# Patient Record
Sex: Female | Born: 1935
Health system: Southern US, Community
[De-identification: ages and names within clinical notes are randomized; demographics above are authoritative.]

## PROBLEM LIST (undated history)

## (undated) DIAGNOSIS — M545 Low back pain, unspecified: Secondary | ICD-10-CM

## (undated) DIAGNOSIS — R42 Dizziness and giddiness: Secondary | ICD-10-CM

## (undated) DIAGNOSIS — M949 Disorder of cartilage, unspecified: Secondary | ICD-10-CM

## (undated) DIAGNOSIS — K589 Irritable bowel syndrome without diarrhea: Secondary | ICD-10-CM

## (undated) DIAGNOSIS — E079 Disorder of thyroid, unspecified: Secondary | ICD-10-CM

## (undated) DIAGNOSIS — F02818 Dementia in other diseases classified elsewhere, unspecified severity, with other behavioral disturbance: Secondary | ICD-10-CM

## (undated) DIAGNOSIS — I10 Essential (primary) hypertension: Secondary | ICD-10-CM

## (undated) DIAGNOSIS — G309 Alzheimer's disease, unspecified: Secondary | ICD-10-CM

## (undated) DIAGNOSIS — M899 Disorder of bone, unspecified: Secondary | ICD-10-CM

## (undated) DIAGNOSIS — J209 Acute bronchitis, unspecified: Secondary | ICD-10-CM

## (undated) DIAGNOSIS — Z87440 Personal history of urinary (tract) infections: Secondary | ICD-10-CM

## (undated) DIAGNOSIS — E441 Mild protein-calorie malnutrition: Secondary | ICD-10-CM

## (undated) DIAGNOSIS — E785 Hyperlipidemia, unspecified: Secondary | ICD-10-CM

## (undated) DIAGNOSIS — F0280 Dementia in other diseases classified elsewhere without behavioral disturbance: Secondary | ICD-10-CM

## (undated) DIAGNOSIS — F411 Generalized anxiety disorder: Secondary | ICD-10-CM

## (undated) DIAGNOSIS — F429 Obsessive-compulsive disorder, unspecified: Secondary | ICD-10-CM

## (undated) DIAGNOSIS — F0281 Dementia in other diseases classified elsewhere with behavioral disturbance: Secondary | ICD-10-CM

## (undated) DIAGNOSIS — E550 Rickets, active: Secondary | ICD-10-CM

## (undated) DIAGNOSIS — F0390 Unspecified dementia without behavioral disturbance: Secondary | ICD-10-CM

## (undated) HISTORY — DX: Acute bronchitis, unspecified: J20.9

## (undated) HISTORY — DX: Dementia in other diseases classified elsewhere, unspecified severity, without behavioral disturbance, psychotic disturbance, mood disturbance, and anxiety: F02.80

## (undated) HISTORY — DX: Hyperlipidemia, unspecified: E78.5

## (undated) HISTORY — DX: Alzheimer's disease, unspecified: G30.9

## (undated) HISTORY — PX: PARTIAL HYSTERECTOMY: SHX80

## (undated) HISTORY — PX: REPLACEMENT TOTAL KNEE: SUR1224

## (undated) HISTORY — PX: THYROID SURGERY: SHX805

## (undated) HISTORY — DX: Mild protein-calorie malnutrition: E44.1

## (undated) HISTORY — DX: Generalized anxiety disorder: F41.1

## (undated) HISTORY — DX: Dizziness and giddiness: R42

## (undated) HISTORY — DX: Low back pain, unspecified: M54.50

## (undated) HISTORY — DX: Irritable bowel syndrome, unspecified: K58.9

## (undated) HISTORY — DX: Unspecified dementia, unspecified severity, without behavioral disturbance, psychotic disturbance, mood disturbance, and anxiety: F03.90

## (undated) HISTORY — DX: Disorder of bone, unspecified: M89.9

## (undated) HISTORY — DX: Low back pain: M54.5

## (undated) HISTORY — DX: Dementia in other diseases classified elsewhere, unspecified severity, with other behavioral disturbance: F02.818

## (undated) HISTORY — DX: Dementia in other diseases classified elsewhere with behavioral disturbance: F02.81

## (undated) HISTORY — DX: Personal history of urinary (tract) infections: Z87.440

## (undated) HISTORY — DX: Rickets, active: E55.0

## (undated) HISTORY — DX: Disorder of cartilage, unspecified: M94.9

## (undated) HISTORY — DX: Obsessive-compulsive disorder, unspecified: F42.9

---

## 1997-08-27 ENCOUNTER — Other Ambulatory Visit: Admission: RE | Admit: 1997-08-27 | Discharge: 1997-08-27 | Payer: Self-pay | Admitting: Gynecology

## 1997-10-23 ENCOUNTER — Ambulatory Visit (HOSPITAL_COMMUNITY): Admission: RE | Admit: 1997-10-23 | Discharge: 1997-10-23 | Payer: Self-pay | Admitting: Internal Medicine

## 1997-12-17 ENCOUNTER — Other Ambulatory Visit: Admission: RE | Admit: 1997-12-17 | Discharge: 1997-12-17 | Payer: Self-pay | Admitting: Gynecology

## 1998-04-23 ENCOUNTER — Ambulatory Visit (HOSPITAL_COMMUNITY): Admission: RE | Admit: 1998-04-23 | Discharge: 1998-04-23 | Payer: Self-pay | Admitting: *Deleted

## 1998-04-29 ENCOUNTER — Ambulatory Visit (HOSPITAL_COMMUNITY): Admission: RE | Admit: 1998-04-29 | Discharge: 1998-04-29 | Payer: Self-pay | Admitting: *Deleted

## 1998-04-29 ENCOUNTER — Encounter: Payer: Self-pay | Admitting: *Deleted

## 1998-09-14 ENCOUNTER — Encounter: Payer: Self-pay | Admitting: Internal Medicine

## 1998-09-14 ENCOUNTER — Ambulatory Visit (HOSPITAL_COMMUNITY): Admission: RE | Admit: 1998-09-14 | Discharge: 1998-09-14 | Payer: Self-pay | Admitting: Internal Medicine

## 1998-10-06 ENCOUNTER — Other Ambulatory Visit: Admission: RE | Admit: 1998-10-06 | Discharge: 1998-10-06 | Payer: Self-pay | Admitting: Gynecology

## 1999-01-30 ENCOUNTER — Emergency Department (HOSPITAL_COMMUNITY): Admission: EM | Admit: 1999-01-30 | Discharge: 1999-01-30 | Payer: Self-pay | Admitting: Emergency Medicine

## 1999-07-27 ENCOUNTER — Encounter: Admission: RE | Admit: 1999-07-27 | Discharge: 1999-10-25 | Payer: Self-pay | Admitting: Internal Medicine

## 1999-09-15 ENCOUNTER — Encounter: Payer: Self-pay | Admitting: Internal Medicine

## 1999-09-15 ENCOUNTER — Ambulatory Visit (HOSPITAL_COMMUNITY): Admission: RE | Admit: 1999-09-15 | Discharge: 1999-09-15 | Payer: Self-pay | Admitting: *Deleted

## 1999-11-08 ENCOUNTER — Other Ambulatory Visit: Admission: RE | Admit: 1999-11-08 | Discharge: 1999-11-08 | Payer: Self-pay | Admitting: Gynecology

## 1999-11-10 ENCOUNTER — Encounter: Admission: RE | Admit: 1999-11-10 | Discharge: 1999-11-10 | Payer: Self-pay | Admitting: Gynecology

## 1999-11-10 ENCOUNTER — Encounter: Payer: Self-pay | Admitting: Gynecology

## 2000-02-01 ENCOUNTER — Encounter: Payer: Self-pay | Admitting: Surgery

## 2000-02-01 ENCOUNTER — Encounter: Admission: RE | Admit: 2000-02-01 | Discharge: 2000-02-01 | Payer: Self-pay | Admitting: Surgery

## 2000-03-13 ENCOUNTER — Ambulatory Visit (HOSPITAL_COMMUNITY): Admission: RE | Admit: 2000-03-13 | Discharge: 2000-03-13 | Payer: Self-pay | Admitting: Surgery

## 2001-01-14 ENCOUNTER — Ambulatory Visit (HOSPITAL_COMMUNITY): Admission: RE | Admit: 2001-01-14 | Discharge: 2001-01-14 | Payer: Self-pay | Admitting: Gastroenterology

## 2001-01-22 ENCOUNTER — Other Ambulatory Visit: Admission: RE | Admit: 2001-01-22 | Discharge: 2001-01-22 | Payer: Self-pay | Admitting: Gynecology

## 2001-05-08 ENCOUNTER — Encounter: Payer: Self-pay | Admitting: Orthopedic Surgery

## 2001-05-13 ENCOUNTER — Inpatient Hospital Stay (HOSPITAL_COMMUNITY): Admission: RE | Admit: 2001-05-13 | Discharge: 2001-05-20 | Payer: Self-pay | Admitting: Orthopedic Surgery

## 2001-05-13 ENCOUNTER — Encounter: Payer: Self-pay | Admitting: Orthopedic Surgery

## 2001-05-16 ENCOUNTER — Encounter: Payer: Self-pay | Admitting: Orthopedic Surgery

## 2001-07-02 ENCOUNTER — Other Ambulatory Visit: Admission: RE | Admit: 2001-07-02 | Discharge: 2001-07-02 | Payer: Self-pay | Admitting: Gynecology

## 2002-04-15 ENCOUNTER — Encounter: Admission: RE | Admit: 2002-04-15 | Discharge: 2002-04-15 | Payer: Self-pay | Admitting: Gynecology

## 2002-04-15 ENCOUNTER — Encounter: Payer: Self-pay | Admitting: Gynecology

## 2002-11-02 ENCOUNTER — Emergency Department (HOSPITAL_COMMUNITY): Admission: EM | Admit: 2002-11-02 | Discharge: 2002-11-02 | Payer: Self-pay | Admitting: Emergency Medicine

## 2003-02-10 ENCOUNTER — Other Ambulatory Visit: Admission: RE | Admit: 2003-02-10 | Discharge: 2003-02-10 | Payer: Self-pay | Admitting: Gynecology

## 2003-06-17 ENCOUNTER — Encounter: Admission: RE | Admit: 2003-06-17 | Discharge: 2003-06-17 | Payer: Self-pay | Admitting: Gynecology

## 2004-01-04 ENCOUNTER — Encounter: Admission: RE | Admit: 2004-01-04 | Discharge: 2004-01-04 | Payer: Self-pay | Admitting: Internal Medicine

## 2004-03-07 ENCOUNTER — Other Ambulatory Visit: Admission: RE | Admit: 2004-03-07 | Discharge: 2004-03-07 | Payer: Self-pay | Admitting: Gynecology

## 2004-06-22 ENCOUNTER — Ambulatory Visit (HOSPITAL_COMMUNITY): Admission: RE | Admit: 2004-06-22 | Discharge: 2004-06-22 | Payer: Self-pay | Admitting: Gastroenterology

## 2004-07-15 ENCOUNTER — Encounter: Admission: RE | Admit: 2004-07-15 | Discharge: 2004-07-15 | Payer: Self-pay | Admitting: Internal Medicine

## 2004-10-08 ENCOUNTER — Emergency Department (HOSPITAL_COMMUNITY): Admission: EM | Admit: 2004-10-08 | Discharge: 2004-10-08 | Payer: Self-pay | Admitting: Emergency Medicine

## 2005-02-10 ENCOUNTER — Emergency Department (HOSPITAL_COMMUNITY): Admission: EM | Admit: 2005-02-10 | Discharge: 2005-02-11 | Payer: Self-pay | Admitting: Emergency Medicine

## 2006-08-04 ENCOUNTER — Emergency Department (HOSPITAL_COMMUNITY): Admission: EM | Admit: 2006-08-04 | Discharge: 2006-08-04 | Payer: Self-pay | Admitting: *Deleted

## 2009-03-03 LAB — HM COLONOSCOPY

## 2010-04-24 ENCOUNTER — Encounter: Payer: Self-pay | Admitting: Internal Medicine

## 2010-11-03 ENCOUNTER — Inpatient Hospital Stay (INDEPENDENT_AMBULATORY_CARE_PROVIDER_SITE_OTHER)
Admission: RE | Admit: 2010-11-03 | Discharge: 2010-11-03 | Disposition: A | Discharge status: Home / Self Care | Payer: Medicare Other | Source: Ambulatory Visit | Attending: Emergency Medicine | Admitting: Emergency Medicine

## 2010-11-03 DIAGNOSIS — R1013 Epigastric pain: Secondary | ICD-10-CM

## 2011-03-13 ENCOUNTER — Emergency Department (HOSPITAL_COMMUNITY)
Admission: EM | Admit: 2011-03-13 | Discharge: 2011-03-13 | Disposition: A | Discharge status: Home / Self Care | Payer: Medicare Other | Source: Home / Self Care

## 2011-06-27 ENCOUNTER — Emergency Department (INDEPENDENT_AMBULATORY_CARE_PROVIDER_SITE_OTHER)
Admission: EM | Admit: 2011-06-27 | Discharge: 2011-06-27 | Disposition: A | Discharge status: Home / Self Care | Payer: Medicare Other | Source: Home / Self Care

## 2011-06-27 ENCOUNTER — Encounter (HOSPITAL_COMMUNITY): Payer: Self-pay | Admitting: Emergency Medicine

## 2011-06-27 ENCOUNTER — Emergency Department (INDEPENDENT_AMBULATORY_CARE_PROVIDER_SITE_OTHER): Payer: Medicare Other

## 2011-06-27 DIAGNOSIS — J209 Acute bronchitis, unspecified: Secondary | ICD-10-CM

## 2011-06-27 HISTORY — DX: Dementia in other diseases classified elsewhere, unspecified severity, without behavioral disturbance, psychotic disturbance, mood disturbance, and anxiety: F02.80

## 2011-06-27 HISTORY — DX: Alzheimer's disease, unspecified: G30.9

## 2011-06-27 HISTORY — DX: Essential (primary) hypertension: I10

## 2011-06-27 HISTORY — DX: Irritable bowel syndrome, unspecified: K58.9

## 2011-06-27 HISTORY — DX: Disorder of thyroid, unspecified: E07.9

## 2011-06-27 MED ORDER — DOXYCYCLINE HYCLATE 100 MG PO CAPS: 100.0000 mg | ORAL_CAPSULE | Freq: Two times a day (BID) | ORAL | Status: AC

## 2011-06-27 MED ORDER — BENZONATATE 100 MG PO CAPS: ORAL_CAPSULE | ORAL | Status: AC

## 2011-06-27 NOTE — ED Notes (Signed)
Cough for 3 weeks.  Patient has been seen 3/15 and treated with z-pac and inhaler.  Her pcp cannot see until April.  Daughter reports patient is not eating as usual.  White phlegm sometimes, but production and the sound of congestion are not equal: congestion sounds worse than actual production of phlegm.

## 2011-06-27 NOTE — ED Notes (Signed)
Delay secondary to department acuity 

## 2011-06-27 NOTE — ED Provider Notes (Signed)
History     CSN: [id number]  Arrival date & time 06/27/11  1619   None     Chief Complaint  Patient presents with  . Cough    (Consider location/radiation/quality/duration/timing/severity/associated sxs/prior treatment) HPI Comments: This very pleasant Alzheimer's dementia patient presents today with her daughter. Daughter reports that she has had a cough for the last 3 weeks. She was seen on March 15 by her primary care physician and treated with a Z-Pak. Daughter states that despite treatment her cough is progressively worsening, deeper, and is nonproductive with a thick milky phlegm. No fever. Her appetite is decreased. She has chronic diarrhea do to IBS, this is unchanged. No vomiting.   Past Medical History  Diagnosis Date  . Thyroid disease   . Hypertension   . IBS (irritable bowel syndrome)   . Alzheimer disease     Past Surgical History  Procedure Date  . Thyroid surgery   . Partial hysterectomy   . Replacement total knee     bilateral    History reviewed. No pertinent family history.  History  Substance Use Topics  . Smoking status: Never Smoker   . Smokeless tobacco: Not on file  . Alcohol Use: No    OB History    Grav Para Term Preterm Abortions TAB SAB Ect Mult Living                  Review of Systems  Constitutional: Positive for appetite change. Negative for fever, chills and fatigue.  HENT: Negative for ear pain, congestion, sore throat and rhinorrhea.   Respiratory: Positive for cough. Negative for shortness of breath and wheezing.   Cardiovascular: Negative for chest pain.    Allergies  Review of patient's allergies indicates no known allergies.  Home Medications   Current Outpatient Rx  Name Route Sig Dispense Refill  . LEVOTHYROXINE SODIUM PO Oral Take by mouth.    Marland Kitchen LISINOPRIL-HYDROCHLOROTHIAZIDE 20-25 MG PO TABS Oral Take 1 tablet by mouth daily.    Marland Kitchen ZOLOFT PO Oral Take by mouth.    . BENZONATATE 100 MG PO CAPS  1-2 caps every 8  hrs prn cough 30 capsule 0  . DOXYCYCLINE HYCLATE 100 MG PO CAPS Oral Take 1 capsule (100 mg total) by mouth 2 (two) times daily. 20 capsule 0    BP 132/67  Pulse 70  Temp(Src) 98.9 F (37.2 C) (Oral)  Resp 18  SpO2 100%  Physical Exam  Nursing note and vitals reviewed. Constitutional: She appears well-developed and well-nourished. No distress.  HENT:  Head: Normocephalic and atraumatic.  Right Ear: Tympanic membrane, external ear and ear canal normal.  Left Ear: Tympanic membrane, external ear and ear canal normal.  Nose: Nose normal.  Mouth/Throat: Uvula is midline, oropharynx is clear and moist and mucous membranes are normal. No oropharyngeal exudate, posterior oropharyngeal edema or posterior oropharyngeal erythema.  Neck: Neck supple.  Cardiovascular: Normal rate, regular rhythm and normal heart sounds.   Pulmonary/Chest: Effort normal and breath sounds normal. No respiratory distress.  Lymphadenopathy:    She has no cervical adenopathy.  Neurological: She is alert.  Skin: Skin is warm and dry.  Psychiatric: She has a normal mood and affect.    ED Course  Procedures (including critical care time)  Labs Reviewed - No data to display Dg Chest 2 View  06/27/2011  *RADIOLOGY REPORT*  Clinical Data: Cough and congestion.  CHEST - 2 VIEW  Comparison: 08/04/2006 chest radiograph  Findings: Upper limits normal heart  size noted. This is a mildly low volume film with bibasilar atelectasis. There is no evidence of focal airspace disease, pulmonary edema, suspicious pulmonary nodule/mass, pleural effusion, or pneumothorax. No acute bony abnormalities are identified. Unchanged superior mediastinal prominence probably represents a goiter.  IMPRESSION: Mild bibasilar atelectasis.  Upper limits normal heart size.  Original Report Authenticated By: Rosendo Gros, M.D.     1. Acute bronchitis       MDM  CXR neg for infiltrates. Cough x 3 wks worsening. Exam neg.         Melody Comas, [place] 06/27/11 2016

## 2011-06-27 NOTE — ED Notes (Signed)
Patient denies pain and is resting comfortably.  

## 2011-06-27 NOTE — Discharge Instructions (Signed)
Chest xray neg for pneumonia. Complete antibiotic prescription. Take cough medication as needed. Increase fluids. Follow up with Dr Renato Gails next week.   Bronchitis Bronchitis is a problem of the air tubes leading to your lungs. This problem makes it hard for air to get in and out of the lungs. You may cough a lot because your air tubes are narrow. Going without care can cause lasting (chronic) bronchitis. HOME CARE   Drink enough fluids to keep your pee (urine) clear or pale yellow.   Use a cool mist humidifier.   Quit smoking if you smoke. If you keep smoking, the bronchitis might not get better.   Only take medicine as told by your doctor.  GET HELP RIGHT AWAY IF:   Coughing keeps you awake.   You start to wheeze.   You become more sick or weak.   You have a hard time breathing or get short of breath.   You cough up blood.   Coughing lasts more than 2 weeks.   You have a fever.   Your baby is older than 3 months with a rectal temperature of 102 F (38.9 C) or higher.   Your baby is 41 months old or younger with a rectal temperature of 100.4 F (38 C) or higher.  MAKE SURE YOU:  Understand these instructions.   Will watch your condition.   Will get help right away if you are not doing well or get worse.  Document Released: 09/06/2007 Document Revised: 03/09/2011 Document Reviewed: 02/19/2009 [facility] Patient Information 2012 [place], [place].

## 2011-06-27 NOTE — ED Provider Notes (Signed)
Medical screening examination/treatment/procedure(s) were performed by non-physician practitioner and as supervising physician I was immediately available for consultation/collaboration.  Alen Bleacher, MD 06/27/11 2238

## 2012-02-12 ENCOUNTER — Encounter (HOSPITAL_COMMUNITY): Payer: Self-pay | Admitting: *Deleted

## 2012-02-12 ENCOUNTER — Emergency Department (HOSPITAL_COMMUNITY)
Admission: EM | Admit: 2012-02-12 | Discharge: 2012-02-12 | Disposition: A | Discharge status: Home / Self Care | Payer: Medicare Other | Attending: Emergency Medicine | Admitting: Emergency Medicine

## 2012-02-12 ENCOUNTER — Emergency Department (HOSPITAL_COMMUNITY): Payer: Medicare Other

## 2012-02-12 DIAGNOSIS — S0101XA Laceration without foreign body of scalp, initial encounter: Secondary | ICD-10-CM

## 2012-02-12 DIAGNOSIS — Z79899 Other long term (current) drug therapy: Secondary | ICD-10-CM | POA: Insufficient documentation

## 2012-02-12 DIAGNOSIS — Y929 Unspecified place or not applicable: Secondary | ICD-10-CM | POA: Insufficient documentation

## 2012-02-12 DIAGNOSIS — I10 Essential (primary) hypertension: Secondary | ICD-10-CM | POA: Insufficient documentation

## 2012-02-12 DIAGNOSIS — Y9389 Activity, other specified: Secondary | ICD-10-CM | POA: Insufficient documentation

## 2012-02-12 DIAGNOSIS — F0280 Dementia in other diseases classified elsewhere without behavioral disturbance: Secondary | ICD-10-CM | POA: Insufficient documentation

## 2012-02-12 DIAGNOSIS — G309 Alzheimer's disease, unspecified: Secondary | ICD-10-CM | POA: Insufficient documentation

## 2012-02-12 DIAGNOSIS — W1809XA Striking against other object with subsequent fall, initial encounter: Secondary | ICD-10-CM | POA: Insufficient documentation

## 2012-02-12 DIAGNOSIS — Z8719 Personal history of other diseases of the digestive system: Secondary | ICD-10-CM | POA: Insufficient documentation

## 2012-02-12 DIAGNOSIS — Z23 Encounter for immunization: Secondary | ICD-10-CM | POA: Insufficient documentation

## 2012-02-12 DIAGNOSIS — S0100XA Unspecified open wound of scalp, initial encounter: Secondary | ICD-10-CM | POA: Insufficient documentation

## 2012-02-12 DIAGNOSIS — W19XXXA Unspecified fall, initial encounter: Secondary | ICD-10-CM

## 2012-02-12 DIAGNOSIS — E079 Disorder of thyroid, unspecified: Secondary | ICD-10-CM | POA: Insufficient documentation

## 2012-02-12 MED ORDER — TETANUS-DIPHTH-ACELL PERTUSSIS 5-2.5-18.5 LF-MCG/0.5 IM SUSP
0.5000 mL | Freq: Once | INTRAMUSCULAR | Status: AC
  Administered 2012-02-12: 0.5 mL via INTRAMUSCULAR
  Filled 2012-02-12: qty 0.5

## 2012-02-12 NOTE — ED Provider Notes (Signed)
History     CSN: [id number]  Arrival date & time 02/12/12  1905   First MD Initiated Contact with Patient 02/12/12 2148      Chief Complaint  Patient presents with  . Fall  . Head Laceration    (Consider location/radiation/quality/duration/timing/severity/associated sxs/prior treatment) Patient is a 77 y.o. female presenting with fall and scalp laceration. The history is provided by a relative. The history is limited by the condition of the patient (dementia).  Fall  Head Laceration  She went to sit down on the sofa and missed the sofa and fell striking her head on a coffee table. She several laceration. It is not known when her last tetanus immunization was. There was no loss of consciousness and her mental status is at her baseline according to her daughter. She has not had any nausea or vomiting.  Past Medical History  Diagnosis Date  . Thyroid disease   . Hypertension   . IBS (irritable bowel syndrome)   . Alzheimer disease     Past Surgical History  Procedure Date  . Thyroid surgery   . Partial hysterectomy   . Replacement total knee     bilateral    History reviewed. No pertinent family history.  History  Substance Use Topics  . Smoking status: Never Smoker   . Smokeless tobacco: Not on file  . Alcohol Use: No    OB History    Grav Para Term Preterm Abortions TAB SAB Ect Mult Living                  Review of Systems  Unable to perform ROS: Dementia    Allergies  Review of patient's allergies indicates no known allergies.  Home Medications   Current Outpatient Rx  Name  Route  Sig  Dispense  Refill  . LEVOTHYROXINE SODIUM 100 MCG PO TABS   Oral   Take 100 mcg by mouth daily.         Marland Kitchen LISINOPRIL-HYDROCHLOROTHIAZIDE 20-25 MG PO TABS   Oral   Take 1 tablet by mouth daily.         . SERTRALINE HCL 100 MG PO TABS   Oral   Take 100 mg by mouth daily.           BP 128/90  Pulse 79  Temp 98.1 F (36.7 C) (Oral)  Resp 16  SpO2  97%  Physical Exam  Nursing note and vitals reviewed. 77 year old female, resting comfortably and in no acute distress. Vital signs are normal. Oxygen saturation is 97%, which is normal. Head is normocephalic. There is a 1 cm laceration on the occiput with no other evidence of trauma present. PERRLA, EOMI. Oropharynx is clear. Neck is nontender and supple without adenopathy or JVD. Back is nontender and there is no CVA tenderness. Lungs are clear without rales, wheezes, or rhonchi. Chest is nontender. Heart has regular rate and rhythm without murmur. Abdomen is soft, flat, nontender without masses or hepatosplenomegaly and peristalsis is normoactive. Extremities have no cyanosis or edema, full range of motion is present. Skin is warm and dry without rash. Neurologic:  she is awake and alert, oriented to person but not place or time, cranial nerves are intact, there are no motor or sensory deficits.   ED Course  Procedures (including critical care time)  Ct Head Wo Contrast  02/12/2012  *RADIOLOGY REPORT*  Clinical Data: Fall, head laceration.  CT HEAD WITHOUT CONTRAST  Technique:  Contiguous axial images were obtained  from the base of the skull through the vertex without contrast.  Comparison: None.  Findings: There is atrophy and chronic small vessel disease changes.  Associated ventriculomegaly. No acute intracranial abnormality.  Specifically, no hemorrhage, hydrocephalus, mass lesion, acute infarction, or significant intracranial injury.  No acute calvarial abnormality. Visualized paranasal sinuses and mastoids clear.  Orbital soft tissues unremarkable.  IMPRESSION: No acute intracranial abnormality.  Atrophy, chronic microvascular disease.   Original Report Authenticated By: Charlett Nose, M.D.    LACERATION REPAIR Performed by: [name] Authorized by: [name] Consent: Verbal consent obtained. Risks and benefits: risks, benefits and alternatives were discussed Consent given by:  patient Patient identity confirmed: provided demographic data Prepped and Draped in normal sterile fashion Wound explored  Laceration Location: scalp  Laceration Length: 1.0 cm  No Foreign Bodies seen or palpated  Anesthesia: none  Amount of cleaning: standard  Skin closure: close  Number of staples: 2  Technique: stapling  Patient tolerance: Patient tolerated the procedure well with no immediate complications.   1. Fall   2. Laceration of scalp       MDM  Fall with scalp laceration.TdAP booster is given. CT scan will be obtained and a laceration repaired time after scan has been completed.        [name], MD 02/13/12 [phone]

## 2012-02-12 NOTE — ED Notes (Addendum)
Pt has alzhiemers. Pt daughter was cooking and pt fell from a sitting position and hit head on end of coffee table. Pt has small laceration to back of head, bleeding controlled. Pt per normal per family, no LOC.

## 2012-02-25 ENCOUNTER — Emergency Department (HOSPITAL_COMMUNITY)
Admission: EM | Admit: 2012-02-25 | Discharge: 2012-02-25 | Disposition: A | Discharge status: Home / Self Care | Payer: Medicare Other | Attending: Emergency Medicine | Admitting: Emergency Medicine

## 2012-02-25 ENCOUNTER — Encounter (HOSPITAL_COMMUNITY): Payer: Self-pay | Admitting: *Deleted

## 2012-02-25 DIAGNOSIS — I10 Essential (primary) hypertension: Secondary | ICD-10-CM | POA: Insufficient documentation

## 2012-02-25 DIAGNOSIS — E079 Disorder of thyroid, unspecified: Secondary | ICD-10-CM | POA: Insufficient documentation

## 2012-02-25 DIAGNOSIS — G309 Alzheimer's disease, unspecified: Secondary | ICD-10-CM | POA: Insufficient documentation

## 2012-02-25 DIAGNOSIS — F0280 Dementia in other diseases classified elsewhere without behavioral disturbance: Secondary | ICD-10-CM | POA: Insufficient documentation

## 2012-02-25 DIAGNOSIS — Z8719 Personal history of other diseases of the digestive system: Secondary | ICD-10-CM | POA: Insufficient documentation

## 2012-02-25 DIAGNOSIS — Z79899 Other long term (current) drug therapy: Secondary | ICD-10-CM | POA: Insufficient documentation

## 2012-02-25 DIAGNOSIS — Z4802 Encounter for removal of sutures: Secondary | ICD-10-CM | POA: Insufficient documentation

## 2012-02-25 NOTE — ED Notes (Signed)
2 staples intact in back of scalp, clean and dry- no drainage noted.

## 2012-02-25 NOTE — ED Provider Notes (Signed)
History  This chart was scribed for Candice Lyons, MD by Shari Heritage, ED Scribe. The patient was seen in room TR04C/TR04C. Patient's care was started at 1643.  CSN: [id number]  Arrival date & time 02/25/12  1613   First MD Initiated Contact with Patient 02/25/12 1643      Chief Complaint  Patient presents with  . Suture / Staple Removal    The history is provided by the patient. No language interpreter was used.    HPI Comments: Candice Woodward is a 77 y.o. female who presents to the Emergency Department requesting staple removalfrom a laceration site on the back of her head. Patient fell and sustained a laceration 2 weeks ago and staples were placed by Dr. Preston Fleeting on 02/12/12. [name] states that she has cleaned the area with peroxide and that there has been a small amount of non-purulent, bloody discharge from the area. Patient has a history of thyroid disease, HTN, IBS and Alzheimer's. Patient does not smoke.   Past Medical History  Diagnosis Date  . Thyroid disease   . Hypertension   . IBS (irritable bowel syndrome)   . Alzheimer disease     Past Surgical History  Procedure Date  . Thyroid surgery   . Partial hysterectomy   . Replacement total knee     bilateral    No family history on file.  History  Substance Use Topics  . Smoking status: Never Smoker   . Smokeless tobacco: Not on file  . Alcohol Use: No    OB History    Grav Para Term Preterm Abortions TAB SAB Ect Mult Living                  Review of Systems  Skin: Positive for wound.  All other systems reviewed and are negative.    Allergies  Review of patient's allergies indicates no known allergies.  Home Medications   Current Outpatient Rx  Name  Route  Sig  Dispense  Refill  . DIVALPROEX SODIUM 125 MG PO CPSP   Oral   Take 125 mg by mouth daily.         Marland Kitchen LEVOTHYROXINE SODIUM 100 MCG PO TABS   Oral   Take 100 mcg by mouth daily.         Marland Kitchen LISINOPRIL-HYDROCHLOROTHIAZIDE  20-25 MG PO TABS   Oral   Take 1 tablet by mouth daily.         . SERTRALINE HCL 100 MG PO TABS   Oral   Take 100 mg by mouth daily.           Triage Vitals: BP 139/81  Pulse 80  Temp 97.8 F (36.6 C) (Oral)  Resp 16  SpO2 99%  Physical Exam  Constitutional: She is oriented to person, place, and time. She appears well-developed and well-nourished. No distress.  HENT:  Head: Normocephalic and atraumatic.       2 staples on the posterior scalp. No redness. No purulent discharge.  Eyes: EOM are normal.  Cardiovascular: Normal rate.   Pulmonary/Chest: Effort normal.  Musculoskeletal: Normal range of motion.  Neurological: She is alert and oriented to person, place, and time.  Skin: Skin is warm and dry. No rash noted.  Psychiatric: She has a normal mood and affect. Her behavior is normal.    ED Course  Procedures (including critical care time) DIAGNOSTIC STUDIES: Oxygen Saturation is 99% on room air, normal by my interpretation.    COORDINATION OF CARE:  4:48 PM- Patient informed of current plan for treatment and evaluation and agrees with plan at this time. Removed two staples from the back of the head.  STAPLE REMOVAL Performed by: Candice Lyons, MD AT 4:48 pm Consent: Verbal consent obtained. Consent given by: patient Required items: required blood products, implants, devices, and special equipment available  Time out: Immediately prior to procedure a "time out" was called to verify the correct patient, procedure, equipment, support staff and site/side marked as required. Location: posterior scalp Wound Appearance: clean, no erythema  Staples Removed: 2 Patient tolerance: Patient tolerated the procedure well with no immediate complications.    No diagnosis found.    MDM  I personally performed the services described in this documentation, which was scribed in my presence. The recorded information has been reviewed and is  accurate.            Candice Lyons, MD 02/25/12 [phone]

## 2012-02-25 NOTE — ED Notes (Signed)
Patient is here for staple removal from her head.  Placed 2 weeks ago.  No changes in neuro status

## 2012-06-24 ENCOUNTER — Other Ambulatory Visit: Payer: Self-pay | Admitting: *Deleted

## 2012-06-24 MED ORDER — VITAMIN D (ERGOCALCIFEROL) 1.25 MG (50000 UNIT) PO CAPS: ORAL_CAPSULE | ORAL | Status: DC

## 2012-12-12 ENCOUNTER — Encounter: Payer: Self-pay | Admitting: *Deleted

## 2012-12-16 ENCOUNTER — Ambulatory Visit (INDEPENDENT_AMBULATORY_CARE_PROVIDER_SITE_OTHER): Payer: Medicare Other | Admitting: Internal Medicine

## 2012-12-16 VITALS — BP 150/65 | HR 112 | Temp 97.4°F | Resp 18 | Wt 159.2 lb

## 2012-12-16 DIAGNOSIS — I10 Essential (primary) hypertension: Secondary | ICD-10-CM

## 2012-12-16 DIAGNOSIS — E079 Disorder of thyroid, unspecified: Secondary | ICD-10-CM | POA: Insufficient documentation

## 2012-12-16 DIAGNOSIS — K589 Irritable bowel syndrome without diarrhea: Secondary | ICD-10-CM

## 2012-12-16 DIAGNOSIS — Z23 Encounter for immunization: Secondary | ICD-10-CM

## 2012-12-16 DIAGNOSIS — E441 Mild protein-calorie malnutrition: Secondary | ICD-10-CM | POA: Insufficient documentation

## 2012-12-16 DIAGNOSIS — F0280 Dementia in other diseases classified elsewhere without behavioral disturbance: Secondary | ICD-10-CM | POA: Insufficient documentation

## 2012-12-16 NOTE — Patient Instructions (Signed)
Referred to someone who can help you navigate the system of finding a facility.

## 2012-12-16 NOTE — Progress Notes (Signed)
Patient ID: Candice Woodward, female   DOB: [birth year], 78 y.o.   MRN: [id number] Location:  [facility] [place] [facility]  Code Status: DNR   No Known Allergies  Chief Complaint  Patient presents with  . Follow-up    HPI: Patient is a 78 y.o. black female seen in the office today for management chronic illnesses. Pt. No longer taking vitamin D or multi-vitamin d/t the size of the pill. No changes in mood from last visit, still taking zoloft.  BP has remained stable with lisinopril/hctz.  Daughter reports poor appetite, currently does not have dentures d/t patient feeding them to the dog. Still not sleeping well, will wander around the house during the night and occasionally will pull down her pants and use the bathroom beside the bed. Still hallucinating, mostly related to things she sees on TV or stating that someone said something and they didn't. Still becomes very combative when mentioning bathing. Walking is not stable but no recent falls. Daughter is considering placement, she is the sole caregiver and states her stress level is "out the roof." Feels that she can no longer give her the care she needs. She is quite fearful of staff at a facility not providing good enough care or getting into a conflict with her mom who can be combative at times, tends to be incontinent in innapropriate places and even put stool on the walls at times.     Review of Systems:  Review of Systems  Constitutional: Negative for weight loss.       Since 2011 pt. Has lost 20lbs  Respiratory: Negative for shortness of breath.   Cardiovascular: Negative for chest pain.  Gastrointestinal: Negative for diarrhea and constipation.  Psychiatric/Behavioral: Positive for hallucinations and memory loss.       Combative and verbally abusive at times.     Past Medical History  Diagnosis Date  . Thyroid disease   . Hypertension   . IBS (irritable bowel syndrome)   . Alzheimer disease   . Lumbago    . Acute bronchitis   . Rickets, active   . Disorder of bone and cartilage, unspecified   . Malnutrition of mild degree   . Other and unspecified hyperlipidemia   . Senile dementia, uncomplicated   . Dementia in conditions classified elsewhere with behavioral disturbance(294.11)   . Dementia in conditions classified elsewhere with behavioral disturbance(294.11)   . Anxiety state, unspecified   . Obsessive-compulsive disorders   . Alzheimer's disease   . Irritable bowel syndrome   . Dizziness and giddiness   . Personal history of urinary (tract) infection     Past Surgical History  Procedure Laterality Date  . Thyroid surgery    . Partial hysterectomy    . Replacement total knee      bilateral    Social History:   reports that she has never smoked. She does not have any smokeless tobacco history on file. She reports that she does not drink alcohol or use illicit drugs.  Family History  Problem Relation Age of Onset  . Aneurysm Mother     abdominal aortic  . Cancer Father     bone  . Cancer Sister     colon  . Diabetes Sister     Medications: Patient's Medications  New Prescriptions   No medications on file  Previous Medications   DIVALPROEX (DEPAKOTE SPRINKLE) 125 MG CAPSULE    Take 125 mg by mouth daily. Take twice  daily  for behavior.   LEVOTHYROXINE (SYNTHROID, LEVOTHROID) 100 MCG TABLET    Take 100 mcg by mouth daily. Take 1 tablet daily for thyroid supplement.   LISINOPRIL-HYDROCHLOROTHIAZIDE (PRINZIDE,ZESTORETIC) 20-25 MG PER TABLET    Take 1 tablet by mouth daily. Take 1 tablet by mouth daily for blood pressure.   MULTIPLE VITAMINS-MINERALS (MULTIVITAMIN WITH MINERALS) TABLET    Take 1 tablet by mouth daily.   SERTRALINE (ZOLOFT) 100 MG TABLET    Take 200 mg by mouth daily.    VITAMIN D, ERGOCALCIFEROL, (DRISDOL) 50000 UNITS CAPS    Take one tablet once a week for 12 weeks, then stop and start Vitamin d 2000 units once a day.  Modified Medications   No  medications on file  Discontinued Medications   No medications on file     Physical Exam: Filed Vitals:   12/16/12 1549  BP: 150/65  Pulse: 112  Temp: 97.4 F (36.3 C)  TempSrc: Oral  Resp: 18  Weight: 159 lb 3.2 oz (72.213 kg)  SpO2: 95%   Physical Exam  Constitutional: She appears well-developed and well-nourished.  Cardiovascular: Normal rate, regular rhythm, normal heart sounds and intact distal pulses.   Pulmonary/Chest: Effort normal and breath sounds normal.  Abdominal: Soft. Bowel sounds are normal.  Neurological: She is alert.  Skin: Skin is warm and dry.    Assessment/Plan 1. Alzheimer disease -severe, but not end stage--remains ambulatory, but is losing weight and having more failure to thrive features/frailty -information provided for assistant to help her daughter search for an appropriate memory care facility if she decides on this -continue adult enrichment center visits to help with caregiver stress, as well  2. Irritable bowel syndrome -especially with lactose containing products limiting her options for supplementing her diet--cannot take milkshakes or ice cream  3. Malnutrition of mild degree -due to failure to thrive from dementia -recommended increasing frequency of snacks to help maintain weight b/c she does not eat large meals--try increasing am oatmeal to three packets and add peanut butter to it  4. Thyroid disease -f/u labs next visit  5. Hypertension -at goal, no changes  6. Need for immunization against influenza - Flu Vaccine QUAD 36+ mos IM  Labs/tests ordered:  Will f/u depakote level and tsh next visit Next appt:  6 mos unless needed sooner

## 2012-12-17 ENCOUNTER — Encounter: Payer: Self-pay | Admitting: Internal Medicine

## 2012-12-17 DIAGNOSIS — Z23 Encounter for immunization: Secondary | ICD-10-CM

## 2013-03-20 ENCOUNTER — Other Ambulatory Visit: Payer: Self-pay | Admitting: Internal Medicine

## 2013-06-15 ENCOUNTER — Other Ambulatory Visit: Payer: Self-pay | Admitting: Internal Medicine

## 2013-06-22 ENCOUNTER — Other Ambulatory Visit: Payer: Self-pay | Admitting: Internal Medicine

## 2013-06-23 ENCOUNTER — Other Ambulatory Visit: Payer: Self-pay | Admitting: *Deleted

## 2013-08-08 ENCOUNTER — Encounter: Payer: Self-pay | Admitting: Internal Medicine

## 2013-08-08 ENCOUNTER — Ambulatory Visit (INDEPENDENT_AMBULATORY_CARE_PROVIDER_SITE_OTHER): Payer: Medicare Other | Admitting: Internal Medicine

## 2013-08-08 VITALS — BP 136/70 | HR 81 | Temp 97.8°F | Ht 66.5 in | Wt 166.0 lb

## 2013-08-08 DIAGNOSIS — I10 Essential (primary) hypertension: Secondary | ICD-10-CM

## 2013-08-08 DIAGNOSIS — K589 Irritable bowel syndrome without diarrhea: Secondary | ICD-10-CM

## 2013-08-08 DIAGNOSIS — E039 Hypothyroidism, unspecified: Secondary | ICD-10-CM | POA: Insufficient documentation

## 2013-08-08 DIAGNOSIS — F0280 Dementia in other diseases classified elsewhere without behavioral disturbance: Secondary | ICD-10-CM

## 2013-08-08 DIAGNOSIS — G309 Alzheimer's disease, unspecified: Principal | ICD-10-CM

## 2013-08-08 MED ORDER — LISINOPRIL-HYDROCHLOROTHIAZIDE 20-25 MG PO TABS: ORAL_TABLET | ORAL | Status: DC

## 2013-08-08 MED ORDER — DIVALPROEX SODIUM 125 MG PO CPSP: ORAL_CAPSULE | ORAL | Status: DC

## 2013-08-08 MED ORDER — LEVOTHYROXINE SODIUM 100 MCG PO TABS: ORAL_TABLET | ORAL | Status: DC

## 2013-08-08 MED ORDER — SERTRALINE HCL 100 MG PO TABS: ORAL_TABLET | ORAL | Status: DC

## 2013-08-08 NOTE — Progress Notes (Signed)
Patient ID: Candice Woodward, female   DOB: 02/23/1935, 79 y.o.   MRN: [id number]   Location:  [facility]  Code Status: DNR, her daughter is her HCPOA  No Known Allergies  Chief Complaint  Patient presents with  . Medical Management of Chronic Issues    6 month f/u with no recent labs  . other    still unstable in the mornings due to stiffiness; ? change in Levothyrine due to hair loss  . Immunizations    will check records for Pneumo    HPI: Patient is a 79 y.o. black female seen in the office today for medical mgt of chronic diseases especially her Alzheimer's disease--she does not accept that she is the patient so her daughter and I have to talk about her as someone else (usually her grandson).  She continues to be stiff in the mornings.  She has been having trouble with her hair falling out.  Sometimes doesn't swallow all of her pills and then they don't get into her system.    Gained a little weight.  Likes to snack.    Stiff in the mornings after sleeping curled up.     Review of Systems:  Review of Systems  Constitutional: Negative for fever, chills and weight loss.  HENT: Negative for congestion.   Eyes: Negative for blurred vision.  Respiratory: Positive for cough. Negative for shortness of breath.   Cardiovascular: Negative for chest pain and leg swelling.  Gastrointestinal: Negative for diarrhea and constipation.  Genitourinary: Negative for dysuria.  Musculoskeletal: Negative for falls.  Skin: Negative for rash.  Neurological: Negative for dizziness and loss of consciousness.  Psychiatric/Behavioral: Positive for depression and memory loss.       Mood stable lately     Past Medical History  Diagnosis Date  . Thyroid disease   . Hypertension   . IBS (irritable bowel syndrome)   . Alzheimer disease   . Lumbago   . Acute bronchitis   . Rickets, active   . Disorder of bone and cartilage, unspecified   . Malnutrition of  mild degree   . Other and unspecified hyperlipidemia   . Senile dementia, uncomplicated   . Dementia in conditions classified elsewhere with behavioral disturbance   . Dementia in conditions classified elsewhere with behavioral disturbance   . Anxiety state, unspecified   . Obsessive-compulsive disorders   . Alzheimer's disease   . Irritable bowel syndrome   . Dizziness and giddiness   . Personal history of urinary (tract) infection     Past Surgical History  Procedure Laterality Date  . Thyroid surgery    . Partial hysterectomy    . Replacement total knee      bilateral    Social History:   reports that she has never smoked. She does not have any smokeless tobacco history on file. She reports that she does not drink alcohol or use illicit drugs.  Family History  Problem Relation Age of Onset  . Aneurysm Mother     abdominal aortic  . Cancer Father     bone  . Cancer Sister     colon  . Diabetes Sister     Medications: Patient's Medications  New Prescriptions   No medications on file  Previous Medications   DIVALPROEX (DEPAKOTE SPRINKLE) 125 MG CAPSULE    TAKE ONE CAPSULE BY MOUTH TWICE A DAY FOR BEHAVIOR   LEVOTHYROXINE (SYNTHROID, LEVOTHROID) 100 MCG TABLET  TAKE 1 TABLET BY MOUTH FOR THYROID SUPPLEMENT   LISINOPRIL-HYDROCHLOROTHIAZIDE (PRINZIDE,ZESTORETIC) 20-25 MG PER TABLET    1 by mouth daily   MULTIPLE VITAMINS-MINERALS (MULTIVITAMIN WITH MINERALS) TABLET    Take 1 tablet by mouth daily.   SERTRALINE (ZOLOFT) 100 MG TABLET    TAKE 2 TABLETS BY MOUTH EVERY DAY   VITAMIN D, ERGOCALCIFEROL, (DRISDOL) 50000 UNITS CAPS    Take one tablet once a week for 12 weeks, then stop and start Vitamin d 2000 units once a day.  Modified Medications   No medications on file  Discontinued Medications   No medications on file     Physical Exam: Filed Vitals:   08/08/13 0817  BP: 136/70  Pulse: 81  Temp: 97.8 F (36.6 C)  TempSrc: Oral  Height: 5' 6.5" (1.689 m)    Weight: 166 lb (75.297 kg)  SpO2: 95%  Physical Exam  Constitutional: No distress.  Cardiovascular: Normal rate, regular rhythm, normal heart sounds and intact distal pulses.   Pulmonary/Chest: Effort normal and breath sounds normal.  Few scattered rhonchi  Abdominal: Soft. Bowel sounds are normal. She exhibits no distension and no mass. There is no tenderness.  Neurological: She is alert.  Oriented to person only  Skin: Skin is warm and dry.  Psychiatric:  Mood fluctuates quickly--at times, she is very agitated, combative, other times very sweet and calm    Labs reviewed: No recent labs  Assessment/Plan 1. Alzheimer disease with behaviors and depression -has been stable, [place] know [place] is in [place], still knows her daughter and grandson -her daughter has significant caregiver stress with anxiety and depression, but does not feel comfortable placing her mom in a facility  2. Irritable bowel syndrome -has been better lately with diarrhea  3. Hypothyroidism -will f/u TSH due to hair loss and question of whether medication is really getting into her system daily due to her dementia (does not always swallow pills)  4. Hypertension -at goal with current therapy  Labs/tests ordered: Orders Placed This Encounter  Procedures  . TSH  . CBC With differential/Platelet  . Comprehensive metabolic panel    Next appt:  6 mos for med mgt chr diseases

## 2013-08-09 LAB — CBC WITH DIFFERENTIAL
Basophils Absolute: 0 10*3/uL (ref 0.0–0.2)
Basos: 1 %
Eos: 4 %
Eosinophils Absolute: 0.2 10*3/uL (ref 0.0–0.4)
HCT: 39.9 % (ref 34.0–46.6)
Hemoglobin: 13.3 g/dL (ref 11.1–15.9)
Immature Grans (Abs): 0 10*3/uL (ref 0.0–0.1)
Immature Granulocytes: 0 %
Lymphocytes Absolute: 1.2 10*3/uL (ref 0.7–3.1)
Lymphs: 23 %
MCH: 29.4 pg (ref 26.6–33.0)
MCHC: 33.3 g/dL (ref 31.5–35.7)
MCV: 88 fL (ref 79–97)
Monocytes Absolute: 0.3 10*3/uL (ref 0.1–0.9)
Monocytes: 6 %
Neutrophils Absolute: 3.5 10*3/uL (ref 1.4–7.0)
Neutrophils Relative %: 66 %
Platelets: 246 10*3/uL (ref 150–379)
RBC: 4.52 x10E6/uL (ref 3.77–5.28)
RDW: 14.7 % (ref 12.3–15.4)
WBC: 5.3 10*3/uL (ref 3.4–10.8)

## 2013-08-09 LAB — COMPREHENSIVE METABOLIC PANEL
ALT: 12 IU/L (ref 0–32)
AST: 21 IU/L (ref 0–40)
Albumin/Globulin Ratio: 1.4 (ref 1.1–2.5)
Albumin: 3.9 g/dL (ref 3.5–4.8)
Alkaline Phosphatase: 84 IU/L (ref 39–117)
BUN/Creatinine Ratio: 13 (ref 11–26)
BUN: 14 mg/dL (ref 8–27)
CO2: 24 mmol/L (ref 18–29)
Calcium: 9.6 mg/dL (ref 8.7–10.3)
Chloride: 102 mmol/L (ref 97–108)
Creatinine, Ser: 1.09 mg/dL — ABNORMAL HIGH (ref 0.57–1.00)
GFR calc Af Amer: 57 mL/min/{1.73_m2} — ABNORMAL LOW (ref >59)
GFR calc non Af Amer: 49 mL/min/{1.73_m2} — ABNORMAL LOW (ref >59)
Globulin, Total: 2.8 g/dL (ref 1.5–4.5)
Glucose: 98 mg/dL (ref 65–99)
Potassium: 3.9 mmol/L (ref 3.5–5.2)
Sodium: 144 mmol/L (ref 134–144)
Total Bilirubin: 0.3 mg/dL (ref 0.0–1.2)
Total Protein: 6.7 g/dL (ref 6.0–8.5)

## 2013-08-09 LAB — TSH: TSH: 9.03 u[IU]/mL — ABNORMAL HIGH (ref 0.450–4.500)

## 2013-08-12 ENCOUNTER — Encounter: Payer: Self-pay | Admitting: *Deleted

## 2013-12-31 ENCOUNTER — Telehealth: Payer: Self-pay | Admitting: Internal Medicine

## 2013-12-31 NOTE — Telephone Encounter (Signed)
Bland SpanLois Woodward daughter dropped off a day care form to be filled out by Dr. Renato Gailseed. The form was put in the rx box on 12/31/2013.

## 2013-12-31 NOTE — Telephone Encounter (Signed)
Filled out portion that I could fill out and left for Dr. Renato Gailseed to review and sign.

## 2014-01-08 NOTE — Telephone Encounter (Signed)
Daughter stopped by the off and left Dr. Renato Gailseed a message stating "Help Please, Mom attends adult day care and if her physical and paperwork are not returned by 01/24/2214, they will suspend her care. I am not able to pay a private sitter because non cannot be left alone all day. She requires constant observation"  Given note to Dr. Renato Gailseed to address.

## 2014-01-08 NOTE — Telephone Encounter (Signed)
Candice Woodward Spoke with patient due to Dr. Renato Gailseed informed her to have daughter drop off paperwork and she will fill it out for patient but they are to keep the appointment in November.

## 2014-01-09 NOTE — Telephone Encounter (Signed)
Paperwork fill out completely and left for Dr. Renato Gailseed to sign.

## 2014-01-15 ENCOUNTER — Other Ambulatory Visit: Payer: Self-pay | Admitting: Internal Medicine

## 2014-01-29 ENCOUNTER — Other Ambulatory Visit: Payer: Self-pay | Admitting: Internal Medicine

## 2014-02-12 ENCOUNTER — Ambulatory Visit (INDEPENDENT_AMBULATORY_CARE_PROVIDER_SITE_OTHER): Payer: Medicare Other | Admitting: Internal Medicine

## 2014-02-12 ENCOUNTER — Encounter: Payer: Self-pay | Admitting: Internal Medicine

## 2014-02-12 ENCOUNTER — Ambulatory Visit (INDEPENDENT_AMBULATORY_CARE_PROVIDER_SITE_OTHER): Payer: Medicare Other | Admitting: *Deleted

## 2014-02-12 VITALS — BP 120/60 | HR 101 | Temp 96.5°F | Ht 66.5 in | Wt 166.0 lb

## 2014-02-12 DIAGNOSIS — R159 Full incontinence of feces: Secondary | ICD-10-CM

## 2014-02-12 DIAGNOSIS — R32 Unspecified urinary incontinence: Secondary | ICD-10-CM

## 2014-02-12 DIAGNOSIS — F0280 Dementia in other diseases classified elsewhere without behavioral disturbance: Secondary | ICD-10-CM

## 2014-02-12 DIAGNOSIS — G309 Alzheimer's disease, unspecified: Secondary | ICD-10-CM

## 2014-02-12 DIAGNOSIS — Z23 Encounter for immunization: Secondary | ICD-10-CM

## 2014-02-12 DIAGNOSIS — K589 Irritable bowel syndrome without diarrhea: Secondary | ICD-10-CM

## 2014-02-12 DIAGNOSIS — E441 Mild protein-calorie malnutrition: Secondary | ICD-10-CM

## 2014-02-12 DIAGNOSIS — E038 Other specified hypothyroidism: Secondary | ICD-10-CM

## 2014-02-12 DIAGNOSIS — I10 Essential (primary) hypertension: Secondary | ICD-10-CM

## 2014-02-12 MED ORDER — ADULT MULTIVITAMIN LIQUID CH: 5.0000 mL | Freq: Every day | ORAL | Status: DC

## 2014-02-12 MED ORDER — VITAMIN D 50 MCG (2000 UT) PO TABS: 2000.0000 [IU] | ORAL_TABLET | Freq: Every day | ORAL | Status: DC

## 2014-02-12 NOTE — Progress Notes (Signed)
Patient ID: Candice Woodward, female   DOB: 07/03/1934, 79 y.o.   MRN: [id number]   Location:  [facility]  Code Status: DNR  No Known Allergies  Chief Complaint  Patient presents with  . Follow-up    6 month Follow Up    HPI: Patient is a 79 y.o.  seen in the office today for medical mgt chronic diseases.    Sleeping a lot.   Has a cough.  Snores at night.  Has come congestion in the morning.   Deteriorating.  Now having fecal incontinence.  Using depends since the beginning of the year.   Vitamin D too large with weekly pills.  Discussed that D3 is daily pill.  Also discussed mvi liquids.    Review of Systems:  Review of Systems  Constitutional: Positive for weight loss and malaise/fatigue. Negative for fever and chills.  HENT: Positive for congestion.        Cough with postnasal drip in am  Respiratory: Negative for cough and shortness of breath.   Cardiovascular: Negative for chest pain, palpitations and leg swelling.  Gastrointestinal: Negative for abdominal pain, blood in stool and melena.       Fecal incontinence new  Genitourinary: Positive for urgency. Negative for dysuria and frequency.       Urinary incontinence  Musculoskeletal: Positive for falls.  Skin: Negative for rash.  Neurological: Positive for weakness. Negative for dizziness.  Psychiatric/Behavioral: Positive for depression and memory loss.       Delusional and psychotic, very confused today, required assistance to hold her still during blood draw     Past Medical History  Diagnosis Date  . Thyroid disease   . Hypertension   . IBS (irritable bowel syndrome)   . Alzheimer disease   . Lumbago   . Acute bronchitis   . Rickets, active   . Disorder of bone and cartilage, unspecified   . Malnutrition of mild degree   . Other and unspecified hyperlipidemia   . Senile dementia, uncomplicated   . Dementia in conditions classified elsewhere with behavioral  disturbance   . Dementia in conditions classified elsewhere with behavioral disturbance   . Anxiety state, unspecified   . Obsessive-compulsive disorders   . Alzheimer's disease   . Irritable bowel syndrome   . Dizziness and giddiness   . Personal history of urinary (tract) infection     Past Surgical History  Procedure Laterality Date  . Thyroid surgery    . Partial hysterectomy    . Replacement total knee      bilateral    Social History:   reports that Candice Woodward has never smoked. Candice Woodward does not have any smokeless tobacco history on file. Candice Woodward reports that Candice Woodward does not drink alcohol or use illicit drugs.  Family History  Problem Relation Age of Onset  . Aneurysm Mother     abdominal aortic  . Cancer Father     bone  . Cancer Sister     colon  . Diabetes Sister     Medications: Patient's Medications  New Prescriptions   No medications on file  Previous Medications   DIVALPROEX (DEPAKOTE SPRINKLE) 125 MG CAPSULE    TAKE ONE CAPSULE BY MOUTH TWICE A DAY FOR BEHAVIOR   LEVOTHYROXINE (SYNTHROID, LEVOTHROID) 100 MCG TABLET    TAKE 1 TABLET BY MOUTH FOR THYROID SUPPLEMENT   LISINOPRIL-HYDROCHLOROTHIAZIDE (PRINZIDE,ZESTORETIC) 20-25 MG PER TABLET    TAKE 1 TABLET BY MOUTH EVERY DAY  MULTIPLE VITAMINS-MINERALS (MULTIVITAMIN WITH MINERALS) TABLET    Take 1 tablet by mouth daily.   SERTRALINE (ZOLOFT) 100 MG TABLET    TAKE 2 TABLETS BY MOUTH EVERY DAY   VITAMIN D, ERGOCALCIFEROL, (DRISDOL) 50000 UNITS CAPS    Take one tablet once a week for 12 weeks, then stop and start Vitamin d 2000 units once a day.  Modified Medications   No medications on file  Discontinued Medications   No medications on file     Physical Exam: Filed Vitals:   02/12/14 0920  BP: 120/60  Pulse: 101  Temp: 96.5 F (35.8 C)  TempSrc: Oral  Height: 5' 6.5" (1.689 m)  Weight: 166 lb (75.297 kg)  SpO2: 95%  Physical Exam  Constitutional: Candice Woodward appears well-developed and well-nourished. No distress.    Cardiovascular: Normal rate, regular rhythm, normal heart sounds and intact distal pulses.   Pulmonary/Chest: Effort normal and breath sounds normal.  Abdominal: Soft. Bowel sounds are normal. Candice Woodward exhibits no distension and no mass. There is no tenderness.  Neurological: Candice Woodward is alert.  More confused than previously, difficult to examine her left hand--has arthritis  Skin: Skin is warm and dry.  Psychiatric:  Is happy though;  Always wants to be in charge and her daughter has to pretend Candice Woodward is the one with the appt for pt to come     Labs reviewed: Basic Metabolic Panel:  Recent Labs  09/81/1903/11/16 1135  NA 144  K 3.9  CL 102  CO2 24  GLUCOSE 98  BUN 14  CREATININE 1.09*  CALCIUM 9.6  TSH 9.030*   Liver Function Tests:  Recent Labs  08/08/13 1135  AST 21  ALT 12  ALKPHOS 84  BILITOT 0.3  PROT 6.7   No results for input(s): LIPASE, AMYLASE in the last 8760 hours. No results for input(s): AMMONIA in the last 8760 hours. CBC:  Recent Labs  08/08/13 1135  WBC 5.3  NEUTROABS 3.5  HGB 13.3  HCT 39.9  MCV 88  PLT 246   Assessment/Plan 1. Alzheimer disease -cont depakote mood stabilizer low dose--capsule opened on food -is progressing -her daughter remains resistant to putting her in a snf  2. Irritable bowel syndrome -diarrhea predominant  3. Urinary and fecal incontinence -fecal incontinence has now become a consistent problem--depends script provided--need to determine where Candice Woodward can get these at a discounted rate  4. Other specified hypothyroidism -cont synthroid 15525mcg--takes this small pill w/o a problem - TSH  5. Essential hypertension -at goal with lisinopril hctz - CBC With differential/Platelet - Basic metabolic panel  6. Malnutrition of mild degree - intake fair, not taking pill mvi so change to liquid and also change vit d to smaller daily capsule from weekly supplement - Multiple Vitamin (MULTIVITAMIN) LIQD; Take 5 mLs by mouth daily.   Dispense: 870 mL; Refill: 0 - CBC With differential/Platelet - Basic metabolic panel  7. Need for immunization against influenza -flu shot given  Labs/tests ordered: Orders Placed This Encounter  Procedures  . CBC With differential/Platelet  . Basic metabolic panel  . TSH   Discussed daughter's caregivers stress.  Candice Woodward will bring copy of hcpoa also  Next appt:  6 mos  Orland Visconti L. Luma Clopper, D.O. Geriatrics [facility] [address] [place] [place], [place] [address] Cell Phone (Mon-Fri 8am-5pm):  [phone] On Call:  [phone] & follow prompts after 5pm & weekends Office Phone:  [phone] Office Fax:  [phone]

## 2014-02-12 NOTE — Patient Instructions (Signed)
Please send a copy of your advance directive.

## 2014-02-13 ENCOUNTER — Ambulatory Visit: Payer: Medicare Other | Admitting: Internal Medicine

## 2014-02-13 LAB — BASIC METABOLIC PANEL
BUN/Creatinine Ratio: 22 (ref 11–26)
BUN: 25 mg/dL (ref 8–27)
CO2: 24 mmol/L (ref 18–29)
Calcium: 9.3 mg/dL (ref 8.7–10.3)
Chloride: 102 mmol/L (ref 97–108)
Creatinine, Ser: 1.12 mg/dL — ABNORMAL HIGH (ref 0.57–1.00)
GFR calc Af Amer: 55 mL/min/{1.73_m2} — ABNORMAL LOW (ref >59)
GFR calc non Af Amer: 48 mL/min/{1.73_m2} — ABNORMAL LOW (ref >59)
Glucose: 68 mg/dL (ref 65–99)
Potassium: 3.5 mmol/L (ref 3.5–5.2)
Sodium: 144 mmol/L (ref 134–144)

## 2014-02-13 LAB — CBC WITH DIFFERENTIAL
Basophils Absolute: 0 10*3/uL (ref 0.0–0.2)
Basos: 0 %
Eos: 3 %
Eosinophils Absolute: 0.2 10*3/uL (ref 0.0–0.4)
HCT: 38.1 % (ref 34.0–46.6)
Hemoglobin: 13.2 g/dL (ref 11.1–15.9)
Immature Grans (Abs): 0 10*3/uL (ref 0.0–0.1)
Immature Granulocytes: 0 %
Lymphocytes Absolute: 1.6 10*3/uL (ref 0.7–3.1)
Lymphs: 21 %
MCH: 30.5 pg (ref 26.6–33.0)
MCHC: 34.6 g/dL (ref 31.5–35.7)
MCV: 88 fL (ref 79–97)
Monocytes Absolute: 0.5 10*3/uL (ref 0.1–0.9)
Monocytes: 7 %
Neutrophils Absolute: 5.1 10*3/uL (ref 1.4–7.0)
Neutrophils Relative %: 69 %
Platelets: 264 10*3/uL (ref 150–379)
RBC: 4.33 x10E6/uL (ref 3.77–5.28)
RDW: 14.9 % (ref 12.3–15.4)
WBC: 7.4 10*3/uL (ref 3.4–10.8)

## 2014-02-13 LAB — TSH: TSH: 1.08 u[IU]/mL (ref 0.450–4.500)

## 2014-02-16 ENCOUNTER — Ambulatory Visit: Payer: Medicare Other | Admitting: Internal Medicine

## 2014-02-17 ENCOUNTER — Encounter: Payer: Self-pay | Admitting: *Deleted

## 2014-03-09 ENCOUNTER — Ambulatory Visit: Payer: Medicare Other | Admitting: Internal Medicine

## 2014-04-03 ENCOUNTER — Other Ambulatory Visit: Payer: Self-pay | Admitting: Internal Medicine

## 2014-04-27 ENCOUNTER — Other Ambulatory Visit: Payer: Self-pay | Admitting: Internal Medicine

## 2014-06-11 ENCOUNTER — Emergency Department (HOSPITAL_COMMUNITY): Payer: Medicare Other

## 2014-06-11 ENCOUNTER — Encounter (HOSPITAL_COMMUNITY): Payer: Self-pay

## 2014-06-11 ENCOUNTER — Emergency Department (HOSPITAL_COMMUNITY)
Admission: EM | Admit: 2014-06-11 | Discharge: 2014-06-11 | Disposition: A | Discharge status: Home / Self Care | Payer: Medicare Other | Attending: Emergency Medicine | Admitting: Emergency Medicine

## 2014-06-11 DIAGNOSIS — Z8709 Personal history of other diseases of the respiratory system: Secondary | ICD-10-CM | POA: Insufficient documentation

## 2014-06-11 DIAGNOSIS — F42 Obsessive-compulsive disorder: Secondary | ICD-10-CM | POA: Insufficient documentation

## 2014-06-11 DIAGNOSIS — I10 Essential (primary) hypertension: Secondary | ICD-10-CM | POA: Insufficient documentation

## 2014-06-11 DIAGNOSIS — F411 Generalized anxiety disorder: Secondary | ICD-10-CM | POA: Diagnosis not present

## 2014-06-11 DIAGNOSIS — Z8739 Personal history of other diseases of the musculoskeletal system and connective tissue: Secondary | ICD-10-CM | POA: Diagnosis not present

## 2014-06-11 DIAGNOSIS — Y9289 Other specified places as the place of occurrence of the external cause: Secondary | ICD-10-CM | POA: Insufficient documentation

## 2014-06-11 DIAGNOSIS — F0281 Dementia in other diseases classified elsewhere with behavioral disturbance: Secondary | ICD-10-CM | POA: Diagnosis not present

## 2014-06-11 DIAGNOSIS — W19XXXA Unspecified fall, initial encounter: Secondary | ICD-10-CM

## 2014-06-11 DIAGNOSIS — Z87440 Personal history of urinary (tract) infections: Secondary | ICD-10-CM | POA: Diagnosis not present

## 2014-06-11 DIAGNOSIS — G309 Alzheimer's disease, unspecified: Secondary | ICD-10-CM | POA: Diagnosis not present

## 2014-06-11 DIAGNOSIS — W01198A Fall on same level from slipping, tripping and stumbling with subsequent striking against other object, initial encounter: Secondary | ICD-10-CM | POA: Insufficient documentation

## 2014-06-11 DIAGNOSIS — Y998 Other external cause status: Secondary | ICD-10-CM | POA: Diagnosis not present

## 2014-06-11 DIAGNOSIS — S0990XA Unspecified injury of head, initial encounter: Secondary | ICD-10-CM | POA: Diagnosis present

## 2014-06-11 DIAGNOSIS — S0083XA Contusion of other part of head, initial encounter: Secondary | ICD-10-CM | POA: Insufficient documentation

## 2014-06-11 DIAGNOSIS — E079 Disorder of thyroid, unspecified: Secondary | ICD-10-CM | POA: Diagnosis not present

## 2014-06-11 DIAGNOSIS — S0093XA Contusion of unspecified part of head, initial encounter: Secondary | ICD-10-CM

## 2014-06-11 DIAGNOSIS — Z8719 Personal history of other diseases of the digestive system: Secondary | ICD-10-CM | POA: Diagnosis not present

## 2014-06-11 DIAGNOSIS — Y9389 Activity, other specified: Secondary | ICD-10-CM | POA: Diagnosis not present

## 2014-06-11 NOTE — Discharge Instructions (Signed)
Fall Prevention and Home Safety °Falls cause injuries and can affect all age groups. It is possible to use preventive measures to significantly decrease the likelihood of falls. There are many simple measures which can make your home safer and prevent falls. °OUTDOORS °· Repair cracks and edges of walkways and driveways. °· Remove high doorway thresholds. °· Trim shrubbery on the main path into your home. °· Have good outside lighting. °· Clear walkways of tools, rocks, debris, and clutter. °· Check that handrails are not broken and are securely fastened. Both sides of steps should have handrails. °· Have leaves, snow, and ice cleared regularly. °· Use sand or salt on walkways during winter months. °· In the garage, clean up grease or oil spills. °BATHROOM °· Install night lights. °· Install grab bars by the toilet and in the tub and shower. °· Use non-skid mats or decals in the tub or shower. °· Place a plastic non-slip stool in the shower to sit on, if needed. °· Keep floors dry and clean up all water on the floor immediately. °· Remove soap buildup in the tub or shower on a regular basis. °· Secure bath mats with non-slip, double-sided rug tape. °· Remove throw rugs and tripping hazards from the floors. °BEDROOMS °· Install night lights. °· Make sure a bedside light is easy to reach. °· Do not use oversized bedding. °· Keep a telephone by your bedside. °· Have a firm chair with side arms to use for getting dressed. °· Remove throw rugs and tripping hazards from the floor. °KITCHEN °· Keep handles on pots and pans turned toward the center of the stove. Use back burners when possible. °· Clean up spills quickly and allow time for drying. °· Avoid walking on wet floors. °· Avoid hot utensils and knives. °· Position shelves so they are not too high or low. °· Place commonly used objects within easy reach. °· If necessary, use a sturdy step stool with a grab bar when reaching. °· Keep electrical cables out of the  way. °· Do not use floor polish or wax that makes floors slippery. If you must use wax, use non-skid floor wax. °· Remove throw rugs and tripping hazards from the floor. °STAIRWAYS °· Never leave objects on stairs. °· Place handrails on both sides of stairways and use them. Fix any loose handrails. Make sure handrails on both sides of the stairways are as long as the stairs. °· Check carpeting to make sure it is firmly attached along stairs. Make repairs to worn or loose carpet promptly. °· Avoid placing throw rugs at the top or bottom of stairways, or properly secure the rug with carpet tape to prevent slippage. Get rid of throw rugs, if possible. °· Have an electrician put in a light switch at the top and bottom of the stairs. °OTHER FALL PREVENTION TIPS °· Wear low-heel or rubber-soled shoes that are supportive and fit well. Wear closed toe shoes. °· When using a stepladder, make sure it is fully opened and both spreaders are firmly locked. Do not climb a closed stepladder. °· Add color or contrast paint or tape to grab bars and handrails in your home. Place contrasting color strips on first and last steps. °· Learn and use mobility aids as needed. Install an electrical emergency response system. °· Turn on lights to avoid dark areas. Replace light bulbs that burn out immediately. Get light switches that glow. °· Arrange furniture to create clear pathways. Keep furniture in the same place. °·   Firmly attach carpet with non-skid or double-sided tape. °· Eliminate uneven floor surfaces. °· Select a carpet pattern that does not visually hide the edge of steps. °· Be aware of all pets. °OTHER HOME SAFETY TIPS °· Set the water temperature for 120° F (48.8° C). °· Keep emergency numbers on or near the telephone. °· Keep smoke detectors on every level of the home and near sleeping areas. °Document Released: 03/10/2002 Document Revised: 09/19/2011 Document Reviewed: 06/09/2011 °Exit[employer]® Patient Information ©2015  [employer]. This information is not intended to replace advice given to you by your health care provider. Make sure you discuss any questions you have with your health care provider. ° °Facial or Scalp Contusion °A facial or scalp contusion is a deep bruise on the face or head. Injuries to the face and head generally cause a lot of swelling, especially around the eyes. Contusions are the result of an injury that caused bleeding under the skin. The contusion may turn blue, purple, or yellow. Minor injuries will give you a painless contusion, but more severe contusions may stay painful and swollen for a few weeks.  °CAUSES  °A facial or scalp contusion is caused by a blunt injury or trauma to the face or head area.  °SIGNS AND SYMPTOMS  °· Swelling of the injured area.   °· Discoloration of the injured area.   °· Tenderness, soreness, or pain in the injured area.   °DIAGNOSIS  °The diagnosis can be made by taking a medical history and doing a physical exam. An X-ray exam, CT scan, or MRI may be needed to determine if there are any associated injuries, such as broken bones (fractures). °TREATMENT  °Often, the best treatment for a facial or scalp contusion is applying cold compresses to the injured area. Over-the-counter medicines may also be recommended for pain control.  °HOME CARE INSTRUCTIONS  °· Only take over-the-counter or prescription medicines as directed by your health care provider.   °· Apply ice to the injured area.   °¨ Put ice in a plastic bag.   °¨ Place a towel between your skin and the bag.   °¨ Leave the ice on for 20 minutes, 2-3 times a day.   °SEEK MEDICAL CARE IF: °· You have bite problems.   °· You have pain with chewing.   °· You are concerned about facial defects. °SEEK IMMEDIATE MEDICAL CARE IF: °· You have severe pain or a headache that is not relieved by medicine.   °· You have unusual sleepiness, confusion, or personality changes.   °· You throw up (vomit).   °· You have a persistent  nosebleed.   °· You have double vision or blurred vision.   °· You have fluid drainage from your nose or ear.   °· You have difficulty walking or using your arms or legs.   °MAKE SURE YOU:  °· Understand these instructions. °· Will watch your condition. °· Will get help right away if you are not doing well or get worse. °Document Released: 04/27/2004 Document Revised: 01/08/2013 Document Reviewed: 10/31/2012 °ExitCare® Patient Information ©2015 [employer]. This information is not intended to replace advice given to you by your health care provider. Make sure you discuss any questions you have with your health care provider. ° °

## 2014-06-11 NOTE — ED Provider Notes (Signed)
CSN: [id number]     Arrival date & time 06/11/14  1357 History   First MD Initiated Contact with Patient 06/11/14 1455     Chief Complaint  Patient presents with  . Fall     Patient is a 80 y.o. female presenting with fall. The history is provided by a relative. No language interpreter was used.  Fall   Candice Woodward presents for evaluation of injuries following a fall.  [facility]  She went to sit down thinking she was sitting on a chair and sat on a table instead.  This caused her to fall, striking her head.  She has no current complaints and had no LOC.  She does not take any blood thinners.  Level V caveat due to dementia.    Past Medical History  Diagnosis Date  . Thyroid disease   . Hypertension   . IBS (irritable bowel syndrome)   . Alzheimer disease   . Lumbago   . Acute bronchitis   . Rickets, active   . Disorder of bone and cartilage, unspecified   . Malnutrition of mild degree   . Other and unspecified hyperlipidemia   . Senile dementia, uncomplicated   . Dementia in conditions classified elsewhere with behavioral disturbance   . Dementia in conditions classified elsewhere with behavioral disturbance   . Anxiety state, unspecified   . Obsessive-compulsive disorders   . Alzheimer's disease   . Irritable bowel syndrome   . Dizziness and giddiness   . Personal history of urinary (tract) infection    Past Surgical History  Procedure Laterality Date  . Thyroid surgery    . Partial hysterectomy    . Replacement total knee      bilateral   Family History  Problem Relation Age of Onset  . Aneurysm Mother     abdominal aortic  . Cancer Father     bone  . Cancer Sister     colon  . Diabetes Sister    History  Substance Use Topics  . Smoking status: Never Smoker   . Smokeless tobacco: Not on file  . Alcohol Use: No   OB History    No data available     Review of Systems  All other systems reviewed and are  negative.     Allergies  Review of patient's allergies indicates no known allergies.  Home Medications   Prior to Admission medications   Medication Sig Start Date End Date Taking? Authorizing Provider  Cholecalciferol (VITAMIN D) 2000 UNITS tablet Take 1 tablet (2,000 Units total) by mouth daily. 02/12/14  Yes Tiffany L Reed, DO  divalproex (DEPAKOTE SPRINKLE) 125 MG capsule TAKE ONE CAPSULE BY MOUTH TWICE A DAY FOR BEHAVIOR 04/27/14  Yes Tiffany L Reed, DO  levothyroxine (SYNTHROID, LEVOTHROID) 100 MCG tablet TAKE 1 TABLET BY MOUTH FOR THYROID SUPPLEMENT 04/06/14  Yes Tiffany L Reed, DO  lisinopril-hydrochlorothiazide (PRINZIDE,ZESTORETIC) 20-25 MG per tablet TAKE 1 TABLET BY MOUTH EVERY DAY 01/30/14  Yes Sharon Seller, NP  Multiple Vitamin (MULTIVITAMIN) LIQD Take 5 mLs by mouth daily. 02/12/14  Yes Tiffany L Reed, DO  sertraline (ZOLOFT) 100 MG tablet TAKE 2 TABLETS BY MOUTH EVERY DAY 08/08/13  Yes Tiffany L Reed, DO   There were no vitals taken for this visit. Physical Exam  Constitutional: She appears well-developed and well-nourished.  HENT:  Head: Normocephalic.  Small amount of swelling to left forehead.    Eyes: Pupils are equal,  round, and reactive to light.  Cardiovascular: Normal rate and regular rhythm.   No murmur heard. Pulmonary/Chest: Effort normal and breath sounds normal. No respiratory distress.  Abdominal: Soft. There is no tenderness. There is no rebound and no guarding.  Musculoskeletal: She exhibits no edema or tenderness.  No C/T/L spine tenderness.  MAE fully.  No hip tenderness  Neurological: She is alert.  Disoriented to place and time.    Skin: Skin is warm and dry.  Psychiatric: She has a normal mood and affect. Her behavior is normal.  Nursing note and vitals reviewed.   ED Course  Procedures (including critical care time) Labs Review Labs Reviewed - No data to display  Imaging Review Ct Head Wo Contrast  06/11/2014   CLINICAL DATA:  Fall  today.  Head trauma.  Initial encounter.  EXAM: CT HEAD WITHOUT CONTRAST  TECHNIQUE: Contiguous axial images were obtained from the base of the skull through the vertex without intravenous contrast.  COMPARISON:  None.  FINDINGS: Scout images appear within normal limits. Mild motion artifact is present over the skullbase. This mildly degrades the study. This also affects the inferior posterior fossa.  No mass lesion, mass effect, midline shift, hydrocephalus, hemorrhage. No acute territorial cortical ischemia/infarct. Atrophy and chronic ischemic white matter disease is present.  Calvarium intact. Aplastic frontal sinuses. Mild RIGHT maxillary mucosal thickening.  IMPRESSION: Atrophy and chronic ischemic white matter disease without an acute intracranial abnormality. Study mildly degraded by motion artifact.   Electronically Signed   By: Candice NewportGeoffrey  Lamke M.D.   On: 06/11/2014 16:24     EKG Interpretation None      MDM   Final diagnoses:  Fall, initial encounter  Head contusion, initial encounter    Patient here for evaluation of injuries following a mechanical fall. Patient with no complaints in the emergency department and she is at her neurologic baseline. CT scan without any evidence of acute intracranial abnormality. Patient is able to stand and ambulate at her baseline. Discussed with daughter and care for injuries following a mechanical fall as well as close return precautions.    Tilden FossaElizabeth Lagena Strand, MD 06/11/14 [id number]

## 2014-06-11 NOTE — ED Notes (Signed)
[facility] from [facility].  EMS reports pt going to sit down on what she thought was a chair but it was a table instead.  Pt fell and hit her head.  -LOC, -neck or back pain, - blood thinners.

## 2014-06-11 NOTE — ED Notes (Signed)
Pt ambulated in room without difficulty or c/o pain.

## 2014-06-19 ENCOUNTER — Other Ambulatory Visit: Payer: Self-pay | Admitting: Internal Medicine

## 2014-07-07 ENCOUNTER — Other Ambulatory Visit: Payer: Self-pay | Admitting: Nurse Practitioner

## 2014-08-13 ENCOUNTER — Ambulatory Visit (INDEPENDENT_AMBULATORY_CARE_PROVIDER_SITE_OTHER): Payer: Medicare Other | Admitting: Internal Medicine

## 2014-08-13 ENCOUNTER — Encounter: Payer: Self-pay | Admitting: Internal Medicine

## 2014-08-13 VITALS — BP 108/66 | HR 93 | Temp 96.5°F | Wt 165.0 lb

## 2014-08-13 DIAGNOSIS — Z23 Encounter for immunization: Secondary | ICD-10-CM | POA: Diagnosis not present

## 2014-08-13 DIAGNOSIS — Z86010 Personal history of colonic polyps: Secondary | ICD-10-CM

## 2014-08-13 DIAGNOSIS — K589 Irritable bowel syndrome without diarrhea: Secondary | ICD-10-CM | POA: Diagnosis not present

## 2014-08-13 DIAGNOSIS — G309 Alzheimer's disease, unspecified: Secondary | ICD-10-CM | POA: Diagnosis not present

## 2014-08-13 DIAGNOSIS — I10 Essential (primary) hypertension: Secondary | ICD-10-CM

## 2014-08-13 DIAGNOSIS — E038 Other specified hypothyroidism: Secondary | ICD-10-CM | POA: Diagnosis not present

## 2014-08-13 DIAGNOSIS — Z5181 Encounter for therapeutic drug level monitoring: Secondary | ICD-10-CM

## 2014-08-13 DIAGNOSIS — E441 Mild protein-calorie malnutrition: Secondary | ICD-10-CM | POA: Diagnosis not present

## 2014-08-13 DIAGNOSIS — F0280 Dementia in other diseases classified elsewhere without behavioral disturbance: Secondary | ICD-10-CM

## 2014-08-13 MED ORDER — ADULT MULTIVITAMIN LIQUID CH: 5.0000 mL | Freq: Every day | ORAL | Status: DC

## 2014-08-13 NOTE — Progress Notes (Signed)
Patient ID: Candice Woodward, female   DOB: 03/29/1935, 80 y.o.   MRN: [id number]   [facility]  Code Status: DNR Goals of Care: Advanced Directive information Does patient have an advance directive?: Yes, Type of Advance Directive: Healthcare Power of [place];Out of facility DNR (pink MOST or yellow form), Pre-existing out of facility DNR order (yellow form or pink MOST form): Yellow form placed in chart (order not valid for inpatient use), Does patient want to make changes to advanced directive?: No - Patient declined   No Known Allergies  Chief Complaint  Patient presents with  . Medical Management of Chronic Issues    6 month follow-up, not fasting   . Immunizations    Prevnar    HPI: Patient is a 80 y.o. black female seen in the office today for med mgt of chronic diseases.    Has lost 1 lb.  Eating pretty good.  More with fingers--not knowing how to use utensils.  Would never want feeding tube and she would not keep it in.  Loses glasses.    No weird stools lately--not loose.    Sleeps curled up, then c/o leg pain in morning.  Occasionally gets leg pain  BP is good.    Last cscope was 2010 with polyps.  Is due for repeat.  Discussed cologuard as alternate, but pt would not be able to do prep for cscope if it were positive, nor go through the procedure of cleansing, nor recover from the sedation.  Decision was made not to further assess this due to no plans to do surgery or cancer treatments if she did have an abnormality on the colonoscopy anyway.  Had a fall in feb or mar at the [facility] and CT was negative (tried to sit on table instead of chair and missed).  Tries to sit in wrong places.    Cannot do mmse or 6 CIT b/c she is so advanced with her dementia.  History must be obtained from her daughter.    Mood good lately.  Talks about being ready if [facility] her.  Not tearful, laughing and happy today.    She is  active wandering around her home.  Is too advanced with dementia to learn use of assistive device.  Her daughter supports her ambulating and helps guide her to where she needs to go.  They are trying to avoid SNF placement.  [facility] goes to [facility] in daytime.  Review of Systems:  Review of Systems  Constitutional: Negative for malaise/fatigue.  HENT: Negative for congestion.   Eyes:       Keeps losing glasses  Respiratory: Negative for shortness of breath.   Cardiovascular: Negative for chest pain and leg swelling.  Gastrointestinal: Negative for abdominal pain, diarrhea, constipation, blood in stool and melena.  Musculoskeletal: Positive for falls.       "leg pain" when awakens in the morning  Neurological: Negative for dizziness.  Psychiatric/Behavioral: Positive for memory loss. Negative for depression and suicidal ideas. The patient does not have insomnia.      Past Medical History  Diagnosis Date  . Thyroid disease   . Hypertension   . IBS (irritable bowel syndrome)   . Alzheimer disease   . Lumbago   . Acute bronchitis   . Rickets, active   . Disorder of bone and cartilage, unspecified   . Malnutrition of mild degree   . Other and unspecified hyperlipidemia   .  Senile dementia, uncomplicated   . Dementia in conditions classified elsewhere with behavioral disturbance   . Dementia in conditions classified elsewhere with behavioral disturbance   . Anxiety state, unspecified   . Obsessive-compulsive disorders   . Alzheimer's disease   . Irritable bowel syndrome   . Dizziness and giddiness   . Personal history of urinary (tract) infection     Past Surgical History  Procedure Laterality Date  . Thyroid surgery    . Partial hysterectomy    . Replacement total knee      bilateral    Social History:   reports that she has never smoked. Her smokeless tobacco use includes Snuff. She reports that she does not drink alcohol or use illicit drugs.  Family History  Problem  Relation Age of Onset  . Aneurysm Mother     abdominal aortic  . Cancer Father     bone  . Cancer Sister     colon  . Diabetes Sister     Medications: Patient's Medications  New Prescriptions   No medications on file  Previous Medications   DIVALPROEX (DEPAKOTE SPRINKLE) 125 MG CAPSULE    TAKE ONE CAPSULE BY MOUTH TWICE A DAY FOR BEHAVIOR   LEVOTHYROXINE (SYNTHROID, LEVOTHROID) 100 MCG TABLET    TAKE 1 TABLET BY MOUTH FOR THYROID SUPPLEMENT   LISINOPRIL-HYDROCHLOROTHIAZIDE (PRINZIDE,ZESTORETIC) 20-25 MG PER TABLET    TAKE 1 TABLET BY MOUTH EVERY DAY   MULTIPLE VITAMIN (MULTIVITAMIN) LIQD    Take 5 mLs by mouth daily.   SERTRALINE (ZOLOFT) 100 MG TABLET    TAKE 2 TABLETS BY MOUTH EVERY DAY  Modified Medications   No medications on file  Discontinued Medications   CHOLECALCIFEROL (VITAMIN D) 2000 UNITS TABLET    Take 1 tablet (2,000 Units total) by mouth daily.     Physical Exam: Filed Vitals:   08/13/14 0850  BP: 108/66  Pulse: 93  Temp: 96.5 F (35.8 C)  TempSrc: Oral  Weight: 165 lb (74.844 kg)  SpO2: 92%  Physical Exam  Constitutional: She appears well-developed and well-nourished. No distress.  Cardiovascular: Normal rate, regular rhythm, normal heart sounds and intact distal pulses.   Pulmonary/Chest: Effort normal and breath sounds normal.  Abdominal: Soft. Bowel sounds are normal.  Musculoskeletal: Normal range of motion.  Walks arm in arm with her daughter  Neurological: She is alert.  Still verbal, but unable to put together logical sentences now, is very pleasant and happy though  Skin: Skin is warm and dry.  Psychiatric:  Abnormal moods related to dementia     Labs reviewed: Basic Metabolic Panel:  Recent Labs  16/01/9610/12/15 1036  NA 144  K 3.5  CL 102  CO2 24  GLUCOSE 68  BUN 25  CREATININE 1.12*  CALCIUM 9.3  TSH 1.080   Liver Function Tests: No results for input(s): AST, ALT, ALKPHOS, BILITOT, PROT, ALBUMIN in the last 8760 hours. No  results for input(s): LIPASE, AMYLASE in the last 8760 hours. No results for input(s): AMMONIA in the last 8760 hours. CBC:  Recent Labs  02/12/14 1036  WBC 7.4  NEUTROABS 5.1  HGB 13.2  HCT 38.1  MCV 88  PLT 264   Lipid Panel: No results for input(s): CHOL, HDL, LDLCALC, TRIG, CHOLHDL, LDLDIRECT in the last 8760 hours. No results found for: HGBA1C   Assessment/Plan 1. Alzheimer disease -advanced, is still ambulatory, but falling more, less verbal now, continues on depakote mood stabilizer -her daughter continues to look after her  -  Valproic Acid Level  2. Malnutrition of mild degree - cont liquid vitamin, eating finger foods, has only lost 1 lbs since 6 mos ago - Multiple Vitamin (MULTIVITAMIN) LIQD; Take 5 mLs by mouth daily.  Dispense: 870 mL; Refill: 0  3. History of colonic polyps -discussed cologuard option and cscope, but we decided that she would not tolerate the cscope if cologuard was positive so we were better off w/o knowing that result at this point -bowels have actually stabilized lately--no loose ones recently  4. Irritable bowel syndrome -doing better now that eating finger foods as cannot remember how to use utensils  5. Essential hypertension -bp at goal, no changes  6. Other specified hypothyroidism -cont synthroid and f/u tsh - TSH  7. Medication monitoring encounter - will reassess depakote level - Valproic Acid Level  8. Need for vaccination with 13-polyvalent pneumococcal conjugate vaccine -prevnar was given--she was calm enough to get this today, still needs pneumovax in 11 mos (for medicare to cover)  Labs/tests ordered:   Orders Placed This Encounter  Procedures  . Valproic Acid Level  . TSH    Next appt:  6 mos  Jeneen Doutt L. Melquan Ernsberger, D.O. Geriatrics [facility] [address], [place] [address] Cell Phone (Mon-Fri 8am-5pm):  [phone] On Call:  [phone] & follow prompts after 5pm &  weekends Office Phone:  [phone] Office Fax:  [phone]

## 2014-08-13 NOTE — Addendum Note (Signed)
Addended by: Maurice SmallBEATTY, Bryana Froemming C on: 08/13/2014 09:51 AM   Modules accepted: Orders

## 2014-08-14 LAB — TSH: TSH: 4.47 u[IU]/mL (ref 0.450–4.500)

## 2014-08-14 LAB — VALPROIC ACID LEVEL: Valproic Acid Lvl: 10 ug/mL — ABNORMAL LOW (ref 50–100)

## 2014-09-03 ENCOUNTER — Encounter: Payer: Self-pay | Admitting: Internal Medicine

## 2014-09-12 ENCOUNTER — Other Ambulatory Visit: Payer: Self-pay | Admitting: Internal Medicine

## 2015-01-04 ENCOUNTER — Ambulatory Visit (INDEPENDENT_AMBULATORY_CARE_PROVIDER_SITE_OTHER): Payer: Medicare Other | Admitting: Internal Medicine

## 2015-01-04 ENCOUNTER — Encounter: Payer: Self-pay | Admitting: Internal Medicine

## 2015-01-04 VITALS — BP 122/64 | HR 72 | Wt 160.0 lb

## 2015-01-04 DIAGNOSIS — R32 Unspecified urinary incontinence: Secondary | ICD-10-CM | POA: Diagnosis not present

## 2015-01-04 DIAGNOSIS — R2681 Unsteadiness on feet: Secondary | ICD-10-CM | POA: Diagnosis not present

## 2015-01-04 DIAGNOSIS — E038 Other specified hypothyroidism: Secondary | ICD-10-CM | POA: Diagnosis not present

## 2015-01-04 DIAGNOSIS — Z23 Encounter for immunization: Secondary | ICD-10-CM

## 2015-01-04 DIAGNOSIS — R159 Full incontinence of feces: Secondary | ICD-10-CM | POA: Insufficient documentation

## 2015-01-04 DIAGNOSIS — F32A Depression, unspecified: Secondary | ICD-10-CM | POA: Insufficient documentation

## 2015-01-04 DIAGNOSIS — F329 Major depressive disorder, single episode, unspecified: Secondary | ICD-10-CM

## 2015-01-04 DIAGNOSIS — I10 Essential (primary) hypertension: Secondary | ICD-10-CM | POA: Diagnosis not present

## 2015-01-04 DIAGNOSIS — H547 Unspecified visual loss: Secondary | ICD-10-CM

## 2015-01-04 DIAGNOSIS — F429 Obsessive-compulsive disorder, unspecified: Secondary | ICD-10-CM | POA: Insufficient documentation

## 2015-01-04 DIAGNOSIS — K580 Irritable bowel syndrome with diarrhea: Secondary | ICD-10-CM

## 2015-01-04 DIAGNOSIS — G309 Alzheimer's disease, unspecified: Secondary | ICD-10-CM

## 2015-01-04 DIAGNOSIS — F0280 Dementia in other diseases classified elsewhere without behavioral disturbance: Secondary | ICD-10-CM

## 2015-01-04 NOTE — Progress Notes (Signed)
Patient ID: Candice Woodward, female   DOB: 12/24/1934, 80 y.o.   MRN: [id number]   Location:  [facility]  Code Status: DNR Goals of Care: Advanced Directive information Does patient have an advance directive?: No, Would patient like information on creating an advanced directive?: Yes - Educational materials given   Chief Complaint  Patient presents with  . form    [facility]. Here with daughter Candice Woodward    HPI: Patient is a 80 y.o. black female seen in the office today for completion of the [facility] form.  Her daughter, Candice Woodward, is with her.  She also got her flu shot.    Walking is not stable and has decreased.  Stays in bed on weekends if not asked to get up.  Then has to be fed.   Eyesight also deteriorating.  Keeps her eyes closed a lot.   Last night was talking all night when in the bed.  Stopped at about 3-4am.  No recent falls.    Gets more agitated.  Cries when mentions her mother.  Was singing coming in here.    Blood pressure is good.    Medication going down ok in something else.    Review of Systems:  Review of Systems  Constitutional: Negative for fever and chills.  HENT: Negative for congestion and hearing loss.   Eyes: Positive for blurred vision.       Vision continuing to decline--had difficulty focusing on my face for eye exam  Respiratory: Negative for cough and shortness of breath.   Cardiovascular: Negative for chest pain and leg swelling.  Gastrointestinal: Positive for abdominal pain and diarrhea. Negative for constipation, blood in stool and melena.       Fecal incontinence  Genitourinary: Positive for urgency. Negative for dysuria.       Urinary incontinence  Musculoskeletal: Negative for falls.       Gait is unsteady more now than in the past, must hold onto someone's arm   Skin: Negative for rash.  Neurological: Negative for dizziness and loss of consciousness.  Psychiatric/Behavioral: Positive for  depression and memory loss.    Past Medical History  Diagnosis Date  . Thyroid disease   . Hypertension   . IBS (irritable bowel syndrome)   . Alzheimer disease   . Lumbago   . Acute bronchitis   . Rickets, active   . Disorder of bone and cartilage, unspecified   . Malnutrition of mild degree (HCC)   . Other and unspecified hyperlipidemia   . Senile dementia, uncomplicated   . Dementia in conditions classified elsewhere with behavioral disturbance   . Anxiety state, unspecified   . Obsessive-compulsive disorders   . Alzheimer's disease   . Irritable bowel syndrome   . Dizziness and giddiness   . Personal history of urinary (tract) infection     Past Surgical History  Procedure Laterality Date  . Thyroid surgery    . Partial hysterectomy    . Replacement total knee      bilateral    No Known Allergies Medications: Patient's Medications  New Prescriptions   No medications on file  Previous Medications   DIVALPROEX (DEPAKOTE SPRINKLE) 125 MG CAPSULE    TAKE ONE CAPSULE BY MOUTH TWICE A DAY FOR BEHAVIOR   LEVOTHYROXINE (SYNTHROID, LEVOTHROID) 100 MCG TABLET    TAKE 1 TABLET BY MOUTH FOR THYROID SUPPLEMENT   LISINOPRIL-HYDROCHLOROTHIAZIDE (PRINZIDE,ZESTORETIC) 20-25 MG PER TABLET    TAKE 1  TABLET BY MOUTH EVERY DAY   SERTRALINE (ZOLOFT) 100 MG TABLET    TAKE 2 TABLETS BY MOUTH EVERY DAY  Modified Medications   No medications on file  Discontinued Medications   MULTIPLE VITAMIN (MULTIVITAMIN) LIQD    Take 5 mLs by mouth daily.    Physical Exam: Filed Vitals:   01/04/15 0929  BP: 122/64  Pulse: 72  Weight: 160 lb (72.576 kg)  SpO2: 97%   Physical Exam  Constitutional: She appears well-developed and well-nourished. No distress.  Eyes: Pupils are equal, round, and reactive to light.  Unable to follow commands for more complete eye exam--grabbing at me, the light, my sleeves, etc.  Eyes are glassy grayish in appearance; unable to see where her daughter is in the room   Cardiovascular: Normal rate, regular rhythm, normal heart sounds and intact distal pulses.   Pulmonary/Chest: Effort normal and breath sounds normal.  Unable to follow commands  Abdominal: Soft. Bowel sounds are normal.  Has been incontinent of stool in depend  Musculoskeletal: Normal range of motion.  Unsteady shuffling gait  Neurological: She is alert.  Skin: Skin is warm and dry.  Psychiatric: She has a normal mood and affect.   Labs reviewed: Basic Metabolic Panel:  Recent Labs  16/10/96 1036 08/13/14 0933  NA 144  --   K 3.5  --   CL 102  --   CO2 24  --   GLUCOSE 68  --   BUN 25  --   CREATININE 1.12*  --   CALCIUM 9.3  --   TSH 1.080 4.470   Liver Function Tests: No results for input(s): AST, ALT, ALKPHOS, BILITOT, PROT, ALBUMIN in the last 8760 hours. No results for input(s): LIPASE, AMYLASE in the last 8760 hours. No results for input(s): AMMONIA in the last 8760 hours. CBC:  Recent Labs  02/12/14 1036  WBC 7.4  NEUTROABS 5.1  HGB 13.2  HCT 38.1  MCV 88  PLT 264    Assessment/Plan 1. Alzheimer disease -is continuing to progress -[employer] form completed today -she is requiring more and more help with ADLs including needing to be fed now at times -has agitation and combative behavior at times helped to some degree with depakote sprinkles--higher doses have caused oversedation -levels were stable in May--pt does not allow blood draws easily so avoiding frequent testing -depression and OCD symptoms unchanged with zoloft  daily--still has periods of tearfulness related to her mother   2. Irritable bowel syndrome with diarrhea -has been chronic -is incontinent now as a results of her dementia  3. Essential hypertension -bp is well controlled with [facility] -last labs wnl and not a good candidate for regular labs -eating well with help  4. Other specified hypothyroidism -continue current synthroid -last tsh was normal in May -pt is getting meds  but not 30 mins before meals b/c needs to take with food to get it down (otherwise refuses)  5. Urinary and fecal incontinence -due to her progressing dementia -cont depends and regular toileting  6. Gait instability -balance is worsening -will refer for PT at [facility] to help with this as best we can--not sure how much she will participate  7. Poor vision -vision is declining--pt has had memory loss for too long to successfully participate in an eye exam -she likely has cataracts and may have glaucoma, but also not a good candidate for surgery or drops due to agitation/behaviors from dementia  8. Need for prophylactic vaccination and inoculation against influenza -  Flu Vaccine QUAD 36+ mos PF IM (Fluarix & Fluzone Quad PF) shot was given  Labs/tests ordered:  No new--will need depakote level, tsh, cmp next visit  Next appt:  6 mos for med mgt and labs  Arlissa Monteverde L. Elgie Maziarz, D.O. Geriatrics [facility] [address], [place] [address] Cell Phone (Mon-Fri 8am-5pm):  [phone] On Call:  [phone] & follow prompts after 5pm & weekends Office Phone:  [phone] Office Fax:  [phone]

## 2015-01-23 ENCOUNTER — Other Ambulatory Visit: Payer: Self-pay | Admitting: Internal Medicine

## 2015-02-19 ENCOUNTER — Ambulatory Visit: Payer: Medicare Other | Admitting: Internal Medicine

## 2015-03-22 ENCOUNTER — Other Ambulatory Visit: Payer: Self-pay | Admitting: Internal Medicine

## 2015-05-02 ENCOUNTER — Other Ambulatory Visit: Payer: Self-pay | Admitting: Internal Medicine

## 2015-06-01 ENCOUNTER — Other Ambulatory Visit: Payer: Self-pay | Admitting: Internal Medicine

## 2015-06-01 NOTE — Telephone Encounter (Signed)
Refill for levothyroxine sent to pharmacy.

## 2015-06-04 ENCOUNTER — Telehealth: Payer: Self-pay | Admitting: *Deleted

## 2015-06-04 ENCOUNTER — Telehealth: Payer: Self-pay

## 2015-06-04 ENCOUNTER — Other Ambulatory Visit: Payer: Self-pay | Admitting: Internal Medicine

## 2015-06-04 DIAGNOSIS — G301 Alzheimer's disease with late onset: Principal | ICD-10-CM

## 2015-06-04 DIAGNOSIS — F02818 Alzheimer's disease with late onset: Secondary | ICD-10-CM

## 2015-06-04 DIAGNOSIS — R4182 Altered mental status, unspecified: Secondary | ICD-10-CM

## 2015-06-04 DIAGNOSIS — F0281 Dementia in other diseases classified elsewhere with behavioral disturbance: Secondary | ICD-10-CM

## 2015-06-04 NOTE — Telephone Encounter (Signed)
Notified daughter. LMOM that home health referral placed.

## 2015-06-04 NOTE — Telephone Encounter (Signed)
Will place home care referral.  Then RN can obtain the urine sample via I/O cath (will be a real challenge) and send us the results.  These changes could be progression of her dementia, also.

## 2015-06-04 NOTE — Telephone Encounter (Signed)
Patient daughter, [name] called and stated that patient has not walked in 3 weeks. Stated that there is nothing wrong with her legs because she kicks when she is trying to change her cloths. Daughter wonders if she has a UTI and wants to know how she can check this. Daughter cannot get her to cooperate with her. Also would like a referral for in home care. Daughter having a hard time doing it all herself. Please Advise.

## 2015-06-04 NOTE — Telephone Encounter (Signed)
Daughter [name] called back had message from office. Synetta Failnita had called to let her know that home health referral had been done. They will call her to set apt to come to the house.

## 2015-06-10 DIAGNOSIS — G301 Alzheimer's disease with late onset: Secondary | ICD-10-CM | POA: Diagnosis not present

## 2015-06-10 DIAGNOSIS — F0281 Dementia in other diseases classified elsewhere with behavioral disturbance: Secondary | ICD-10-CM | POA: Diagnosis not present

## 2015-06-10 DIAGNOSIS — I10 Essential (primary) hypertension: Secondary | ICD-10-CM | POA: Diagnosis not present

## 2015-06-10 DIAGNOSIS — R2689 Other abnormalities of gait and mobility: Secondary | ICD-10-CM | POA: Diagnosis not present

## 2015-06-16 ENCOUNTER — Telehealth: Payer: Self-pay

## 2015-06-16 NOTE — Telephone Encounter (Signed)
Message received via fax from [facility]- Please review heart rate noted from recent nursing visit. Please let us know if there is anything we can do to help care for your patient. Pulse on 06/10/15 was 112  Paperwork was give to Dr.Carter for review (Dr.Reed is out of office). Per Dr.Carter patient needs office visit.  I called [name] (patient's daughter) and scheduled appointment for tomorrow with NP at 10:30 am. [name] stated patient was agitated for she was awaken from her sleep and that may have contributed to her fast pulse.  [name] was upset that the home health nurse did not inform that the pulse was rapid, [name] will further discuss with [facility] at their next visit.

## 2015-06-17 ENCOUNTER — Ambulatory Visit: Payer: Self-pay | Admitting: Nurse Practitioner

## 2015-06-17 NOTE — Telephone Encounter (Signed)
Noted. Ok for DME Rx as written

## 2015-06-17 NOTE — Telephone Encounter (Signed)
Christina with [facility] called and stated that patient is unable to leave home due to bed bound. Stated patient cannot do OV visits. They did her home health admission last Thursday. Pulse was up due to waking her up. Vitals have been stable since then. Nurse stated that patient is bed bound and therapist is working with patient to try and get her up and moving. Nurse [facility]. Need Rx Faxed to her at [phone]. Please Advise

## 2015-06-18 MED ORDER — AMBULATORY NON FORMULARY MEDICATION
Status: DC
Start: 2015-06-18 — End: 2015-08-12

## 2015-06-18 NOTE — Telephone Encounter (Signed)
Printed DME Rx and faxed to [place] with [facility] Fax [phone]

## 2015-06-18 NOTE — Addendum Note (Signed)
Addended by: Nelda SevereMAY, ANITA A on: 06/18/2015 10:08 AM   Modules accepted: Orders

## 2015-06-21 ENCOUNTER — Emergency Department (HOSPITAL_COMMUNITY): Payer: Medicare Other

## 2015-06-21 ENCOUNTER — Emergency Department (HOSPITAL_COMMUNITY)
Admission: EM | Admit: 2015-06-21 | Discharge: 2015-06-21 | Disposition: A | Discharge status: Home / Self Care | Payer: Medicare Other | Attending: Emergency Medicine | Admitting: Emergency Medicine

## 2015-06-21 ENCOUNTER — Encounter (HOSPITAL_COMMUNITY): Payer: Self-pay | Admitting: Emergency Medicine

## 2015-06-21 DIAGNOSIS — Z87440 Personal history of urinary (tract) infections: Secondary | ICD-10-CM | POA: Diagnosis not present

## 2015-06-21 DIAGNOSIS — G309 Alzheimer's disease, unspecified: Secondary | ICD-10-CM | POA: Insufficient documentation

## 2015-06-21 DIAGNOSIS — E785 Hyperlipidemia, unspecified: Secondary | ICD-10-CM | POA: Diagnosis not present

## 2015-06-21 DIAGNOSIS — R55 Syncope and collapse: Secondary | ICD-10-CM | POA: Diagnosis not present

## 2015-06-21 DIAGNOSIS — E079 Disorder of thyroid, unspecified: Secondary | ICD-10-CM | POA: Insufficient documentation

## 2015-06-21 DIAGNOSIS — Z79899 Other long term (current) drug therapy: Secondary | ICD-10-CM | POA: Insufficient documentation

## 2015-06-21 DIAGNOSIS — I10 Essential (primary) hypertension: Secondary | ICD-10-CM | POA: Diagnosis not present

## 2015-06-21 DIAGNOSIS — N390 Urinary tract infection, site not specified: Secondary | ICD-10-CM | POA: Diagnosis not present

## 2015-06-21 DIAGNOSIS — F0280 Dementia in other diseases classified elsewhere without behavioral disturbance: Secondary | ICD-10-CM | POA: Diagnosis not present

## 2015-06-21 DIAGNOSIS — F419 Anxiety disorder, unspecified: Secondary | ICD-10-CM | POA: Diagnosis not present

## 2015-06-21 LAB — HEPATIC FUNCTION PANEL
ALK PHOS: 79 U/L (ref 38–126)
ALT: 23 U/L (ref 14–54)
AST: 28 U/L (ref 15–41)
Albumin: 2.9 g/dL — ABNORMAL LOW (ref 3.5–5.0)
BILIRUBIN TOTAL: 0.3 mg/dL (ref 0.3–1.2)
Bilirubin, Direct: <0.1 mg/dL — ABNORMAL LOW (ref 0.1–0.5)
Total Protein: 6.7 g/dL (ref 6.5–8.1)

## 2015-06-21 LAB — BASIC METABOLIC PANEL
Anion gap: 14 (ref 5–15)
BUN: 12 mg/dL (ref 6–20)
CHLORIDE: 103 mmol/L (ref 101–111)
CO2: 23 mmol/L (ref 22–32)
Calcium: 9.1 mg/dL (ref 8.9–10.3)
Creatinine, Ser: 1.17 mg/dL — ABNORMAL HIGH (ref 0.44–1.00)
GFR calc non Af Amer: 43 mL/min — ABNORMAL LOW (ref >60)
GFR, EST AFRICAN AMERICAN: 50 mL/min — AB (ref >60)
GLUCOSE: 185 mg/dL — AB (ref 65–99)
POTASSIUM: 3.6 mmol/L (ref 3.5–5.1)
Sodium: 140 mmol/L (ref 135–145)

## 2015-06-21 LAB — CBC
HEMATOCRIT: 38.2 % (ref 36.0–46.0)
Hemoglobin: 12.4 g/dL (ref 12.0–15.0)
MCH: 28.4 pg (ref 26.0–34.0)
MCHC: 32.5 g/dL (ref 30.0–36.0)
MCV: 87.6 fL (ref 78.0–100.0)
Platelets: 304 10*3/uL (ref 150–400)
RBC: 4.36 MIL/uL (ref 3.87–5.11)
RDW: 13.8 % (ref 11.5–15.5)
WBC: 10.3 10*3/uL (ref 4.0–10.5)

## 2015-06-21 LAB — I-STAT TROPONIN, ED: TROPONIN I, POC: 0 ng/mL (ref 0.00–0.08)

## 2015-06-21 LAB — URINE MICROSCOPIC-ADD ON

## 2015-06-21 LAB — URINALYSIS, ROUTINE W REFLEX MICROSCOPIC
BILIRUBIN URINE: NEGATIVE
GLUCOSE, UA: NEGATIVE mg/dL
Hgb urine dipstick: NEGATIVE
KETONES UR: NEGATIVE mg/dL
Nitrite: POSITIVE — AB
PH: 6 (ref 5.0–8.0)
Protein, ur: NEGATIVE mg/dL
Specific Gravity, Urine: 1.012 (ref 1.005–1.030)

## 2015-06-21 LAB — CBG MONITORING, ED: Glucose-Capillary: 173 mg/dL — ABNORMAL HIGH (ref 65–99)

## 2015-06-21 MED ORDER — CEPHALEXIN 500 MG PO CAPS: 500.0000 mg | ORAL_CAPSULE | Freq: Two times a day (BID) | ORAL | Status: DC

## 2015-06-21 MED ORDER — DEXTROSE 5 % IV SOLN
1.0000 g | Freq: Once | INTRAVENOUS | Status: AC
  Administered 2015-06-21: 1 g via INTRAVENOUS
  Filled 2015-06-21: qty 10

## 2015-06-21 MED ORDER — CEPHALEXIN 125 MG/5ML PO SUSR: 500.0000 mg | Freq: Two times a day (BID) | ORAL | Status: AC

## 2015-06-21 MED ORDER — SODIUM CHLORIDE 0.9 % IV BOLUS (SEPSIS)
500.0000 mL | Freq: Once | INTRAVENOUS | Status: AC
  Administered 2015-06-21: 500 mL via INTRAVENOUS

## 2015-06-21 NOTE — ED Notes (Signed)
Provider at bedside

## 2015-06-21 NOTE — ED Notes (Signed)
Onset today while at home home health aid and family member ambulating patient witnessed syncope event assisted patient to floor. Syncope for 1 minute reported to EMS.  EMS stated upon arrival alert to normal history of dementia.

## 2015-06-21 NOTE — ED Notes (Signed)
Daughter at bedside who assisted Nurse and Nurse Tech changing patient after a bowel movement soft formed.  Daughter stated new skin breakdown right and left buttocks. Skin red pink. Daughter also stated new formation bilateral heels dark in color in skin break down noticed a couple of days ago.

## 2015-06-21 NOTE — ED Notes (Signed)
Doctor at bedside.

## 2015-06-21 NOTE — ED Notes (Signed)
Checked blood sugar it was 173 notified RN Greg of blood sugar

## 2015-06-21 NOTE — ED Provider Notes (Addendum)
CSN: [id number]     Arrival date & time 06/21/15  1138 History   First MD Initiated Contact with Patient 06/21/15 1156     Chief Complaint  Patient presents with  . Loss of Consciousness   LEVEL 5 CAVEAT - DEMENTIA  (Consider location/radiation/quality/duration/timing/severity/associated sxs/prior Treatment) HPI  81 year old female presents with syncope. History is taken from the daughter at the bedside. Patient has a history of significant dementia. For the past 1 month she has been bedbound over the last 1 week has been trying to walk in the house with physical therapy. Daughter states that while the patient was walking with physical therapy she all of a sudden collapsed. She did not fall and hit her head. She was out for about 1 minute. Patient states that today the patient has not been acting right, mostly since the syncope. She seems more confused than typical and is doing some other things in her face that are not typical for her. Difficult for the daughter to localize but mostly it seems to be lipsmacking.  Past Medical History  Diagnosis Date  . Thyroid disease   . Hypertension   . IBS (irritable bowel syndrome)   . Alzheimer disease   . Lumbago   . Acute bronchitis   . Rickets, active   . Disorder of bone and cartilage, unspecified   . Malnutrition of mild degree (HCC)   . Other and unspecified hyperlipidemia   . Senile dementia, uncomplicated   . Dementia in conditions classified elsewhere with behavioral disturbance   . Anxiety state, unspecified   . Obsessive-compulsive disorders   . Alzheimer's disease   . Irritable bowel syndrome   . Dizziness and giddiness   . Personal history of urinary (tract) infection    Past Surgical History  Procedure Laterality Date  . Thyroid surgery    . Partial hysterectomy    . Replacement total knee      bilateral   Family History  Problem Relation Age of Onset  . Aneurysm Mother     abdominal aortic  . Cancer Father     bone   . Cancer Sister     colon  . Diabetes Sister    Social History  Substance Use Topics  . Smoking status: Never Smoker   . Smokeless tobacco: Current User    Types: Snuff  . Alcohol Use: No   OB History    No data available     Review of Systems  Unable to perform ROS: Dementia      Allergies  Review of patient's allergies indicates no known allergies.  Home Medications   Prior to Admission medications   Medication Sig Start Date End Date Taking? Authorizing Provider  AMBULATORY NON FORMULARY MEDICATION Semi [facility] Bed Wheelchair with feet rest Bedside Commode Dx: G30.9, R26.81 06/18/15   Kirt Boys, DO  divalproex (DEPAKOTE SPRINKLE) 125 MG capsule TAKE ONE CAPSULE BY MOUTH TWICE A DAY FOR BEHAVIOR 05/03/15   Tiffany L Reed, DO  levothyroxine (SYNTHROID, LEVOTHROID) 100 MCG tablet TAKE 1 TABLET BY MOUTH FOR THYROID SUPPLEMENT 06/01/15   Tiffany L Reed, DO  lisinopril-hydrochlorothiazide (PRINZIDE,ZESTORETIC) 20-25 MG tablet TAKE 1 TABLET BY MOUTH EVERY DAY 03/22/15   Tiffany L Reed, DO  sertraline (ZOLOFT) 100 MG tablet TAKE 2 TABLETS BY MOUTH EVERY DAY Patient taking differently: take one tablet twice daily 06/19/14   Tiffany L Reed, DO   BP 107/69 mmHg  Pulse 82  Temp(Src) 98.6 F (37 C) (Oral)  Resp  16  Ht  (1.676 m)  Wt 160 lb (72.576 kg)  BMI 25.84 kg/m2  SpO2 99% Physical Exam  Constitutional: She appears well-developed and well-nourished.  HENT:  Head: Normocephalic and atraumatic.  Right Ear: External ear normal.  Left Ear: External ear normal.  Nose: Nose normal.  Eyes: Right eye exhibits no discharge. Left eye exhibits no discharge.  Cardiovascular: Normal rate, regular rhythm and normal heart sounds.   Pulmonary/Chest: Effort normal and breath sounds normal.  Abdominal: Soft. There is no tenderness.  Neurological: She is disoriented.  Awake, does not answer orientation questions. Moves all 4 extremities but does not follow  commands.  Skin: Skin is warm and dry.  Stage 1 pressure sores on bilateral heels  Nursing note and vitals reviewed.   ED Course  Procedures (including critical care time) Labs Review Labs Reviewed  BASIC METABOLIC PANEL - Abnormal; Notable for the following:    Glucose, Bld 185 (*)    Creatinine, Ser 1.17 (*)    GFR calc non Af Amer 43 (*)    GFR calc Af Amer 50 (*)    All other components within normal limits  URINALYSIS, ROUTINE W REFLEX MICROSCOPIC (NOT AT [facility]) - Abnormal; Notable for the following:    APPearance CLOUDY (*)    Nitrite POSITIVE (*)    Leukocytes, UA TRACE (*)    All other components within normal limits  HEPATIC FUNCTION PANEL - Abnormal; Notable for the following:    Albumin 2.9 (*)    Bilirubin, Direct <0.1 (*)    All other components within normal limits  URINE MICROSCOPIC-ADD ON - Abnormal; Notable for the following:    Squamous Epithelial / LPF 0-5 (*)    Bacteria, UA MANY (*)    All other components within normal limits  CBG MONITORING, ED - Abnormal; Notable for the following:    Glucose-Capillary 173 (*)    All other components within normal limits  URINE CULTURE  CBC  I-STAT TROPOININ, ED    Imaging Review Ct Head Wo Contrast  06/21/2015  CLINICAL DATA:  Altered mental status.  History of dementia. EXAM: CT HEAD WITHOUT CONTRAST TECHNIQUE: Contiguous axial images were obtained from the base of the skull through the vertex without intravenous contrast. COMPARISON:  Head CT 06/11/2014 and 02/12/2012. FINDINGS: Brain: There is no evidence of acute intracranial hemorrhage, mass lesion, brain edema or extra-axial fluid collection. The ventricles and subarachnoid spaces are diffusely prominent, especially in the temporal lobes, but stable. There is no CT evidence of acute cortical infarction. There is chronic confluent low-density within the periventricular and subcortical white matter bilaterally. Diffuse intracranial vascular calcifications are  present. Bones/sinuses/visualized face: The visualized paranasal sinuses, mastoid air cells and middle ears are clear. The calvarium is intact. IMPRESSION: No acute intracranial findings demonstrated. Stable chronic atrophy with a temporal lobe predominance and chronic periventricular and subcortical white matter disease. Electronically Signed   By: Carey Bullocks M.D.   On: 06/21/2015 13:49   Dg Chest Port 1 View  06/21/2015  CLINICAL DATA:  Weakness today. Altered mental status. Initial encounter. EXAM: PORTABLE CHEST 1 VIEW COMPARISON:  PA and lateral chest 06/27/2011 and 08/04/2006. FINDINGS: Lung volumes somewhat low with mild left basilar atelectasis. Heart size is upper normal. No pneumothorax or pleural effusion. No focal bony abnormality. IMPRESSION: No acute disease. Electronically Signed   By: Drusilla Kanner M.D.   On: 06/21/2015 12:54   I have personally reviewed and evaluated these images and lab results  as part of my medical decision-making.   EKG Interpretation   Date/Time:  Monday June 21 2015 11:42:17 EDT Ventricular Rate:  88 PR Interval:  148 QRS Duration: 108 QT Interval:  385 QTC Calculation: 466 R Axis:   -16 Text Interpretation:  Sinus rhythm Incomplete left bundle branch block  Artifact in lead(s) I II III aVR aVL aVF V6 no significant change since  2008 Confirmed by Bradie Sangiovanni  MD, Fernando Torry ([id number]) on 06/21/2015 11:51:50 AM      MDM   Final diagnoses:  Syncope, unspecified syncope type  UTI (lower urinary tract infection)    Is unclear why the patient passed out today. Could be related to deconditioning as the daughter states that she has just now started walking and after being bedbound for 1 month. She is apparent follow closely with the PCP with physical therapy and other resources. Patient does have a UTI which could account for some change in mental status. I discussed observation admission versus going home with daughter and she prefers to go home. She  understands her could have been an arrhythmia but at this point she would not want invasive treatment or management for the patient given her severe dementia. She would like antibiotics. She will follow-up with PCP as far as possible nursing care at home. She adamantly does not want to take the patient to a nursing home or want social worker consultation here. Discussed strict return precautions.    Pricilla LovelessScott Boaz Berisha, MD 06/21/15 1644  Pricilla LovelessScott Blythe Veach, MD 06/21/15 [phone]

## 2015-06-21 NOTE — ED Notes (Signed)
Spoke with daughter regarding case Production designer, theatre/television/filmmanager and gave information regarding [facility] equipment.  [facility] will call daughter regarding delivery of equipment for tomorrow.

## 2015-06-21 NOTE — Care Management (Signed)
ED CM spoke with Tammy SoursGreg RN on Pod E patient's is concerned about Medical Equipment that was ordered, CM unable to see referral for equipment, [facility], [facility] could not find the referral CM [facility] for Hospital bed, w/c, 3 n 1 chair, with hoyer lift to [phone].

## 2015-06-21 NOTE — ED Notes (Signed)
Doctor notified patient's daughter wanted to speak with Doctor.

## 2015-06-21 NOTE — ED Notes (Addendum)
Spoke to daughter regarding discharge.  Daughter would now like to speak to case manager regarding equipment supposed to be delivered to patient's house.  Case manger notified.

## 2015-06-22 ENCOUNTER — Telehealth: Payer: Self-pay | Admitting: *Deleted

## 2015-06-22 NOTE — Telephone Encounter (Signed)
Notified Daughter that Dr. Renato Gailseed would be calling her. Agreed.

## 2015-06-22 NOTE — Telephone Encounter (Signed)
Patient does not have an appointment until 07/05/2015 with you.

## 2015-06-22 NOTE — Telephone Encounter (Signed)
No. Not this week.

## 2015-06-22 NOTE — Telephone Encounter (Signed)
Noted.  I have also responded.

## 2015-06-22 NOTE — Telephone Encounter (Signed)
I see that note I read was from before she went to the hospital.  Do we not have an NP coming in for acutes this week?

## 2015-06-22 NOTE — Telephone Encounter (Signed)
I understand that pt has been scheduled to see NP to address this.  As I wrote in that note, my recommendation would be hospice care to help her, but that will need to be discussed by a provider with Angelique Blonderenise, pt's daughter.

## 2015-06-22 NOTE — Telephone Encounter (Signed)
Patient daughter called and stated that patient went to the ER yesterday due to syncope.Marland Kitchen. Unresponsive and stopped breathing. EKG and CT Scan done and came back normal, hospital discharged patient yesterday. Daughter thought she Merwin Breden have had UTI and she did and the hospital gave her an antibiotic. Her mother has wounds on her heel of her feet that is really bad and bedsore on the inside part of butt cheek. Daughter is not happy with [facility], they come out and do PT. Daughter stated she finally admits to needing help and they are not much help. Daughter wants to know what she can do for her wound to take care of it until her appointment with you on 07/05/15. Please Advise.

## 2015-06-22 NOTE — Telephone Encounter (Signed)
The home health should be able to send an RN to assist with the wound care until I see her.  I will try to call [name] in the next couple of days to speak with her.

## 2015-06-23 NOTE — Telephone Encounter (Signed)
Marcelino DusterMichelle with [facility] called and stated that patient has deep dark red areas on both heels. Wrapped both heels with foam dressing and gauze. Not draining.  Not open yet. Patient has been on the couch and unable to get up. Can't walk anymore. Started on antibiotic on Monday due to UTI. Stated that her behaviors are all over the place.

## 2015-06-23 NOTE — Telephone Encounter (Signed)
Noted.  If possible, I would prefer she be seen next week [facility] rather than waiting until April.

## 2015-06-23 NOTE — Telephone Encounter (Signed)
[facility].

## 2015-06-25 LAB — URINE CULTURE

## 2015-06-26 ENCOUNTER — Telehealth: Payer: Self-pay | Admitting: *Deleted

## 2015-06-26 NOTE — ED Notes (Signed)
(+)  urine culture, treated with Cephalexin, reviewed by Mayme GentaBen Cartner, no changes needed

## 2015-07-01 ENCOUNTER — Telehealth: Payer: Self-pay

## 2015-07-01 ENCOUNTER — Ambulatory Visit (INDEPENDENT_AMBULATORY_CARE_PROVIDER_SITE_OTHER): Payer: Medicare Other | Admitting: Nurse Practitioner

## 2015-07-01 ENCOUNTER — Encounter: Payer: Self-pay | Admitting: Nurse Practitioner

## 2015-07-01 VITALS — BP 110/64 | HR 83 | Temp 98.5°F

## 2015-07-01 DIAGNOSIS — F329 Major depressive disorder, single episode, unspecified: Secondary | ICD-10-CM | POA: Diagnosis not present

## 2015-07-01 DIAGNOSIS — G309 Alzheimer's disease, unspecified: Secondary | ICD-10-CM | POA: Diagnosis not present

## 2015-07-01 DIAGNOSIS — F0280 Dementia in other diseases classified elsewhere without behavioral disturbance: Secondary | ICD-10-CM

## 2015-07-01 DIAGNOSIS — I10 Essential (primary) hypertension: Secondary | ICD-10-CM | POA: Diagnosis not present

## 2015-07-01 DIAGNOSIS — R2681 Unsteadiness on feet: Secondary | ICD-10-CM

## 2015-07-01 DIAGNOSIS — L89620 Pressure ulcer of left heel, unstageable: Secondary | ICD-10-CM

## 2015-07-01 DIAGNOSIS — L89610 Pressure ulcer of right heel, unstageable: Secondary | ICD-10-CM | POA: Diagnosis not present

## 2015-07-01 DIAGNOSIS — F32A Depression, unspecified: Secondary | ICD-10-CM

## 2015-07-01 DIAGNOSIS — E039 Hypothyroidism, unspecified: Secondary | ICD-10-CM

## 2015-07-01 NOTE — Assessment & Plan Note (Addendum)
Continue Depakote 125mg  bid, update CMP

## 2015-07-01 NOTE — Assessment & Plan Note (Signed)
Worse 

## 2015-07-01 NOTE — Addendum Note (Signed)
Addended by: Chriss DriverLANE, TERRY L on: 07/01/2015 01:49 PM   Modules accepted: Orders

## 2015-07-01 NOTE — Assessment & Plan Note (Signed)
Controlled, continue Lisinopril HCT 20/25mg  daily

## 2015-07-01 NOTE — Progress Notes (Signed)
Patient ID: Candice ArtisLois B Whang, female   DOB: 09/24/1934, 81 y.o.   MRN: [id number]   Location:   [facility]   [facility]   Provider: Chipper [facility] Katriana Dortch NP  Code Status: DNR Goals of Care:  Advanced Directives 07/01/2015  Does patient have an advance directive? No  Type of Advance Directive -  Does patient want to make changes to advanced directive? -  Copy of advanced directive(s) in chart? -  Would patient like information on creating an advanced directive? -  Pre-existing out of facility DNR order (yellow form or pink MOST form) -     Chief Complaint  Patient presents with  . Foot Problem    heel wound on both feet, left foot has a deeper wound , patients daughter states that she dose not no how long they have been there.     HPI: Patient is a 81 y.o. female seen today for an acute visit for lethargy, Hx of Alzheimer's dementia, takes Depakote for behaviors. Blood pressure is controlled on Lisinopril/Htc, mood is controlled on Sertraline 200mg  daily, noted R+L heel pressure injury, fluid filled wounds.   Past Medical History  Diagnosis Date  . Thyroid disease   . Hypertension   . IBS (irritable bowel syndrome)   . Alzheimer disease   . Lumbago   . Acute bronchitis   . Rickets, active   . Disorder of bone and cartilage, unspecified   . Malnutrition of mild degree (HCC)   . Other and unspecified hyperlipidemia   . Senile dementia, uncomplicated   . Dementia in conditions classified elsewhere with behavioral disturbance   . Anxiety state, unspecified   . Obsessive-compulsive disorders   . Alzheimer's disease   . Irritable bowel syndrome   . Dizziness and giddiness   . Personal history of urinary (tract) infection     Past Surgical History  Procedure Laterality Date  . Thyroid surgery    . Partial hysterectomy    . Replacement total knee      bilateral    No Known Allergies    Medication List       This list is accurate as of: 07/01/15  1:07 PM.  Always  use your most recent med list.               AMBULATORY NON FORMULARY MEDICATION  [employer] [facility] Bed Wheelchair with feet rest Bedside Commode Dx: G30.9, R26.81     divalproex 125 MG capsule  Commonly known as:  DEPAKOTE SPRINKLE  TAKE ONE CAPSULE BY MOUTH TWICE A DAY FOR BEHAVIOR     levothyroxine 100 MCG tablet  Commonly known as:  SYNTHROID, LEVOTHROID  TAKE 1 TABLET BY MOUTH FOR THYROID SUPPLEMENT     lisinopril-hydrochlorothiazide 20-25 MG tablet  Commonly known as:  PRINZIDE,ZESTORETIC  TAKE 1 TABLET BY MOUTH EVERY DAY     sertraline 100 MG tablet  Commonly known as:  ZOLOFT  TAKE 2 TABLETS BY MOUTH EVERY DAY        Review of Systems:  Review of Systems  Constitutional: Negative for fever and chills.  HENT: Negative for congestion and hearing loss.   Eyes:       Vision continuing to decline--had difficulty focusing on my face for eye exam  Respiratory: Negative for cough and shortness of breath.   Cardiovascular: Negative for chest pain and leg swelling.  Gastrointestinal: Positive for abdominal pain and diarrhea. Negative for constipation and blood in stool.  Fecal incontinence  Genitourinary: Positive for urgency. Negative for dysuria.       Urinary incontinence  Musculoskeletal:       Gait is unsteady more now than in the past, must hold onto someone's arm   Skin: Negative for rash.  Neurological: Negative for dizziness.    Health Maintenance  Topic Date Due  . ZOSTAVAX  03/07/1997  . PNA vac Low Risk Adult (2 of 2 - PPSV23) 08/13/2015  . INFLUENZA VACCINE  11/02/2015  . TETANUS/TDAP  02/11/2022  . DEXA SCAN  Completed    Physical Exam: Filed Vitals:   07/01/15 1214  BP: 110/64  Pulse: 83  Temp: 98.5 F (36.9 C)  TempSrc: Oral  SpO2: 95%   There is no weight on file to calculate BMI. Physical Exam  Constitutional: She appears well-developed and well-nourished. No distress.  Eyes: Pupils are equal, round, and reactive to light.    Unable to follow commands for more complete eye exam--grabbing at me, the light, my sleeves, etc.  Eyes are glassy grayish in appearance; unable to see where her daughter is in the room  Cardiovascular: Normal rate, regular rhythm, normal heart sounds and intact distal pulses.   Pulmonary/Chest: Effort normal and breath sounds normal.  Unable to follow commands  Abdominal: Soft. Bowel sounds are normal.  Has been incontinent of stool in depend  Musculoskeletal: Normal range of motion.  Unsteady shuffling gait  Neurological: She is alert.  Skin: Skin is warm and dry.  Pressure injury R+L heel fluid filled wounds  Psychiatric: She has a normal mood and affect.    Labs reviewed: Basic Metabolic Panel:  Recent Labs  16/10/96 0933 06/21/15 1218  NA  --  140  K  --  3.6  CL  --  103  CO2  --  23  GLUCOSE  --  185*  BUN  --  12  CREATININE  --  1.17*  CALCIUM  --  9.1  TSH 4.470  --    Liver Function Tests:  Recent Labs  06/21/15 1312  AST 28  ALT 23  ALKPHOS 79  BILITOT 0.3  PROT 6.7  ALBUMIN 2.9*   No results for input(s): LIPASE, AMYLASE in the last 8760 hours. No results for input(s): AMMONIA in the last 8760 hours. CBC:  Recent Labs  06/21/15 1218  WBC 10.3  HGB 12.4  HCT 38.2  MCV 87.6  PLT 304   Lipid Panel: No results for input(s): CHOL, HDL, LDLCALC, TRIG, CHOLHDL, LDLDIRECT in the last 8760 hours. No results found for: HGBA1C  Procedures since last visit: Ct Head Wo Contrast  06/21/2015  CLINICAL DATA:  Altered mental status.  History of dementia. EXAM: CT HEAD WITHOUT CONTRAST TECHNIQUE: Contiguous axial images were obtained from the base of the skull through the vertex without intravenous contrast. COMPARISON:  Head CT 06/11/2014 and 02/12/2012. FINDINGS: Brain: There is no evidence of acute intracranial hemorrhage, mass lesion, brain edema or extra-axial fluid collection. The ventricles and subarachnoid spaces are diffusely prominent, especially  in the temporal lobes, but stable. There is no CT evidence of acute cortical infarction. There is chronic confluent low-density within the periventricular and subcortical white matter bilaterally. Diffuse intracranial vascular calcifications are present. Bones/sinuses/visualized face: The visualized paranasal sinuses, mastoid air cells and middle ears are clear. The calvarium is intact. IMPRESSION: No acute intracranial findings demonstrated. Stable chronic atrophy with a temporal lobe predominance and chronic periventricular and subcortical white matter disease. Electronically Signed   By:  Carey Bullocks M.D.   On: 06/21/2015 13:49   Dg Chest Port 1 View  06/21/2015  CLINICAL DATA:  Weakness today. Altered mental status. Initial encounter. EXAM: PORTABLE CHEST 1 VIEW COMPARISON:  PA and lateral chest 06/27/2011 and 08/04/2006. FINDINGS: Lung volumes somewhat low with mild left basilar atelectasis. Heart size is upper normal. No pneumothorax or pleural effusion. No focal bony abnormality. IMPRESSION: No acute disease. Electronically Signed   By: Drusilla Kanner M.D.   On: 06/21/2015 12:54    Assessment/Plan Hypothyroidism Takes Levothyroxine daily, last TSH not available. Update TSH  Alzheimer disease Continue Depakote  bid, update CMP  Essential hypertension Controlled, continue Lisinopril HCT 20/25mg  daily  Depression Mood is managed with Sertraline  daily.   Gait instability Worse.   Pressure ulcer of both heels, unstageable (HCC) R+L pressure injury, fluid filled, continue pressure reduction, protective dressing for now.      Labs/tests ordered:  @ CMP, TSH  Next appt:  07/05/2015

## 2015-07-01 NOTE — Assessment & Plan Note (Signed)
Takes Levothyroxine daily, last TSH not available. Update TSH

## 2015-07-01 NOTE — Assessment & Plan Note (Signed)
R+L pressure injury, fluid filled, continue pressure reduction, protective dressing for now.

## 2015-07-01 NOTE — Telephone Encounter (Signed)
Patients daughter called to ask if she should keep her mother's appointment for today with the NP because of her mother's pressure wound on her foot and just be seen for this on Monday. I advised her there is a possibility wound could get worse and she really should keep her appointment today to have wound looked at.

## 2015-07-01 NOTE — Assessment & Plan Note (Signed)
Mood is managed with Sertraline 200mg  daily.

## 2015-07-03 ENCOUNTER — Other Ambulatory Visit: Payer: Self-pay | Admitting: Internal Medicine

## 2015-07-05 ENCOUNTER — Ambulatory Visit: Payer: Medicare Other | Admitting: Internal Medicine

## 2015-07-05 ENCOUNTER — Telehealth: Payer: Self-pay | Admitting: *Deleted

## 2015-07-05 NOTE — Telephone Encounter (Signed)
Daughter, [name] called and stated that patient had an appointment for today but daughter is sick and can't bring her. Daughter wants to know how she can take precautions with her mom not getting it and what she needs to do regarding her heel wounds. Home Health, [facility],  is coming out but daughter also wants to be prepared and know how to take care of. Bandaid, bandage over it and then sheep skin protectors over that with Heel pillows and home health also treating. i told her that this was good and that Home health would call with any concerns. Told her to keep her hands washed and sanitized and not to get in patient's face. And to disinfect. Agreed.

## 2015-07-05 NOTE — Telephone Encounter (Signed)
Noted, I agree with your advice.  Thanks, Synetta FailAnita.

## 2015-07-06 ENCOUNTER — Telehealth: Payer: Self-pay

## 2015-07-06 NOTE — Telephone Encounter (Signed)
Christine from hospice called to ask about a order for hospice she said she left a message last week and has not heard needs order. Pleas Advise

## 2015-07-06 NOTE — Telephone Encounter (Signed)
Spoke with Dr. Renato Gailseed, she approved Hospice Referral to come into the home.Marland Kitchen.Marland Kitchen.Imformed Christina with Hospice..Apolinar Junescdavis

## 2015-07-06 NOTE — Telephone Encounter (Signed)
Please indicate which hospice and the phone number in her chart so we know who to reach as needed.

## 2015-07-07 ENCOUNTER — Telehealth: Payer: Self-pay | Admitting: *Deleted

## 2015-07-07 NOTE — Telephone Encounter (Signed)
Daughter, [name] called and stated that she wants to speak with you personally. Wants you to call her # [phone]. She feels you are the only one who truly knows her mother. She does not trust [facility] and will not allow them to speak on her behalf. She feels they cannot handle her mother and they don't have the patience nor attitude to. She does not trust them.  Last week Social worker comes with [facility]. Daughter understands this process, but was mortified because they used the word death in front of her mother instead of expired. Was very insensitive.  Daughter really wants to speak with you, please Call.

## 2015-07-22 ENCOUNTER — Telehealth: Payer: Self-pay | Admitting: *Deleted

## 2015-07-22 NOTE — Telephone Encounter (Signed)
Candice Woodward with [facility] notified and agreed.

## 2015-07-22 NOTE — Telephone Encounter (Signed)
Laurette SchimkeJoshua Borders, NP [facility] called and requested an order to be faxed to [facility] for Air Pressure Pad and Pump (air mattress). Due to Pressure sores. Please Advise.

## 2015-07-22 NOTE — Telephone Encounter (Signed)
Candice Woodward with [facility] called and stated that patient's Heel wounds have opened. Right heel is draining small to moderate amount of thick drainage with NO odor. The Left heel is not draining. Nurse wants to know if they should apply Allanganate AG and change twice weekly. Please Advise.

## 2015-07-22 NOTE — Telephone Encounter (Signed)
New wound care orders are fine.

## 2015-07-22 NOTE — Telephone Encounter (Signed)
Please order air mattress due to pressure ulcers.

## 2015-07-26 MED ORDER — AMBULATORY NON FORMULARY MEDICATION: Status: DC

## 2015-07-26 NOTE — Telephone Encounter (Signed)
Rx printed and faxed to [facility] 

## 2015-08-02 ENCOUNTER — Ambulatory Visit: Payer: Medicare Other | Admitting: Internal Medicine

## 2015-08-09 DIAGNOSIS — I10 Essential (primary) hypertension: Secondary | ICD-10-CM | POA: Diagnosis not present

## 2015-08-09 DIAGNOSIS — L89612 Pressure ulcer of right heel, stage 2: Secondary | ICD-10-CM | POA: Diagnosis not present

## 2015-08-09 DIAGNOSIS — M6281 Muscle weakness (generalized): Secondary | ICD-10-CM | POA: Diagnosis not present

## 2015-08-09 DIAGNOSIS — L89622 Pressure ulcer of left heel, stage 2: Secondary | ICD-10-CM | POA: Diagnosis not present

## 2015-08-12 ENCOUNTER — Other Ambulatory Visit: Payer: Self-pay | Admitting: Internal Medicine

## 2015-08-12 ENCOUNTER — Telehealth: Payer: Self-pay

## 2015-08-12 MED ORDER — COLLAGENASE 250 UNIT/GM EX OINT: 1.0000 "application " | TOPICAL_OINTMENT | Freq: Every day | CUTANEOUS | Status: DC

## 2015-08-12 NOTE — Telephone Encounter (Signed)
rx sent to pharmacy by e-script  

## 2015-08-12 NOTE — Telephone Encounter (Signed)
Ok to send santyl to pharmacy for left heel wound.  Apply to affected tissue daily.

## 2015-08-12 NOTE — Telephone Encounter (Signed)
Candice Woodward with [facility] [phone] called, patient's left heel still has black soft tissue, they would like to use Stantyl on the heel. Will need to be called [facility] [place]. Message to Dr. Renato Gailseed

## 2015-08-17 ENCOUNTER — Other Ambulatory Visit: Payer: Self-pay | Admitting: *Deleted

## 2015-08-17 MED ORDER — COLLAGENASE 250 UNIT/GM EX OINT: 1.0000 "application " | TOPICAL_OINTMENT | Freq: Every day | CUTANEOUS | Status: DC

## 2015-08-17 NOTE — Telephone Encounter (Signed)
Candice Woodward with [facility] called and stated that the Rx for Santyl was not sent to pharmacy. Called Pharmacy and they stated that they have had it since last week. Nurse notified.

## 2015-08-28 ENCOUNTER — Other Ambulatory Visit: Payer: Self-pay | Admitting: Internal Medicine

## 2015-09-07 ENCOUNTER — Telehealth: Payer: Self-pay

## 2015-09-07 NOTE — Telephone Encounter (Signed)
[name] called to inform Dr.Reed that her mother has swelling near left ear since last night (cause unknown). Patient is unable to articulate pain. [facility]'s next [facility] visit is tomorrow. Patient unable to come into office due to transportation issues. Patient is currently pending an interview (next week) to qualify for SCAT services. [name] is requesting [facility] [place]. I informed [name] that patient may need an appointment for prescribed medications. [name] requested this message to be reviewed by Dr.Reed.  Please advise

## 2015-09-08 ENCOUNTER — Telehealth: Payer: Self-pay | Admitting: *Deleted

## 2015-09-08 ENCOUNTER — Ambulatory Visit: Payer: Self-pay | Admitting: Internal Medicine

## 2015-09-08 NOTE — Telephone Encounter (Signed)
[name] aware of Dr.Reed's response, [name] states [facility] nurse called and left a message on voicemail in triage. I will forwarded to [facility] (Triage assistant) to add message that was left . [name] states that she thinks her mother has a fever now. Appointment scheduled with Dr.Carter today at 1:00 pm

## 2015-09-08 NOTE — Telephone Encounter (Signed)
Candice Woodward DusterMichelle with [facility] called and stated that patient has congestion in upper lungs, Non productive cough, Low grade fever of 99.8, Decreased Appetite, Swelling Left side of jawline, jaw to ear and it is painful to touch but no redness. Please Advise.

## 2015-09-08 NOTE — Telephone Encounter (Signed)
Swelling is behind the ear and in front of ear, no redness, not filled with anything, no difficulty opening mouth. Swelling has increased since yesterday, patient will see [facility] nurse today. Please advise

## 2015-09-08 NOTE — Telephone Encounter (Signed)
See phone note dated 09/08/15 

## 2015-09-08 NOTE — Telephone Encounter (Signed)
See phone note dated 09-07-15, patient is scheduled to see Dr.Carter today at 1:00 pm

## 2015-09-08 NOTE — Telephone Encounter (Signed)
Since she cannot be seen, let's put her on augmentin 875/125mg  po bid for 7 days.  If she continues to run fevers and swelling is not improving, she will need to be seen urgently at urgent care or ED.  Hard to know what is going on without seeing her myself.  Maybe something is going on with a tooth?  Parotitis?

## 2015-09-08 NOTE — Telephone Encounter (Signed)
Is the area behind the ear or in front of the ear?  Is it red?  Does it appear to be filled with anything?  Is she having any difficulty opening her jaw?  Please get these answers and I will decide what to do.

## 2015-09-08 NOTE — Telephone Encounter (Signed)
Ok, let's see what the [facility] nurse thinks today and go from there.  Ideally it needs to be eyeballed.

## 2015-09-08 NOTE — Telephone Encounter (Signed)
Patient did not show for appointment, please advise

## 2015-09-08 NOTE — Telephone Encounter (Signed)
Left message on voicemail for patient to return call when available   

## 2015-09-09 NOTE — Telephone Encounter (Signed)
Candice Woodward RoundsSally (office triage assistant) please follow-up with patient's daughter. Patient was suppose to be seen yesterday and was unable to come in due to transportation. Inform patient's daughter of Dr.Reed's las comment

## 2015-09-10 NOTE — Telephone Encounter (Signed)
Swelling is still there and patient is now seeing doctors making house call and will no longer be under Dr.Reed's care due to transportation limitations

## 2015-09-16 ENCOUNTER — Telehealth: Payer: Self-pay | Admitting: *Deleted

## 2015-09-16 NOTE — Telephone Encounter (Signed)
LMOM for patient's daughter to return call to the office so that a cancelled appointment can be rescheduled.

## 2015-09-30 ENCOUNTER — Other Ambulatory Visit: Payer: Self-pay | Admitting: Internal Medicine

## 2015-10-01 ENCOUNTER — Encounter (HOSPITAL_BASED_OUTPATIENT_CLINIC_OR_DEPARTMENT_OTHER): Payer: Medicare Other | Attending: Internal Medicine

## 2015-10-01 DIAGNOSIS — L89613 Pressure ulcer of right heel, stage 3: Secondary | ICD-10-CM | POA: Diagnosis not present

## 2015-10-01 DIAGNOSIS — L89620 Pressure ulcer of left heel, unstageable: Secondary | ICD-10-CM | POA: Insufficient documentation

## 2015-10-01 DIAGNOSIS — G309 Alzheimer's disease, unspecified: Secondary | ICD-10-CM | POA: Diagnosis not present

## 2015-10-01 DIAGNOSIS — I10 Essential (primary) hypertension: Secondary | ICD-10-CM | POA: Insufficient documentation

## 2015-10-08 DIAGNOSIS — R41841 Cognitive communication deficit: Secondary | ICD-10-CM | POA: Diagnosis not present

## 2015-10-08 DIAGNOSIS — M6281 Muscle weakness (generalized): Secondary | ICD-10-CM | POA: Diagnosis not present

## 2015-10-08 DIAGNOSIS — F0281 Dementia in other diseases classified elsewhere with behavioral disturbance: Secondary | ICD-10-CM | POA: Diagnosis not present

## 2015-10-08 DIAGNOSIS — F429 Obsessive-compulsive disorder, unspecified: Secondary | ICD-10-CM | POA: Diagnosis not present

## 2015-10-08 DIAGNOSIS — F329 Major depressive disorder, single episode, unspecified: Secondary | ICD-10-CM | POA: Diagnosis not present

## 2015-10-08 DIAGNOSIS — L89612 Pressure ulcer of right heel, stage 2: Secondary | ICD-10-CM | POA: Diagnosis not present

## 2015-10-08 DIAGNOSIS — L89622 Pressure ulcer of left heel, stage 2: Secondary | ICD-10-CM | POA: Diagnosis not present

## 2015-10-08 DIAGNOSIS — Z96653 Presence of artificial knee joint, bilateral: Secondary | ICD-10-CM | POA: Diagnosis not present

## 2015-10-08 DIAGNOSIS — G301 Alzheimer's disease with late onset: Secondary | ICD-10-CM | POA: Diagnosis not present

## 2015-10-08 DIAGNOSIS — I10 Essential (primary) hypertension: Secondary | ICD-10-CM | POA: Diagnosis not present

## 2015-10-21 ENCOUNTER — Encounter (HOSPITAL_BASED_OUTPATIENT_CLINIC_OR_DEPARTMENT_OTHER): Payer: Medicare Other | Attending: Internal Medicine

## 2015-10-21 DIAGNOSIS — L89610 Pressure ulcer of right heel, unstageable: Secondary | ICD-10-CM | POA: Diagnosis not present

## 2015-10-21 DIAGNOSIS — F0391 Unspecified dementia with behavioral disturbance: Secondary | ICD-10-CM | POA: Diagnosis not present

## 2015-10-21 DIAGNOSIS — I10 Essential (primary) hypertension: Secondary | ICD-10-CM | POA: Insufficient documentation

## 2015-10-21 DIAGNOSIS — L89620 Pressure ulcer of left heel, unstageable: Secondary | ICD-10-CM | POA: Insufficient documentation

## 2015-10-29 ENCOUNTER — Telehealth: Payer: Self-pay | Admitting: *Deleted

## 2015-10-29 DIAGNOSIS — F0281 Dementia in other diseases classified elsewhere with behavioral disturbance: Secondary | ICD-10-CM | POA: Diagnosis not present

## 2015-10-29 DIAGNOSIS — I10 Essential (primary) hypertension: Secondary | ICD-10-CM | POA: Diagnosis not present

## 2015-10-29 DIAGNOSIS — L89620 Pressure ulcer of left heel, unstageable: Secondary | ICD-10-CM | POA: Diagnosis not present

## 2015-10-29 DIAGNOSIS — L89610 Pressure ulcer of right heel, unstageable: Secondary | ICD-10-CM | POA: Diagnosis not present

## 2015-10-29 DIAGNOSIS — Z96653 Presence of artificial knee joint, bilateral: Secondary | ICD-10-CM | POA: Diagnosis not present

## 2015-10-29 DIAGNOSIS — F329 Major depressive disorder, single episode, unspecified: Secondary | ICD-10-CM | POA: Diagnosis not present

## 2015-10-29 DIAGNOSIS — G301 Alzheimer's disease with late onset: Secondary | ICD-10-CM | POA: Diagnosis not present

## 2015-10-29 NOTE — Telephone Encounter (Signed)
Received note from Judie Petit stating that patient's [facility] plan ended and they need a new referral placed under traditional Medicare. Needs a referral for nursing wound care and a recent note. Please advise.

## 2015-10-30 ENCOUNTER — Other Ambulatory Visit: Payer: Self-pay | Admitting: Internal Medicine

## 2015-10-30 DIAGNOSIS — F02818 Dementia in other diseases classified elsewhere, unspecified severity, with other behavioral disturbance: Secondary | ICD-10-CM

## 2015-10-30 DIAGNOSIS — G301 Alzheimer's disease with late onset: Secondary | ICD-10-CM

## 2015-10-30 DIAGNOSIS — F0281 Dementia in other diseases classified elsewhere with behavioral disturbance: Secondary | ICD-10-CM

## 2015-10-30 DIAGNOSIS — L8990 Pressure ulcer of unspecified site, unspecified stage: Secondary | ICD-10-CM

## 2015-10-30 NOTE — Telephone Encounter (Signed)
I can do all of this except the recent note. She had declined so severely that her daughter was unable to bring her in since March.

## 2015-11-01 NOTE — Telephone Encounter (Signed)
Forwarded to Dorothy 

## 2015-11-04 ENCOUNTER — Encounter (HOSPITAL_BASED_OUTPATIENT_CLINIC_OR_DEPARTMENT_OTHER): Payer: Medicare Other | Attending: Internal Medicine

## 2015-11-04 DIAGNOSIS — G309 Alzheimer's disease, unspecified: Secondary | ICD-10-CM | POA: Diagnosis not present

## 2015-11-04 DIAGNOSIS — F0280 Dementia in other diseases classified elsewhere without behavioral disturbance: Secondary | ICD-10-CM | POA: Diagnosis not present

## 2015-11-04 DIAGNOSIS — L89623 Pressure ulcer of left heel, stage 3: Secondary | ICD-10-CM | POA: Insufficient documentation

## 2015-11-04 DIAGNOSIS — L89610 Pressure ulcer of right heel, unstageable: Secondary | ICD-10-CM | POA: Insufficient documentation

## 2015-11-04 DIAGNOSIS — L84 Corns and callosities: Secondary | ICD-10-CM | POA: Diagnosis not present

## 2015-11-18 DIAGNOSIS — L89623 Pressure ulcer of left heel, stage 3: Secondary | ICD-10-CM | POA: Diagnosis not present

## 2015-12-02 DIAGNOSIS — L89623 Pressure ulcer of left heel, stage 3: Secondary | ICD-10-CM | POA: Diagnosis not present

## 2015-12-16 ENCOUNTER — Encounter (HOSPITAL_BASED_OUTPATIENT_CLINIC_OR_DEPARTMENT_OTHER): Payer: Medicare Other | Attending: Internal Medicine

## 2015-12-16 DIAGNOSIS — L84 Corns and callosities: Secondary | ICD-10-CM | POA: Insufficient documentation

## 2015-12-16 DIAGNOSIS — L89613 Pressure ulcer of right heel, stage 3: Secondary | ICD-10-CM | POA: Diagnosis not present

## 2015-12-16 DIAGNOSIS — L89623 Pressure ulcer of left heel, stage 3: Secondary | ICD-10-CM | POA: Diagnosis not present

## 2015-12-16 DIAGNOSIS — G309 Alzheimer's disease, unspecified: Secondary | ICD-10-CM | POA: Diagnosis not present

## 2015-12-16 DIAGNOSIS — I10 Essential (primary) hypertension: Secondary | ICD-10-CM | POA: Insufficient documentation

## 2015-12-16 DIAGNOSIS — F0280 Dementia in other diseases classified elsewhere without behavioral disturbance: Secondary | ICD-10-CM | POA: Insufficient documentation

## 2015-12-28 DIAGNOSIS — F329 Major depressive disorder, single episode, unspecified: Secondary | ICD-10-CM | POA: Diagnosis not present

## 2015-12-28 DIAGNOSIS — I10 Essential (primary) hypertension: Secondary | ICD-10-CM | POA: Diagnosis not present

## 2015-12-28 DIAGNOSIS — Z96653 Presence of artificial knee joint, bilateral: Secondary | ICD-10-CM | POA: Diagnosis not present

## 2015-12-28 DIAGNOSIS — G301 Alzheimer's disease with late onset: Secondary | ICD-10-CM | POA: Diagnosis not present

## 2015-12-28 DIAGNOSIS — L89622 Pressure ulcer of left heel, stage 2: Secondary | ICD-10-CM | POA: Diagnosis not present

## 2015-12-28 DIAGNOSIS — L89612 Pressure ulcer of right heel, stage 2: Secondary | ICD-10-CM | POA: Diagnosis not present

## 2015-12-28 DIAGNOSIS — F0281 Dementia in other diseases classified elsewhere with behavioral disturbance: Secondary | ICD-10-CM | POA: Diagnosis not present

## 2015-12-30 DIAGNOSIS — L89613 Pressure ulcer of right heel, stage 3: Secondary | ICD-10-CM | POA: Diagnosis not present

## 2016-01-14 ENCOUNTER — Encounter (HOSPITAL_BASED_OUTPATIENT_CLINIC_OR_DEPARTMENT_OTHER): Payer: Medicare Other | Attending: Internal Medicine

## 2016-01-14 DIAGNOSIS — I10 Essential (primary) hypertension: Secondary | ICD-10-CM | POA: Diagnosis not present

## 2016-01-14 DIAGNOSIS — F0391 Unspecified dementia with behavioral disturbance: Secondary | ICD-10-CM | POA: Diagnosis not present

## 2016-01-14 DIAGNOSIS — L89613 Pressure ulcer of right heel, stage 3: Secondary | ICD-10-CM | POA: Diagnosis not present

## 2016-01-21 DIAGNOSIS — L89613 Pressure ulcer of right heel, stage 3: Secondary | ICD-10-CM | POA: Diagnosis not present

## 2016-01-28 DIAGNOSIS — L89613 Pressure ulcer of right heel, stage 3: Secondary | ICD-10-CM | POA: Diagnosis not present

## 2016-02-04 ENCOUNTER — Encounter (HOSPITAL_BASED_OUTPATIENT_CLINIC_OR_DEPARTMENT_OTHER): Payer: Medicare Other | Attending: Internal Medicine

## 2016-02-04 DIAGNOSIS — G309 Alzheimer's disease, unspecified: Secondary | ICD-10-CM | POA: Diagnosis not present

## 2016-02-04 DIAGNOSIS — L89623 Pressure ulcer of left heel, stage 3: Secondary | ICD-10-CM | POA: Diagnosis present

## 2016-02-04 DIAGNOSIS — F0280 Dementia in other diseases classified elsewhere without behavioral disturbance: Secondary | ICD-10-CM | POA: Diagnosis not present

## 2016-02-04 DIAGNOSIS — I10 Essential (primary) hypertension: Secondary | ICD-10-CM | POA: Diagnosis not present

## 2016-02-04 DIAGNOSIS — B9561 Methicillin susceptible Staphylococcus aureus infection as the cause of diseases classified elsewhere: Secondary | ICD-10-CM | POA: Insufficient documentation

## 2016-02-11 DIAGNOSIS — L89623 Pressure ulcer of left heel, stage 3: Secondary | ICD-10-CM | POA: Diagnosis not present

## 2016-02-26 DIAGNOSIS — I10 Essential (primary) hypertension: Secondary | ICD-10-CM | POA: Diagnosis not present

## 2016-02-26 DIAGNOSIS — Z96653 Presence of artificial knee joint, bilateral: Secondary | ICD-10-CM | POA: Diagnosis not present

## 2016-02-26 DIAGNOSIS — F0281 Dementia in other diseases classified elsewhere with behavioral disturbance: Secondary | ICD-10-CM | POA: Diagnosis not present

## 2016-02-26 DIAGNOSIS — G301 Alzheimer's disease with late onset: Secondary | ICD-10-CM | POA: Diagnosis not present

## 2016-02-26 DIAGNOSIS — L89622 Pressure ulcer of left heel, stage 2: Secondary | ICD-10-CM | POA: Diagnosis not present

## 2016-02-26 DIAGNOSIS — F329 Major depressive disorder, single episode, unspecified: Secondary | ICD-10-CM | POA: Diagnosis not present

## 2016-03-02 ENCOUNTER — Telehealth: Payer: Self-pay | Admitting: Internal Medicine

## 2016-03-02 DIAGNOSIS — L89623 Pressure ulcer of left heel, stage 3: Secondary | ICD-10-CM | POA: Diagnosis not present

## 2016-03-02 NOTE — Telephone Encounter (Signed)
left msg asking pt to call and schedule AWV and CPE. I didn't schedule a set time and date since there are no recent visits.

## 2016-03-29 ENCOUNTER — Telehealth: Payer: Self-pay | Admitting: *Deleted

## 2016-03-29 NOTE — Telephone Encounter (Signed)
Judie PetitLisa Hart with [facility] came by the office and dropped off a referral form that needs to be signed and also needs order placed in EPIC. Patient needs new Home Health order due to Insurance. Judie PetitLisa Hart from [facility] will notify family that patient needs an appointment for a new Face to Face. Need order placed. Please Advise.   (Paper order placed in Dr. Ernest Mallickeed's folder to review and also sign)

## 2016-03-31 NOTE — Telephone Encounter (Signed)
Form was completed yesterday afternoon and placed on [facility] triage desk.

## 2016-03-31 NOTE — Telephone Encounter (Signed)
Form Faxed to [facility]

## 2016-04-13 ENCOUNTER — Telehealth: Payer: Self-pay

## 2016-04-13 NOTE — Telephone Encounter (Signed)
Spoke with Sherre Lainenise, [name] was informed we received request for medical records on 09/08/15 and records were sent the following day.  Suzette in Billing was able to access the previous medical records system and no record of pneumonia vaccine.

## 2016-04-13 NOTE — Telephone Encounter (Signed)
Message left on clinical intake voicemail:   Candice Woodward with [facility] left message requesting verbal orders for wound care and disease process management.   I returned call, left message authorizing verbal order per [facility] standing protocol.  Last OV 07/01/15, I called patient's daughter to schedule an appointment and she stated patient is no longer under Dr.Reed's care. Patient is under the care of [facility].  [name] questioned why patient's medical records were not sent to [facility]. [name] aware Bo MerinoGeri in Medical Records will follow-up on this concern.  [name] also inquired about a Systems developer8 dollar bill, I spoke with [place]uz[place] and she advised that bill was sent and [employer] insurance company later approved claim. No current balance  Denis also asked about immunization record and I shared all vaccines and dates. [name] was certain that her mother had a pneumonia vaccine between 2011-2013. Suzette in billing will check the previous medical record system to follow-up on other vaccines administered through [facility].     I then called Candice Woodward back and informed her to disregard the message ok'ing verbal order and she needs to follow-up with the patient's daughter for new PCP information.

## 2016-04-27 ENCOUNTER — Encounter (HOSPITAL_BASED_OUTPATIENT_CLINIC_OR_DEPARTMENT_OTHER): Payer: Medicare Other | Attending: Internal Medicine

## 2016-04-27 DIAGNOSIS — Z09 Encounter for follow-up examination after completed treatment for conditions other than malignant neoplasm: Secondary | ICD-10-CM | POA: Diagnosis not present

## 2016-04-27 DIAGNOSIS — Z872 Personal history of diseases of the skin and subcutaneous tissue: Secondary | ICD-10-CM | POA: Insufficient documentation

## 2016-04-27 DIAGNOSIS — F0390 Unspecified dementia without behavioral disturbance: Secondary | ICD-10-CM | POA: Insufficient documentation

## 2016-08-06 ENCOUNTER — Inpatient Hospital Stay (HOSPITAL_COMMUNITY)
Admission: EM | Admit: 2016-08-06 | Discharge: 2016-08-10 | DRG: 100 | Disposition: A | Discharge status: Hospice | Payer: Medicare Other | Attending: Internal Medicine | Admitting: Internal Medicine

## 2016-08-06 ENCOUNTER — Emergency Department (HOSPITAL_COMMUNITY): Payer: Medicare Other

## 2016-08-06 ENCOUNTER — Encounter (HOSPITAL_COMMUNITY): Payer: Self-pay | Admitting: Emergency Medicine

## 2016-08-06 DIAGNOSIS — G4089 Other seizures: Principal | ICD-10-CM | POA: Diagnosis present

## 2016-08-06 DIAGNOSIS — R638 Other symptoms and signs concerning food and fluid intake: Secondary | ICD-10-CM | POA: Diagnosis present

## 2016-08-06 DIAGNOSIS — K5900 Constipation, unspecified: Secondary | ICD-10-CM | POA: Diagnosis not present

## 2016-08-06 DIAGNOSIS — R739 Hyperglycemia, unspecified: Secondary | ICD-10-CM

## 2016-08-06 DIAGNOSIS — I10 Essential (primary) hypertension: Secondary | ICD-10-CM | POA: Diagnosis present

## 2016-08-06 DIAGNOSIS — G301 Alzheimer's disease with late onset: Secondary | ICD-10-CM

## 2016-08-06 DIAGNOSIS — R402222 Coma scale, best verbal response, incomprehensible words, at arrival to emergency department: Secondary | ICD-10-CM | POA: Diagnosis present

## 2016-08-06 DIAGNOSIS — R569 Unspecified convulsions: Secondary | ICD-10-CM | POA: Diagnosis not present

## 2016-08-06 DIAGNOSIS — E119 Type 2 diabetes mellitus without complications: Secondary | ICD-10-CM

## 2016-08-06 DIAGNOSIS — N179 Acute kidney failure, unspecified: Secondary | ICD-10-CM | POA: Diagnosis not present

## 2016-08-06 DIAGNOSIS — Z7189 Other specified counseling: Secondary | ICD-10-CM

## 2016-08-06 DIAGNOSIS — R402342 Coma scale, best motor response, flexion withdrawal, at arrival to emergency department: Secondary | ICD-10-CM | POA: Diagnosis present

## 2016-08-06 DIAGNOSIS — T464X5A Adverse effect of angiotensin-converting-enzyme inhibitors, initial encounter: Secondary | ICD-10-CM | POA: Diagnosis present

## 2016-08-06 DIAGNOSIS — E872 Acidosis, unspecified: Secondary | ICD-10-CM

## 2016-08-06 DIAGNOSIS — T502X5A Adverse effect of carbonic-anhydrase inhibitors, benzothiadiazides and other diuretics, initial encounter: Secondary | ICD-10-CM | POA: Diagnosis present

## 2016-08-06 DIAGNOSIS — G9340 Encephalopathy, unspecified: Secondary | ICD-10-CM | POA: Diagnosis not present

## 2016-08-06 DIAGNOSIS — Z96653 Presence of artificial knee joint, bilateral: Secondary | ICD-10-CM | POA: Diagnosis present

## 2016-08-06 DIAGNOSIS — G309 Alzheimer's disease, unspecified: Secondary | ICD-10-CM | POA: Diagnosis not present

## 2016-08-06 DIAGNOSIS — R400 Somnolence: Secondary | ICD-10-CM | POA: Diagnosis present

## 2016-08-06 DIAGNOSIS — Z66 Do not resuscitate: Secondary | ICD-10-CM | POA: Diagnosis not present

## 2016-08-06 DIAGNOSIS — R402112 Coma scale, eyes open, never, at arrival to emergency department: Secondary | ICD-10-CM | POA: Diagnosis present

## 2016-08-06 DIAGNOSIS — Z515 Encounter for palliative care: Secondary | ICD-10-CM | POA: Diagnosis not present

## 2016-08-06 DIAGNOSIS — E876 Hypokalemia: Secondary | ICD-10-CM | POA: Diagnosis not present

## 2016-08-06 DIAGNOSIS — Z79899 Other long term (current) drug therapy: Secondary | ICD-10-CM | POA: Diagnosis not present

## 2016-08-06 DIAGNOSIS — E1165 Type 2 diabetes mellitus with hyperglycemia: Secondary | ICD-10-CM | POA: Diagnosis present

## 2016-08-06 DIAGNOSIS — F0280 Dementia in other diseases classified elsewhere without behavioral disturbance: Secondary | ICD-10-CM | POA: Diagnosis not present

## 2016-08-06 DIAGNOSIS — D72829 Elevated white blood cell count, unspecified: Secondary | ICD-10-CM | POA: Diagnosis present

## 2016-08-06 DIAGNOSIS — E039 Hypothyroidism, unspecified: Secondary | ICD-10-CM | POA: Diagnosis present

## 2016-08-06 DIAGNOSIS — F0390 Unspecified dementia without behavioral disturbance: Secondary | ICD-10-CM

## 2016-08-06 DIAGNOSIS — E870 Hyperosmolality and hypernatremia: Secondary | ICD-10-CM | POA: Diagnosis present

## 2016-08-06 DIAGNOSIS — E860 Dehydration: Secondary | ICD-10-CM | POA: Diagnosis present

## 2016-08-06 DIAGNOSIS — F0281 Dementia in other diseases classified elsewhere with behavioral disturbance: Secondary | ICD-10-CM | POA: Diagnosis present

## 2016-08-06 LAB — CBC WITH DIFFERENTIAL/PLATELET
BASOS ABS: 0 10*3/uL (ref 0.0–0.1)
Basophils Relative: 0 %
EOS PCT: 1 %
Eosinophils Absolute: 0.1 10*3/uL (ref 0.0–0.7)
HEMATOCRIT: 43.3 % (ref 36.0–46.0)
HEMOGLOBIN: 14.4 g/dL (ref 12.0–15.0)
LYMPHS ABS: 2.1 10*3/uL (ref 0.7–4.0)
LYMPHS PCT: 15 %
MCH: 29.7 pg (ref 26.0–34.0)
MCHC: 33.3 g/dL (ref 30.0–36.0)
MCV: 89.3 fL (ref 78.0–100.0)
Monocytes Absolute: 0.6 10*3/uL (ref 0.1–1.0)
Monocytes Relative: 4 %
NEUTROS ABS: 10.7 10*3/uL — AB (ref 1.7–7.7)
NEUTROS PCT: 80 %
Platelets: 274 10*3/uL (ref 150–400)
RBC: 4.85 MIL/uL (ref 3.87–5.11)
RDW: 15.1 % (ref 11.5–15.5)
WBC: 13.5 10*3/uL — AB (ref 4.0–10.5)

## 2016-08-06 LAB — BLOOD GAS, VENOUS
ACID-BASE EXCESS: 0.1 mmol/L (ref 0.0–2.0)
BICARBONATE: 23.2 mmol/L (ref 20.0–28.0)
O2 Saturation: 87.1 %
PH VEN: 7.438 — AB (ref 7.250–7.430)
PO2 VEN: 57.5 mmHg — AB (ref 32.0–45.0)
Patient temperature: 98.6
pCO2, Ven: 34.9 mmHg — ABNORMAL LOW (ref 44.0–60.0)

## 2016-08-06 LAB — COMPREHENSIVE METABOLIC PANEL
ALT: 17 U/L (ref 14–54)
AST: 30 U/L (ref 15–41)
Albumin: 4.1 g/dL (ref 3.5–5.0)
Alkaline Phosphatase: 95 U/L (ref 38–126)
Anion gap: 14 (ref 5–15)
BUN: 37 mg/dL — AB (ref 6–20)
CHLORIDE: 109 mmol/L (ref 101–111)
CO2: 22 mmol/L (ref 22–32)
CREATININE: 1.74 mg/dL — AB (ref 0.44–1.00)
Calcium: 9.6 mg/dL (ref 8.9–10.3)
GFR, EST AFRICAN AMERICAN: 31 mL/min — AB (ref >60)
GFR, EST NON AFRICAN AMERICAN: 27 mL/min — AB (ref >60)
Glucose, Bld: 701 mg/dL (ref 65–99)
Potassium: 4.8 mmol/L (ref 3.5–5.1)
Sodium: 145 mmol/L (ref 135–145)
Total Bilirubin: 1.5 mg/dL — ABNORMAL HIGH (ref 0.3–1.2)
Total Protein: 8.3 g/dL — ABNORMAL HIGH (ref 6.5–8.1)

## 2016-08-06 LAB — URINALYSIS, ROUTINE W REFLEX MICROSCOPIC
BACTERIA UA: NONE SEEN
BILIRUBIN URINE: NEGATIVE
Glucose, UA: >=500 mg/dL — AB
Ketones, ur: NEGATIVE mg/dL
Leukocytes, UA: NEGATIVE
NITRITE: NEGATIVE
PH: 5 (ref 5.0–8.0)
Protein, ur: NEGATIVE mg/dL
SPECIFIC GRAVITY, URINE: 1.031 — AB (ref 1.005–1.030)
Squamous Epithelial / LPF: NONE SEEN

## 2016-08-06 LAB — MRSA PCR SCREENING: MRSA BY PCR: NEGATIVE

## 2016-08-06 LAB — GLUCOSE, CAPILLARY: GLUCOSE-CAPILLARY: 544 mg/dL — AB (ref 65–99)

## 2016-08-06 LAB — I-STAT CG4 LACTIC ACID, ED: Lactic Acid, Venous: 3.6 mmol/L (ref 0.5–1.9)

## 2016-08-06 LAB — I-STAT TROPONIN, ED: TROPONIN I, POC: 0 ng/mL (ref 0.00–0.08)

## 2016-08-06 LAB — CBG MONITORING, ED: Glucose-Capillary: 587 mg/dL (ref 65–99)

## 2016-08-06 MED ORDER — SODIUM CHLORIDE 0.9% FLUSH
3.0000 mL | Freq: Two times a day (BID) | INTRAVENOUS | Status: DC
  Administered 2016-08-07 – 2016-08-09 (×3): 3 mL via INTRAVENOUS

## 2016-08-06 MED ORDER — INSULIN ASPART 100 UNIT/ML ~~LOC~~ SOLN
0.0000 [IU] | Freq: Three times a day (TID) | SUBCUTANEOUS | Status: DC
Start: 2016-08-07 — End: 2016-08-07
  Administered 2016-08-07: 11 [IU] via SUBCUTANEOUS

## 2016-08-06 MED ORDER — VANCOMYCIN HCL IN DEXTROSE 1-5 GM/200ML-% IV SOLN
1000.0000 mg | Freq: Once | INTRAVENOUS | Status: AC
  Administered 2016-08-06: 1000 mg via INTRAVENOUS
  Filled 2016-08-06: qty 200

## 2016-08-06 MED ORDER — PIPERACILLIN-TAZOBACTAM 3.375 G IVPB: 3.3750 g | Freq: Three times a day (TID) | INTRAVENOUS | Status: DC

## 2016-08-06 MED ORDER — BISACODYL 5 MG PO TBEC
5.0000 mg | DELAYED_RELEASE_TABLET | Freq: Every day | ORAL | Status: DC | PRN
  Filled 2016-08-06: qty 1

## 2016-08-06 MED ORDER — VALPROATE SODIUM 500 MG/5ML IV SOLN
250.0000 mg | Freq: Two times a day (BID) | INTRAVENOUS | Status: DC
  Administered 2016-08-07 – 2016-08-08 (×3): 250 mg via INTRAVENOUS
  Filled 2016-08-06 (×3): qty 2.5

## 2016-08-06 MED ORDER — IPRATROPIUM-ALBUTEROL 0.5-2.5 (3) MG/3ML IN SOLN
2.5000 mL | Freq: Three times a day (TID) | RESPIRATORY_TRACT | Status: DC | PRN
  Administered 2016-08-10: 2.5 mL via RESPIRATORY_TRACT
  Filled 2016-08-06: qty 3

## 2016-08-06 MED ORDER — INSULIN ASPART 100 UNIT/ML ~~LOC~~ SOLN: 10.0000 [IU] | Freq: Once | SUBCUTANEOUS | Status: DC

## 2016-08-06 MED ORDER — PIPERACILLIN-TAZOBACTAM 3.375 G IVPB 30 MIN
3.3750 g | Freq: Once | INTRAVENOUS | Status: AC
  Administered 2016-08-06: 3.375 g via INTRAVENOUS
  Filled 2016-08-06: qty 50

## 2016-08-06 MED ORDER — ACETAMINOPHEN 325 MG PO TABS: 650.0000 mg | ORAL_TABLET | Freq: Four times a day (QID) | ORAL | Status: DC | PRN

## 2016-08-06 MED ORDER — LORAZEPAM 2 MG/ML IJ SOLN: 2.0000 mg | INTRAMUSCULAR | Status: DC | PRN

## 2016-08-06 MED ORDER — SODIUM CHLORIDE 0.9 % IV BOLUS (SEPSIS)
1000.0000 mL | Freq: Once | INTRAVENOUS | Status: AC
  Administered 2016-08-06: 1000 mL via INTRAVENOUS

## 2016-08-06 MED ORDER — ONDANSETRON HCL 4 MG/2ML IJ SOLN
4.0000 mg | Freq: Four times a day (QID) | INTRAMUSCULAR | Status: DC | PRN
Start: 2016-08-06 — End: 2016-08-10

## 2016-08-06 MED ORDER — ONDANSETRON HCL 4 MG PO TABS: 4.0000 mg | ORAL_TABLET | Freq: Four times a day (QID) | ORAL | Status: DC | PRN

## 2016-08-06 MED ORDER — DEXTROSE 5 % IV SOLN
500.0000 mg | Freq: Once | INTRAVENOUS | Status: AC
  Administered 2016-08-06: 500 mg via INTRAVENOUS
  Filled 2016-08-06: qty 5

## 2016-08-06 MED ORDER — LEVOTHYROXINE SODIUM 100 MCG PO TABS
100.0000 ug | ORAL_TABLET | Freq: Every day | ORAL | Status: DC
  Administered 2016-08-09 – 2016-08-10 (×2): 100 ug via ORAL
  Filled 2016-08-06 (×4): qty 1

## 2016-08-06 MED ORDER — ZOLPIDEM TARTRATE 5 MG PO TABS: 5.0000 mg | ORAL_TABLET | Freq: Every evening | ORAL | Status: DC | PRN

## 2016-08-06 MED ORDER — INSULIN ASPART 100 UNIT/ML ~~LOC~~ SOLN
15.0000 [IU] | Freq: Once | SUBCUTANEOUS | Status: AC
  Administered 2016-08-07: 15 [IU] via SUBCUTANEOUS

## 2016-08-06 MED ORDER — HALOPERIDOL LACTATE 5 MG/ML IJ SOLN: 2.0000 mg | Freq: Four times a day (QID) | INTRAMUSCULAR | Status: DC | PRN

## 2016-08-06 MED ORDER — LORAZEPAM 2 MG/ML IJ SOLN
2.0000 mg | Freq: Once | INTRAMUSCULAR | Status: AC
  Administered 2016-08-06: 2 mg via INTRAVENOUS
  Filled 2016-08-06: qty 1

## 2016-08-06 MED ORDER — VANCOMYCIN HCL IN DEXTROSE 750-5 MG/150ML-% IV SOLN: 750.0000 mg | INTRAVENOUS | Status: DC

## 2016-08-06 MED ORDER — SODIUM CHLORIDE 0.9 % IV BOLUS (SEPSIS)
250.0000 mL | Freq: Once | INTRAVENOUS | Status: AC
  Administered 2016-08-06: 250 mL via INTRAVENOUS

## 2016-08-06 MED ORDER — HEPARIN SODIUM (PORCINE) 5000 UNIT/ML IJ SOLN
5000.0000 [IU] | Freq: Three times a day (TID) | INTRAMUSCULAR | Status: DC
  Administered 2016-08-06 – 2016-08-10 (×12): 5000 [IU] via SUBCUTANEOUS
  Filled 2016-08-06 (×12): qty 1

## 2016-08-06 MED ORDER — SODIUM CHLORIDE 0.9 % IV SOLN
1500.0000 mg | Freq: Once | INTRAVENOUS | Status: AC
  Administered 2016-08-06: 1500 mg via INTRAVENOUS
  Filled 2016-08-06: qty 15

## 2016-08-06 MED ORDER — LACTATED RINGERS IV SOLN
INTRAVENOUS | Status: DC
  Administered 2016-08-06: 1000 mL via INTRAVENOUS

## 2016-08-06 MED ORDER — SODIUM CHLORIDE 0.9 % IV BOLUS (SEPSIS): 1000.0000 mL | Freq: Once | INTRAVENOUS | Status: DC

## 2016-08-06 MED ORDER — ACETAMINOPHEN 650 MG RE SUPP: 650.0000 mg | Freq: Four times a day (QID) | RECTAL | Status: DC | PRN

## 2016-08-06 MED ORDER — HYDRALAZINE HCL 20 MG/ML IJ SOLN
5.0000 mg | INTRAMUSCULAR | Status: DC | PRN
  Administered 2016-08-08: 5 mg via INTRAVENOUS
  Filled 2016-08-06: qty 1

## 2016-08-06 NOTE — ED Notes (Signed)
Hospitalist at bedside 

## 2016-08-06 NOTE — Progress Notes (Signed)
Pt CBG is 544, contacted MD, waiting on response, will cont to monitor.  MD order 15u of insulin and it was given, will recheck CBG late, Thanks.

## 2016-08-06 NOTE — Consult Note (Signed)
Neurology Consult Note  Reason for Consultation: seizure   Requesting provider: Doreatha Lew, MD  CC: Unable to obtain as patient is nonverbal at baseline and presently more encephalopathic than usual; history is therefore from the review of medical records and from d/w ED provider as she is unable to provide and no family is present at the time of my visit  HPI: This is a 82 year old woman with history of advanced Alzheimer's disease who presented to the [facility] emergency department when she was noted to be less responsive than usual by to her daughter. This has been going on for the past 2 days. Last night her daughter noticed that she was having some unusual movement and jerking of her arm. She was brought to the emergency department where she was noted to have jerking movements involving both arms. Initial labs included a lactic acid of 3.6, white blood cell count of 13.5K, and glucose 701 with normal bicarbonate. She is been getting ciprofloxacin for the past 2 days for treatment of urinary tract infection. She has no reported history of seizures.  Daughter reported to the ED provider that the patient is typically minimally verbal at baseline because of her dementia. She will open her eyes and responds with grunts and other sounds. These have become less apparent over the past 2-3 days. She is noted that the patient's left arm "going into spasms for a few minutes at a time."  I met the patient as she arrived on 4E at [facility]. She is nonverbal, moaning and grunting occasionally with no observed seizures.   PMH: as per chart review as she is nonverbal and unable to provide. No family is present.  Past Medical History:  Diagnosis Date  . Acute bronchitis   . Alzheimer disease   . Alzheimer's disease   . Anxiety state, unspecified   . Dementia in conditions classified elsewhere with behavioral disturbance   . Disorder of bone and cartilage, unspecified   . Dizziness and  giddiness   . Hypertension   . IBS (irritable bowel syndrome)   . Irritable bowel syndrome   . Lumbago   . Malnutrition of mild degree ([place])   . Obsessive-compulsive disorders   . Other and unspecified hyperlipidemia   . Personal history of urinary (tract) infection   . Rickets, active   . Senile dementia, uncomplicated   . Thyroid disease     PSH: as per chart review as she is nonverbal and unable to provide. No family is present.  Past Surgical History:  Procedure Laterality Date  . PARTIAL HYSTERECTOMY    . REPLACEMENT TOTAL KNEE     bilateral  . THYROID SURGERY      Family history: as per chart review as she is nonverbal and unable to provide. No family is present.  Family History  Problem Relation Age of Onset  . Aneurysm Mother     abdominal aortic  . Cancer Father     bone  . Cancer Sister     colon  . Diabetes Sister     Social history: as per chart review as she is nonverbal and unable to provide. No family is present.  Social History   Social History  . Marital status: Widowed    Spouse name: N/A  . Number of children: N/A  . Years of education: N/A   Occupational History  . Not on file.   Social History Main Topics  . Smoking status: Never Smoker  . Smokeless tobacco:  Current User    Types: Snuff  . Alcohol use No  . Drug use: No  . Sexual activity: Not on file   Other Topics Concern  . Not on file   Social History Narrative  . No narrative on file    Current outpatient meds: Medications reviewed and reconciled.  Current Meds  Medication Sig  . ciprofloxacin (CIPRO) 500 MG tablet Take 500 mg by mouth 2 (two) times daily.  . divalproex (DEPAKOTE SPRINKLE) 125 MG capsule TAKE ONE CAPSULE BY MOUTH TWICE A DAY FOR BEHAVIOR  . ipratropium-albuterol (DUONEB) 0.5-2.5 (3) MG/3ML SOLN Inhale 2.5 mLs into the lungs every 8 (eight) hours as needed for shortness of breath.  . levothyroxine (SYNTHROID, LEVOTHROID) 100 MCG tablet TAKE 1 TABLET BY MOUTH  FOR THYROID SUPPLEMENT  . lisinopril-hydrochlorothiazide (PRINZIDE,ZESTORETIC) 20-25 MG tablet TAKE 1 TABLET BY MOUTH EVERY DAY  . sertraline (ZOLOFT) 100 MG tablet TAKE 2 TABLETS BY MOUTH EVERY DAY (Patient taking differently: take 1 tablet by mouth once daily)    Current inpatient meds: Medications reviewed and reconciled.  Current Facility-Administered Medications  Medication Dose Route Frequency Provider Last Rate Last Dose  . acetaminophen (TYLENOL) tablet 650 mg  650 mg Oral Q6H PRN Silva Zapata, Edwin, MD       Or  . acetaminophen (TYLENOL) suppository 650 mg  650 mg Rectal Q6H PRN Silva Zapata, Edwin, MD      . bisacodyl (DULCOLAX) EC tablet 5 mg  5 mg Oral Daily PRN Silva Zapata, Edwin, MD      . haloperidol lactate (HALDOL) injection 2 mg  2 mg Intravenous Q6H PRN Silva Zapata, Edwin, MD      . heparin injection 5,000 Units  5,000 Units Subcutaneous Q8H Silva Zapata, Edwin, MD      . hydrALAZINE (APRESOLINE) injection 5 mg  5 mg Intravenous Q4H PRN Silva Zapata, Edwin, MD      . [START ON 08/07/2016] insulin aspart (novoLOG) injection 0-15 Units  0-15 Units Subcutaneous TID WC Silva Zapata, Edwin, MD      . insulin aspart (novoLOG) injection 10 Units  10 Units Subcutaneous Once Silva Zapata, Edwin, MD      . ipratropium-albuterol (DUONEB) 0.5-2.5 (3) MG/3ML nebulizer solution 2.5 mL  2.5 mL Inhalation Q8H PRN Silva Zapata, Edwin, MD      . lactated ringers infusion   Intravenous Continuous Silva Zapata, Edwin, MD      . [START ON 08/07/2016] levothyroxine (SYNTHROID, LEVOTHROID) tablet 100 mcg  100 mcg Oral QAC breakfast Silva Zapata, Edwin, MD      . LORazepam (ATIVAN) injection 2 mg  2 mg Intravenous PRN Silva Zapata, Edwin, MD      . ondansetron (ZOFRAN) tablet 4 mg  4 mg Oral Q6H PRN Silva Zapata, Edwin, MD       Or  . ondansetron (ZOFRAN) injection 4 mg  4 mg Intravenous Q6H PRN Silva Zapata, Edwin, MD      . sodium chloride flush (NS) 0.9 % injection 3 mL  3 mL Intravenous Q12H  Silva Zapata, Edwin, MD      . zolpidem (AMBIEN) tablet 5 mg  5 mg Oral QHS PRN Silva Zapata, Edwin, MD       Current Outpatient Prescriptions  Medication Sig Dispense Refill  . ciprofloxacin (CIPRO) 500 MG tablet Take 500 mg by mouth 2 (two) times daily.  0  . divalproex (DEPAKOTE SPRINKLE) 125 MG capsule TAKE ONE CAPSULE BY MOUTH TWICE A DAY FOR BEHAVIOR 60 capsule 4  .   ipratropium-albuterol (DUONEB) 0.5-2.5 (3) MG/3ML SOLN Inhale 2.5 mLs into the lungs every 8 (eight) hours as needed for shortness of breath.  3  . levothyroxine (SYNTHROID, LEVOTHROID) 100 MCG tablet TAKE 1 TABLET BY MOUTH FOR THYROID SUPPLEMENT 30 tablet 3  . lisinopril-hydrochlorothiazide (PRINZIDE,ZESTORETIC) 20-25 MG tablet TAKE 1 TABLET BY MOUTH EVERY DAY 30 tablet 5  . sertraline (ZOLOFT) 100 MG tablet TAKE 2 TABLETS BY MOUTH EVERY DAY (Patient taking differently: take 1 tablet by mouth once daily) 60 tablet 5    Allergies: No Known Allergies  ROS: As per HPI. A full 14-point review of systems could not be obtained as she is nonverbal and unable to provide.   PE:  BP (!) 159/73   Pulse 98   Temp 99.2 F (37.3 C) (Rectal)   Resp (!) 32   Ht 5' 5" (1.651 m)   Wt 70.3 kg (155 lb)   SpO2 96%   BMI 25.79 kg/m   General: WDWN African-American woman lying in bed. She is nonverbal, moaning and grunting at times. She does not follow commands.   HEENT: Normocephalic. Neck supple without LAD. Sclerae anicteric. No conjunctival injection.  CV: Regular, no obvious murmur. Carotid pulses full and symmetric, no bruits.  Lungs: CTAB on anterior auscultation.  Abdomen: Soft, obese, non-distended, non-tender. Bowel sounds present x4.  Extremities: No C/C/E. Neuro:  CN: Pupils are equal and round. She appears to blink to visual threat. Eyes are midposition, no forced deviation and no nystagmus. Corneals are intact. She grimaces to supraorbital pressure bilaterally. Her grimace is symmetric. The remainder of her cranial  nerves cannot be assessed as she does not participate with the exam.  Motor: Normal bulk for age. She has mildly increased tone vs gegenhalten paratonia. She does not participate with confrontational testing. No tremor or other abnormal movements. No seizures.  Sensation: She moans and localizes noxious stimulation x4.  DTRs: 2+, symmetric. Toes downgoing bilaterally. Coordination/gait: Unable to assess as she does not participate with testing.   Labs:  Lab Results  Component Value Date   WBC 13.5 (H) 08/06/2016   HGB 14.4 08/06/2016   HCT 43.3 08/06/2016   PLT 274 08/06/2016   GLUCOSE 701 (HH) 08/06/2016   ALT 17 08/06/2016   AST 30 08/06/2016   NA 145 08/06/2016   K 4.8 08/06/2016   CL 109 08/06/2016   CREATININE 1.74 (H) 08/06/2016   BUN 37 (H) 08/06/2016   CO2 22 08/06/2016   TSH 4.470 08/13/2014   Lactic acid 3.60 Troponin 0.00 Venous blood gas: PH 7.43, PCO2 34.9, PO2 57.5, bicarbonate 23.2 UA with specific gravity 1.031, glucose greater than 500, small hemoglobin, negative nitrates, negative leukocyte esterase   Imaging:  I have personally and independently reviewed CT scan of the head without contrast from today. This shows severe diffuse generalized atrophy. There is prominent wasting of the medial temporal lobes consistent with her underlying diagnosis of Alzheimer's disease. There is diffuse ventricular enlargement which seems to be mildly progressed when compared to her previous scan from 06/21/15. This seems to be relatively out of proportion to the degree of cortical atrophy and may suggest an element of obstructive hydrocephalus. However, she also has extensive chronic small vessel ischemic disease so there may be some ex vacuo dilatation of the ventricles is a result of this. No acute pathology is appreciated.  Assessment and Plan:  1. Seizures: Presentation is most suggestive of partial seizure activity. She is at risk for seizures given the extent of   underlying  small vessel disease and her history of Alzheimer's disease. It is likely that her current episodes of unprovoked by the use of ciprofloxacin and possibly by her extreme hyperglycemia and associated hyperosmolarity. I was contacted previously by the ED providers and recommended that she be loaded with Keppra. Recommend gradually lowering glucose, avoiding drastic reduction to minimize risk of central myelolysis. Ciprofloxacin should be discontinued. If antibiotics are required, fluoroquinolones are best avoided. Carbidopa numbs also carry risk of lowering seizure threshold and would not be recommended either. Seizure precautions. On reviewing the chart, she appears to have some behavioral issues related to her dementia. She has been on Depakote in the past--per notes this was 125 mg daily. This can be continued but will load with 500 mg and then continue 250 mg BID, at least until her metabolic issues and antibiotics have been adequately addressed. Keppra may actually worsen her behavior and therefore will not be continued.  2. Acute encephalopathy: This could be multifactorial with contributions from UTI, medication effect, seizures, and metabolic derangements including markedly elevated glucose and renal impairment. Optimize metabolic status. Continue to treat infection as needed but avoid fluoroquinolones and carbapenems as noted above.   3. Dementia: She has advanced dementia at baseline. She carries a diagnosis of Alzheimer's disease. The degree of small vessel disease on her imaging would suggest this may be more of a mixed picture with both Alzheimer's and vascular contributions. This places her at risk for encephalopathy from causes and would predict delayed recovery from same. She may have unfortunately had a new baseline after this acute illness but only time will tell. Her dementia is quite profound and she is nonverbal at baseline.  No family was present at the time of my visit.

## 2016-08-06 NOTE — Progress Notes (Signed)
Pharmacy Antibiotic Note  Candice Woodward is a 82 y.o. female admitted on 08/06/2016 with sepsis and UTI.  Pharmacy has been consulted for Vanc/Zosyn dosing.  Plan: Vancomycin 1g x 1 then 750mg  IV every 24 hours.  Goal trough 15-20 mcg/mL. Zosyn 3.375g IV q8h (4 hour infusion).  Height: 5\' 5"  (165.1 cm) Weight: 155 lb (70.3 kg) IBW/kg (Calculated) : 57  Temp (24hrs), Avg:99.2 F (37.3 C), Min:99.2 F (37.3 C), Max:99.2 F (37.3 C)   Recent Labs Lab 08/06/16 1442 08/06/16 1517  WBC 13.5*  --   CREATININE 1.74*  --   LATICACIDVEN  --  3.60*    Estimated Creatinine Clearance: 25.8 mL/min (A) (by C-G formula based on SCr of 1.74 mg/dL (H)).    No Known Allergies   Thank you for allowing pharmacy to be a part of this patient's care.   Hessie KnowsJustin M Melani Brisbane, PharmD, BCPS Pager [phone] 08/06/2016 4:38 PM

## 2016-08-06 NOTE — H&P (Addendum)
History and Physical    Candice Woodward HYQ:[id number] DOB: [birth year]  DOA: 08/06/2016 PCP: Jiles Garter, FNP  Patient coming from: Home   Chief Complaint: Abnormal upper extremities movement.   HPI: Candice Woodward is a 82 y.o. female with medical history significant of severe Alzheimer, hypertension, hypothyroidism presented to the emergency department after daughter noticing patient being less interactive than normal. Per daughter patient has decreased her interaction since 2 days ago where she stopped eating. Last night she noticed some abnormal upper extremity movement/jerking. Patient was treated with Cipro for presumed UTI for 2 days. Daughter also reported URI symptoms with nasal congestion and cough that has been going on for couple of months now. Daughter denies that patient had fever, shortness of breath, chest pain, nausea or vomiting.  ED Course: Patient was noted to be dehydrated, with jerking movement in both upper extremities. EDP consulted neurologist recommended loading with Keppra and Ativan when necessary and transferred to [facility]. Creatinine elevated 1.74, lactic acid 3.6, WBC of 13.5, hyperglycemia with glucose of 701 no new no anion gap and  bicarbonate is normal  Review of Systems: Unable to perform even patient severe dementia   Past Medical History:  Diagnosis Date  . Acute bronchitis   . Alzheimer disease   . Alzheimer's disease   . Anxiety state, unspecified   . Dementia in conditions classified elsewhere with behavioral disturbance   . Disorder of bone and cartilage, unspecified   . Dizziness and giddiness   . Hypertension   . IBS (irritable bowel syndrome)   . Irritable bowel syndrome   . Lumbago   . Malnutrition of mild degree (HCC)   . Obsessive-compulsive disorders   . Other and unspecified hyperlipidemia   . Personal history of urinary (tract) infection   . Rickets, active   . Senile dementia, uncomplicated   . Thyroid disease     Past  Surgical History:  Procedure Laterality Date  . PARTIAL HYSTERECTOMY    . REPLACEMENT TOTAL KNEE     bilateral  . THYROID SURGERY       reports that she has never smoked. Her smokeless tobacco use includes Snuff. She reports that she does not drink alcohol or use drugs.  No Known Allergies  Family History  Problem Relation Age of Onset  . Aneurysm Mother     abdominal aortic  . Cancer Father     bone  . Cancer Sister     colon  . Diabetes Sister     Prior to Admission medications   Medication Sig Start Date End Date Taking? Authorizing Provider  ciprofloxacin (CIPRO) 500 MG tablet Take 500 mg by mouth 2 (two) times daily. 08/04/16  Yes [provider]  divalproex (DEPAKOTE SPRINKLE) 125 MG capsule TAKE ONE CAPSULE BY MOUTH TWICE A DAY FOR BEHAVIOR 05/03/15  Yes Reed, Tiffany L, DO  ipratropium-albuterol (DUONEB) 0.5-2.5 (3) MG/3ML SOLN Inhale 2.5 mLs into the lungs every 8 (eight) hours as needed for shortness of breath. 07/21/16  Yes [provider]  levothyroxine (SYNTHROID, LEVOTHROID) 100 MCG tablet TAKE 1 TABLET BY MOUTH FOR THYROID SUPPLEMENT 06/01/15  Yes Reed, Tiffany L, DO  lisinopril-hydrochlorothiazide (PRINZIDE,ZESTORETIC) 20-25 MG tablet TAKE 1 TABLET BY MOUTH EVERY DAY 03/22/15  Yes Reed, Tiffany L, DO  sertraline (ZOLOFT) 100 MG tablet TAKE 2 TABLETS BY MOUTH EVERY DAY Patient taking differently: take 1 tablet by mouth once daily 07/05/15  Yes Kermit Balo, DO    Physical Exam: Vitals:  08/06/16 1509 08/06/16 1633 08/06/16 1634 08/06/16 1700  BP: (!) 157/91  139/77 138/64  Pulse: (!) 103  99 99  Resp: 15  (!) 26 (!) 28  Temp:      TempSrc:      SpO2: 97%  94% 95%  Weight:  70.3 kg (155 lb)    Height:  5\' 5"  (1.651 m)       Constitutional: Sleeping comfortable Eyes: Pupil sluggish response miotic ENMT: Mucous membranes are dry  Neck: normal, supple, no masses, no thyromegaly Respiratory: Anterior auscultation good air entry no wheezing,  crackles or rales Cardiovascular: Regular rate and rhythm, no murmurs / rubs / gallops. No extremity edema. 2+ pedal pulses.   Abdomen: no tenderness, no masses palpated. No hepatosplenomegaly. Bowel sounds positive.  Musculoskeletal: Left upper extremity slight contracted, good muscle tone Skin: no rashes, lesions, ulcers. No induration Neurologic: Unable to examine given patient's severe dementia and lethargy Psychiatric: Unable to examine   Labs on Admission: I have personally reviewed following labs and imaging studies  CBC:  Recent Labs Lab 08/06/16 1442  WBC 13.5*  NEUTROABS 10.7*  HGB 14.4  HCT 43.3  MCV 89.3  PLT 274   Basic Metabolic Panel:  Recent Labs Lab 08/06/16 1442  NA 145  K 4.8  CL 109  CO2 22  GLUCOSE 701*  BUN 37*  CREATININE 1.74*  CALCIUM 9.6   GFR: Estimated Creatinine Clearance: 25.8 mL/min (A) (by C-G formula based on SCr of 1.74 mg/dL (H)). Liver Function Tests:  Recent Labs Lab 08/06/16 1442  AST 30  ALT 17  ALKPHOS 95  BILITOT 1.5*  PROT 8.3*  ALBUMIN 4.1   No results for input(s): LIPASE, AMYLASE in the last 168 hours. No results for input(s): AMMONIA in the last 168 hours. Coagulation Profile: No results for input(s): INR, PROTIME in the last 168 hours. Cardiac Enzymes: No results for input(s): CKTOTAL, CKMB, CKMBINDEX, TROPONINI in the last 168 hours. BNP (last 3 results) No results for input(s): PROBNP in the last 8760 hours. HbA1C: No results for input(s): HGBA1C in the last 72 hours. CBG:  Recent Labs Lab 08/06/16 1451  GLUCAP >600*   Lipid Profile: No results for input(s): CHOL, HDL, LDLCALC, TRIG, CHOLHDL, LDLDIRECT in the last 72 hours. Thyroid Function Tests: No results for input(s): TSH, T4TOTAL, FREET4, T3FREE, THYROIDAB in the last 72 hours. Anemia Panel: No results for input(s): VITAMINB12, FOLATE, FERRITIN, TIBC, IRON, RETICCTPCT in the last 72 hours. Urine analysis:    Component Value Date/Time    COLORURINE YELLOW 08/06/2016 1442   APPEARANCEUR CLEAR 08/06/2016 1442   LABSPEC 1.031 (H) 08/06/2016 1442   PHURINE 5.0 08/06/2016 1442   GLUCOSEU >=500 (A) 08/06/2016 1442   HGBUR SMALL (A) 08/06/2016 1442   BILIRUBINUR NEGATIVE 08/06/2016 1442   KETONESUR NEGATIVE 08/06/2016 1442   PROTEINUR NEGATIVE 08/06/2016 1442   NITRITE NEGATIVE 08/06/2016 1442   LEUKOCYTESUR NEGATIVE 08/06/2016 1442   Sepsis Labs: !!!!!!!!!!!!!!!!!!!!!!!!!!!!!!!!!!!!!!!!!!!! @LABRCNTIP (procalcitonin:4,lacticidven:4) )No results found for this or any previous visit (from the past 240 hour(s)).   Radiological Exams on Admission: Dg Chest 2 View  Result Date: 08/06/2016 CLINICAL DATA:  Altered mental status EXAM: CHEST  2 VIEW COMPARISON:  06/21/2015 FINDINGS: Low volume chest which accentuates heart size. Cardiomediastinal contours are stable. Interstitial crowding without edema or consolidation. Artifact from skin folds and EKG leads. Limited lateral view due to overlapping arms. IMPRESSION: Limited low volume chest without acute finding. Electronically Signed   By: Kathrynn DuckingJonathon  Watts M.D.  On: 08/06/2016 15:03   Ct Head Wo Contrast  Result Date: 08/06/2016 CLINICAL DATA:  Altered mental status.  Alzheimer's dementia EXAM: CT HEAD WITHOUT CONTRAST TECHNIQUE: Contiguous axial images were obtained from the base of the skull through the vertex without intravenous contrast. COMPARISON:  06/21/2015 FINDINGS: Brain: No evidence of acute infarction, hemorrhage, obstructive hydrocephalus, extra-axial collection or mass lesion/mass effect. Advanced atrophy with ventriculomegaly. Medial temporal atrophy is severe, correlating with Alzheimer's disease history. Cannot exclude a degree of communicating hydrocephalus given the difference between ventriculomegaly to sulcal widening. Confluent cerebral white matter disease. Vascular: Atherosclerosis.  No hyperdense vessel. Skull: No acute finding Sinuses/Orbits: No acute finding  IMPRESSION: 1. No acute finding. 2. Atrophy, ventriculomegaly, and extensive chronic small vessel ischemia. Electronically Signed   By: Marnee Spring M.D.   On: 08/06/2016 15:49   CXR: Images independently reviewed by myself: No acute abnormalities noted  Assessment/Plan Seizure like activity, unclear etiology at this time there's many confounding factors. She was admitted with hyperglycemia, possible UTI which was treated with Cipro prior to admission, dehydration, versus medication side effects. Admit to [facility], stepdown Seizures precaution, neurology was consulted recommending load with Keppra and Ativan when necessary. Obtaining EKG, EEG IV fluids Head CT with no abnormalities  Acute kidney injury - likely due to dehydration, elevated lactic acid and continuation of ACE and HCTZ IV fluids Check creatinine in the morning Holding ACE and HCTZ  Avoid nephrotoxic agents Repeat lactic acid  Hyperglycemia, no diagnosis of diabetes mellitus Anion gap 14, bicarbonate normal We'll place an insulin sliding scale. Expect to improve with IV fluids.  We'll obtain A1c Continue to monitor CBGs  Leukocytosis - likely related to early sepsis, I suspect this is due to the margination given hyperglycemia and dehydration. I don't see any specific signs for infection, chest x-ray looked clear, UA with no obvious sources, physical exam without abnormality suggesting source of infection. We'll continue IV fluids DC antibiotic Monitor patient off antibiotics. Follow up blood cultures, if spike fever recommend to restart antibiotics  Severe dementia - end-stage Alzheimer. Likely there is a component of the total deconditioning of her disease Per neurology recommendations Patient at risk of sundowning will add Haldol when necessary  Hypertension - slightly elevated BP meds on hold due to AKI and NPO  Will add hydralazine PRN, consider metoprolol if unable to control  Monitor BP   DVT prophylaxis:  Heparin subcutaneous Code Status: Full code Family Communication: Daughter at bedside Disposition Plan: Anticipate discharge to previous home environment.  Consults called: Neurology was called by EDP, patient may benefit from palliative care consult considering the morning Admission status: Inpatient/stepdown   Latrelle Dodrill MD [facility] Pager: Text Page via [email or web address]  [phone]  If 7PM-7AM, please contact night-coverage [email or web address] Password [facility]  08/06/2016, 5:38 PM

## 2016-08-06 NOTE — ED Triage Notes (Signed)
Per [facility] pt from home, lives with daughter. Called out by daughter stating pt having increased confusion with baseline of dementia. Also having foul urine odor. Called PCP Friday and placed pt on PO cipro. Daughter states no improvement.

## 2016-08-06 NOTE — ED Notes (Signed)
Patient transported to CT 

## 2016-08-06 NOTE — ED Notes (Signed)
Bed: WA01 Expected date:  Expected time:  Means of arrival:  Comments: 82 yo UTI?

## 2016-08-06 NOTE — ED Notes (Signed)
Pharmacy notified for Keppra.

## 2016-08-06 NOTE — ED Provider Notes (Signed)
[facility]-EMERGENCY DEPT Provider Note   CSN: [id number] Arrival date & time: 08/06/16  1401     History   Chief Complaint Chief Complaint  Patient presents with  . Altered Mental Status    HPI Candice Woodward is a 82 y.o. female.  HPI   Level V caveat due to dementia and altered mental status.  Candice Woodward is a 82 y.o. female, with a history of Alzheimer's dementia, HTN, and hypothyroidism, presenting to the ED with altered mental status for the last two days. Accompanied by her daughter and primary caregiver, [name].  Daughter states that the patient is usually minimally verbal due to her dementia at baseline, however, she will typically open her eyes on regular basis and respond with grunts or other sounds. These have been lessened over the past 2-3 days. Daughter also endorses foul-smelling urine over the past 2-3 days. Productive cough with clear white sputum for the past week. Patient's PCP called in a prescription for Cipro 2 days ago, with which the patient has been compliant. Also states the patient's left arm will go into spasms for a few minutes at a time.  Daughter denies known fever, falls/trauma, or any other abnormalities.    Past Medical History:  Diagnosis Date  . Acute bronchitis   . Alzheimer disease   . Alzheimer's disease   . Anxiety state, unspecified   . Dementia in conditions classified elsewhere with behavioral disturbance   . Disorder of bone and cartilage, unspecified   . Dizziness and giddiness   . Hypertension   . IBS (irritable bowel syndrome)   . Irritable bowel syndrome   . Lumbago   . Malnutrition of mild degree (HCC)   . Obsessive-compulsive disorders   . Other and unspecified hyperlipidemia   . Personal history of urinary (tract) infection   . Rickets, active   . Senile dementia, uncomplicated   . Thyroid disease     Patient Active Problem List   Diagnosis Date Noted  . Pressure ulcer of both heels, unstageable (HCC) 07/01/2015  .  Urinary and fecal incontinence 01/04/2015  . Essential hypertension 01/04/2015  . Gait instability 01/04/2015  . Poor vision 01/04/2015  . Depression 01/04/2015  . Obsessive compulsive disorder 01/04/2015  . Hypothyroidism 08/08/2013  . Alzheimer disease   . Irritable bowel syndrome   . Malnutrition of mild degree (HCC)   . Thyroid disease   . Hypertension     Past Surgical History:  Procedure Laterality Date  . PARTIAL HYSTERECTOMY    . REPLACEMENT TOTAL KNEE     bilateral  . THYROID SURGERY      OB History    No data available       Home Medications    Prior to Admission medications   Medication Sig Start Date End Date Taking? Authorizing Provider  ciprofloxacin (CIPRO) 500 MG tablet Take 500 mg by mouth 2 (two) times daily. 08/04/16  Yes [provider]  divalproex (DEPAKOTE SPRINKLE) 125 MG capsule TAKE ONE CAPSULE BY MOUTH TWICE A DAY FOR BEHAVIOR 05/03/15  Yes Reed, Tiffany L, DO  ipratropium-albuterol (DUONEB) 0.5-2.5 (3) MG/3ML SOLN Inhale 2.5 mLs into the lungs every 8 (eight) hours as needed for shortness of breath. 07/21/16  Yes [provider]  levothyroxine (SYNTHROID, LEVOTHROID) 100 MCG tablet TAKE 1 TABLET BY MOUTH FOR THYROID SUPPLEMENT 06/01/15  Yes Reed, Tiffany L, DO  lisinopril-hydrochlorothiazide (PRINZIDE,ZESTORETIC) 20-25 MG tablet TAKE 1 TABLET BY MOUTH EVERY DAY 03/22/15  Yes Reed, Tiffany L,  DO  sertraline (ZOLOFT) 100 MG tablet TAKE 2 TABLETS BY MOUTH EVERY DAY Patient taking differently: take 1 tablet by mouth once daily 07/05/15  Yes Reed, Tiffany L, DO    Family History Family History  Problem Relation Age of Onset  . Aneurysm Mother     abdominal aortic  . Cancer Father     bone  . Cancer Sister     colon  . Diabetes Sister     Social History Social History  Substance Use Topics  . Smoking status: Never Smoker  . Smokeless tobacco: Current User    Types: Snuff  . Alcohol use No     Allergies   Patient has no  known allergies.   Review of Systems Review of Systems  Unable to perform ROS: Dementia     Physical Exam Updated Vital Signs BP (!) 149/88 (BP Location: Left Arm)   Pulse 88   Temp 99.2 F (37.3 C) (Rectal)   Resp (!) 21   SpO2 100%   Physical Exam  Constitutional: She appears well-developed and well-nourished. She appears lethargic. No distress.  HENT:  Head: Normocephalic and atraumatic.  Mouth/Throat: Mucous membranes are dry.  Eyes: Conjunctivae are normal.  Neck: Neck supple.  Cardiovascular: Regular rhythm, normal heart sounds and intact distal pulses.  Tachycardia present.   Pulmonary/Chest: Effort normal and breath sounds normal. No respiratory distress.  Abdominal: Soft. There is no tenderness. There is no guarding.  Musculoskeletal: She exhibits no edema.  Lymphadenopathy:    She has no cervical adenopathy.  Neurological: She appears lethargic. She displays seizure activity. GCS eye subscore is 1. GCS verbal subscore is 2. GCS motor subscore is 4.  Decreased level of consciousness. Patient has no eye-opening to verbal or pain. Will not follow commands. Patient noted to have intermittent gross motor seizure-like activity to the left upper extremity.  Skin: Skin is warm and dry. She is not diaphoretic.  No active wounds noted on skin exam.  Nursing note and vitals reviewed.    ED Treatments / Results  Labs (all labs ordered are listed, but only abnormal results are displayed) Labs Reviewed  CBC WITH DIFFERENTIAL/PLATELET - Abnormal; Notable for the following:       Result Value   WBC 13.5 (*)    Neutro Abs 10.7 (*)    All other components within normal limits  COMPREHENSIVE METABOLIC PANEL - Abnormal; Notable for the following:    Glucose, Bld 701 (*)    BUN 37 (*)    Creatinine, Ser 1.74 (*)    Total Protein 8.3 (*)    Total Bilirubin 1.5 (*)    GFR calc non Af Amer 27 (*)    GFR calc Af Amer 31 (*)    All other components within normal limits    URINALYSIS, ROUTINE W REFLEX MICROSCOPIC - Abnormal; Notable for the following:    Specific Gravity, Urine 1.031 (*)    Glucose, UA >=500 (*)    Hgb urine dipstick SMALL (*)    All other components within normal limits  BLOOD GAS, VENOUS - Abnormal; Notable for the following:    pH, Ven 7.438 (*)    pCO2, Ven 34.9 (*)    pO2, Ven 57.5 (*)    All other components within normal limits  CBG MONITORING, ED - Abnormal; Notable for the following:    Glucose-Capillary >600 (*)    All other components within normal limits  I-STAT CG4 LACTIC ACID, ED - Abnormal; Notable for the  following:    Lactic Acid, Venous 3.60 (*)    All other components within normal limits  CULTURE, BLOOD (ROUTINE X 2)  CULTURE, BLOOD (ROUTINE X 2)  TSH  I-STAT TROPOININ, ED    EKG  EKG Interpretation  Date/Time:  Sunday Aug 06 2016 14:26:02 EDT Ventricular Rate:  97 PR Interval:    QRS Duration: 88 QT Interval:  370 QTC Calculation: 470 R Axis:   -9 Text Interpretation:  Sinus rhythm Borderline repolarization abnormality No STEMI.  Confirmed by LONG MD, JOSHUA [phone]) on 08/06/2016 3:37:58 PM       Radiology Dg Chest 2 View  Result Date: 08/06/2016 CLINICAL DATA:  Altered mental status EXAM: CHEST  2 VIEW COMPARISON:  06/21/2015 FINDINGS: Low volume chest which accentuates heart size. Cardiomediastinal contours are stable. Interstitial crowding without edema or consolidation. Artifact from skin folds and EKG leads. Limited lateral view due to overlapping arms. IMPRESSION: Limited low volume chest without acute finding. Electronically Signed   By: Marnee Spring M.D.   On: 08/06/2016 15:03   Ct Head Wo Contrast  Result Date: 08/06/2016 CLINICAL DATA:  Altered mental status.  Alzheimer's dementia EXAM: CT HEAD WITHOUT CONTRAST TECHNIQUE: Contiguous axial images were obtained from the base of the skull through the vertex without intravenous contrast. COMPARISON:  06/21/2015 FINDINGS: Brain: No evidence of  acute infarction, hemorrhage, obstructive hydrocephalus, extra-axial collection or mass lesion/mass effect. Advanced atrophy with ventriculomegaly. Medial temporal atrophy is severe, correlating with Alzheimer's disease history. Cannot exclude a degree of communicating hydrocephalus given the difference between ventriculomegaly to sulcal widening. Confluent cerebral white matter disease. Vascular: Atherosclerosis.  No hyperdense vessel. Skull: No acute finding Sinuses/Orbits: No acute finding IMPRESSION: 1. No acute finding. 2. Atrophy, ventriculomegaly, and extensive chronic small vessel ischemia. Electronically Signed   By: Marnee Spring M.D.   On: 08/06/2016 15:49    Procedures Procedures (including critical care time)  Medications Ordered in ED Medications  vancomycin (VANCOCIN) IVPB 750 mg/150 ml premix (not administered)  piperacillin-tazobactam (ZOSYN) IVPB 3.375 g (not administered)  sodium chloride 0.9 % bolus 1,000 mL (0 mLs Intravenous Stopped 08/06/16 1708)    And  sodium chloride 0.9 % bolus 1,000 mL (0 mLs Intravenous Stopped 08/06/16 1708)    And  sodium chloride 0.9 % bolus 250 mL (250 mLs Intravenous New Bag/Given 08/06/16 1635)  vancomycin (VANCOCIN) IVPB 1000 mg/200 mL premix (0 mg Intravenous Stopped 08/06/16 1704)  piperacillin-tazobactam (ZOSYN) IVPB 3.375 g (0 g Intravenous Stopped 08/06/16 1627)  LORazepam (ATIVAN) injection 2 mg (2 mg Intravenous Given 08/06/16 1627)  levETIRAcetam (KEPPRA) 1,500 mg in sodium chloride 0.9 % 100 mL IVPB (0 mg Intravenous Stopped 08/06/16 1643)     Initial Impression / Assessment and Plan / ED Course  I have reviewed the triage vital signs and the nursing notes.  Pertinent labs & imaging results that were available during my care of the patient were reviewed by me and considered in my medical decision making (see chart for details).  Clinical Course as of Aug 07 1707  Wynelle Link Aug 06, 2016  1603 Spoke with Dr. Roxy Manns, neurologist, who recommends 2  mg of Ativan and 1500 mg of Keppra. Patient should be transferred to [facility] and admitted via the hospitalist to at least stepdown.  [SJ]  1628 Spoke with Dr. Edward Jolly, hospitalist, who agreed to admit the patient to stepdown at [facility].    [SJ]    Clinical Course User Index [SJ] Anselm Pancoast, PA-C  Patient presents with change in mental status. Patient presentation suspicious for new onset seizure versus infectious source. [facility].    Vitals:   08/06/16 1413 08/06/16 1509 08/06/16 1633 08/06/16 1634  BP: (!) 149/88 (!) 157/91  139/77  Pulse: 88 (!) 103  99  Resp: (!) 21 15  (!) 26  Temp: 99.2 F (37.3 C)     TempSrc: Rectal     SpO2: 100% 97%  94%  Weight:   70.3 kg   Height:   5\' 5"  (1.651 m)      Final Clinical Impressions(s) / ED Diagnoses   Final diagnoses:  Somnolence  Hyperglycemia  Lactic acid acidosis  Seizure-like activity (HCC)    New Prescriptions New Prescriptions   No medications on file     Concepcion LivingJoy, Benjimen Kelley C, PA-C 08/06/16 1709    LongArlyss Repress, Joshua G, MD 08/07/16 1156

## 2016-08-06 NOTE — ED Notes (Signed)
Patient transported to X-ray 

## 2016-08-07 ENCOUNTER — Inpatient Hospital Stay (HOSPITAL_COMMUNITY): Payer: Medicare Other

## 2016-08-07 DIAGNOSIS — G9340 Encephalopathy, unspecified: Secondary | ICD-10-CM

## 2016-08-07 LAB — GLUCOSE, CAPILLARY
GLUCOSE-CAPILLARY: 140 mg/dL — AB (ref 65–99)
GLUCOSE-CAPILLARY: 143 mg/dL — AB (ref 65–99)
GLUCOSE-CAPILLARY: 211 mg/dL — AB (ref 65–99)
GLUCOSE-CAPILLARY: 300 mg/dL — AB (ref 65–99)
Glucose-Capillary: 406 mg/dL — ABNORMAL HIGH (ref 65–99)
Glucose-Capillary: 88 mg/dL (ref 65–99)

## 2016-08-07 LAB — COMPREHENSIVE METABOLIC PANEL
ALT: 15 U/L (ref 14–54)
ANION GAP: 9 (ref 5–15)
AST: 24 U/L (ref 15–41)
Albumin: 3.2 g/dL — ABNORMAL LOW (ref 3.5–5.0)
Alkaline Phosphatase: 75 U/L (ref 38–126)
BILIRUBIN TOTAL: 0.9 mg/dL (ref 0.3–1.2)
BUN: 26 mg/dL — ABNORMAL HIGH (ref 6–20)
CHLORIDE: 118 mmol/L — AB (ref 101–111)
CO2: 25 mmol/L (ref 22–32)
Calcium: 8.9 mg/dL (ref 8.9–10.3)
Creatinine, Ser: 1.37 mg/dL — ABNORMAL HIGH (ref 0.44–1.00)
GFR calc Af Amer: 41 mL/min — ABNORMAL LOW (ref >60)
GFR, EST NON AFRICAN AMERICAN: 36 mL/min — AB (ref >60)
Glucose, Bld: 326 mg/dL — ABNORMAL HIGH (ref 65–99)
POTASSIUM: 3.5 mmol/L (ref 3.5–5.1)
Sodium: 152 mmol/L — ABNORMAL HIGH (ref 135–145)
TOTAL PROTEIN: 6.9 g/dL (ref 6.5–8.1)

## 2016-08-07 LAB — BASIC METABOLIC PANEL
Anion gap: 9 (ref 5–15)
BUN: 31 mg/dL — ABNORMAL HIGH (ref 6–20)
CO2: 22 mmol/L (ref 22–32)
Calcium: 8.6 mg/dL — ABNORMAL LOW (ref 8.9–10.3)
Chloride: 114 mmol/L — ABNORMAL HIGH (ref 101–111)
Creatinine, Ser: 1.59 mg/dL — ABNORMAL HIGH (ref 0.44–1.00)
GFR calc Af Amer: 35 mL/min — ABNORMAL LOW (ref >60)
GFR calc non Af Amer: 30 mL/min — ABNORMAL LOW (ref >60)
Glucose, Bld: 589 mg/dL (ref 65–99)
Potassium: 3.9 mmol/L (ref 3.5–5.1)
Sodium: 145 mmol/L (ref 135–145)

## 2016-08-07 LAB — CBC
HEMATOCRIT: 39.6 % (ref 36.0–46.0)
Hemoglobin: 12.8 g/dL (ref 12.0–15.0)
MCH: 29.1 pg (ref 26.0–34.0)
MCHC: 32.3 g/dL (ref 30.0–36.0)
MCV: 90 fL (ref 78.0–100.0)
PLATELETS: 213 10*3/uL (ref 150–400)
RBC: 4.4 MIL/uL (ref 3.87–5.11)
RDW: 15.5 % (ref 11.5–15.5)
WBC: 11 10*3/uL — AB (ref 4.0–10.5)

## 2016-08-07 LAB — T4, FREE: FREE T4: 1.21 ng/dL — AB (ref 0.61–1.12)

## 2016-08-07 LAB — VITAMIN B12: VITAMIN B 12: 278 pg/mL (ref 180–914)

## 2016-08-07 LAB — VALPROIC ACID LEVEL: Valproic Acid Lvl: 40 ug/mL — ABNORMAL LOW (ref 50.0–100.0)

## 2016-08-07 LAB — TSH: TSH: 1.337 u[IU]/mL (ref 0.350–4.500)

## 2016-08-07 LAB — LACTIC ACID, PLASMA: LACTIC ACID, VENOUS: 2.3 mmol/L — AB (ref 0.5–1.9)

## 2016-08-07 MED ORDER — INSULIN GLARGINE 100 UNIT/ML ~~LOC~~ SOLN
15.0000 [IU] | Freq: Every day | SUBCUTANEOUS | Status: DC
  Administered 2016-08-07 – 2016-08-09 (×3): 15 [IU] via SUBCUTANEOUS
  Filled 2016-08-07 (×4): qty 0.15

## 2016-08-07 MED ORDER — INSULIN ASPART 100 UNIT/ML ~~LOC~~ SOLN
10.0000 [IU] | Freq: Once | SUBCUTANEOUS | Status: AC
  Administered 2016-08-07: 10 [IU] via SUBCUTANEOUS

## 2016-08-07 MED ORDER — SODIUM CHLORIDE 0.9 % IV SOLN
INTRAVENOUS | Status: DC
  Administered 2016-08-07 (×2): via INTRAVENOUS

## 2016-08-07 MED ORDER — INSULIN ASPART 100 UNIT/ML ~~LOC~~ SOLN
0.0000 [IU] | SUBCUTANEOUS | Status: DC
  Administered 2016-08-07: 5 [IU] via SUBCUTANEOUS
  Administered 2016-08-07 (×2): 2 [IU] via SUBCUTANEOUS
  Administered 2016-08-08 (×2): 3 [IU] via SUBCUTANEOUS

## 2016-08-07 MED ORDER — ORAL CARE MOUTH RINSE
15.0000 mL | Freq: Two times a day (BID) | OROMUCOSAL | Status: DC
  Administered 2016-08-07 – 2016-08-10 (×7): 15 mL via OROMUCOSAL

## 2016-08-07 MED ORDER — LIVING WELL WITH DIABETES BOOK
Freq: Once | Status: AC
  Administered 2016-08-07: 12:00:00
  Filled 2016-08-07: qty 1

## 2016-08-07 NOTE — Progress Notes (Signed)
EEG completed, results pending. 

## 2016-08-07 NOTE — Progress Notes (Signed)
CRITICAL VALUE ALERT  Critical value received:  Lactic acid  Date of notification:  08/07/16  Time of notification: 1058  Critical value read back:Yes.    Nurse who received alert:  Sabino DonovanMell Turrell Severt, RN  MD notified (1st page):  Dr Rito EhrlichKrishnan  Time of first page:  [phone]

## 2016-08-07 NOTE — Procedures (Signed)
HPI:  82 y/o with MS change  TECHNICAL SUMMARY:  A multichannel referential and bipolar montage EEG using the standard international 10-20 system was performed on the patient described as confused.  The dominant background activity consists of 6 hertz activity seen most prominantly over the posterior head region.  Much 4-5 Hz activity was seen intermixed in all head regions.    Low voltage fast (beta) activity is distributed symmetrically and maximally over the anterior head regions.  ACTIVATION:  Stepwise photic stimulation and hyperventilation were not performed.  EPILEPTIFORM ACTIVITY:  There were no spikes, sharp waves or paroxysmal activity.  SLEEP:  Stage I sleep was identified, but no stage II sleep architecture.  CARDIAC:  The EKG lead was not well recorded.  IMPRESSION:  This is an abnormal EEG demonstrating a moderate diffuse slowing of electrocerebral activity.  This can be seen in a wide variety of encephalopathic state including those of a toxic, metabolic, or degenerative nature.  There were no focal, hemispheric, or lateralizing features.  No epileptiform activity was recorded.

## 2016-08-07 NOTE — Progress Notes (Signed)
PT Cancellation Note/ Discharge  Patient Details Name: Candice Woodward MRN: [id number] DOB: 05/27/1934   Cancelled Treatment:    Reason Eval/Treat Not Completed: PT screened, no needs identified, will sign off (order received to start tomorrow however pt bedbound and total care at baseline without acute therapy needs. Will sign off.)   Candice Woodward 08/07/2016, 2:03 PM  Delaney MeigsMaija Tabor Lannie Yusuf, PT [phone]

## 2016-08-07 NOTE — Progress Notes (Signed)
Patient transported back to room on monitor, stable.

## 2016-08-07 NOTE — Progress Notes (Addendum)
Dr. Rito EhrlichKrishnan at bedside. Assessed patient. Notified that patient non-verbal. Unable to follow swallow cues to take pills. Also noticed patient has breathing pattern consistent with sleep apnea, with subsequent drop in sats. Sats decreases to mid 80's, then increases to upper 90's-100%.

## 2016-08-07 NOTE — Progress Notes (Signed)
OT Cancellation Note and Discharge  Patient Details Name: Candice ArtisLois B Durnell MRN: [id number] DOB: 10/14/1934   Cancelled Treatment:    Reason Eval/Treat Not Completed: OT screened, no needs identified, will sign off. Tried to call dtr (no answer at number in chart).Spoke to fiance of dtr (he was in room) and he reports that pt is bed bound and total care. Spoke to RN and she reports the info she got from dtr is that pt has been bed bound and total care since last summer.   Evette GeorgesLeonard, Kaianna Dolezal Eva [phone] 08/07/2016, 1:44 PM

## 2016-08-07 NOTE — Progress Notes (Signed)
SLP Cancellation Note  Patient Details Name: Candice Woodward MRN: [id number] DOB: 08/28/1934   Cancelled treatment:       Reason Eval/Treat Not Completed: Fatigue/lethargy limiting ability to participate per RN.   Ferdinand LangoLeah Sagan Wurzel MA, CCC-SLP [phone]  Ferdinand LangoMcCoy Murriel Eidem [name] 08/07/2016, 2:58 PM

## 2016-08-07 NOTE — Progress Notes (Signed)
Nutrition Education Note   RD consulted for nutrition education regarding diabetes.   HGBA1C pending  RD provided "Carbohydrate Counting for People with Diabetes" handout from the Academy of [employer] and Dietetics. Discussed different food groups and their effects on blood sugar, emphasizing carbohydrate-containing foods. Provided list of carbohydrates and recommended serving sizes of common foods.  Discussed importance of controlled and consistent carbohydrate intake throughout the day. Provided examples of ways to balance meals/snacks and encouraged intake of high-fiber, whole grain complex carbohydrates. Teach back method used.  Expect good compliance.  Body mass index is 25.79 kg/m. Pt meets criteria for overweight based on current BMI.  Current diet order is NPO.   RD following this patient   Betsey Holidayasey Lysette Lindenbaum, RD, LDN Pager #[phone]

## 2016-08-07 NOTE — Progress Notes (Signed)
Patient transported to radiology for MRI with RN on monitor.

## 2016-08-07 NOTE — Progress Notes (Signed)
Spoke with daughter of patient about the new diagnosis of diabetes.  States that patient has diabetes on both sides of the family. Daughter very willing to learn about taking care of her mother. Suggested that she give an insulin injection for practice with help of the staff RN and ask questions.  Staff RNs will teach daughter how to check blood sugars.  Recommend that daughter watch DM videos #501-510, insulin pen video is #511. Spoke with case manager to see what insulin that patient's insurance will cover and whether it is pens or vials. Recommend that daughter can get the [employer] Reli-on meter and strips, lancets for checking blood sugars. Will order a consult for a dietician to help daughter with meal planning. Patient will need a general home blood glucose meter prescription for meter, strips, and lancets at discharge. Will continue to monitor blood sugars while in the hospital.   Smith MinceKendra Karyss Frese RN BSN CDE Diabetes Coordinator Pager: [phone]  8am-5pm

## 2016-08-07 NOTE — Progress Notes (Signed)
#   [facility]. [facility] # [phone]    INSULINS:   1. LANTUS 100 UNITS  PENS  OR VIALS  COVER- YES-PENS                CO-PAY- $ 40.00  PRIOR APPROVAL- NO   VIALS- NOT COVER   2. NOVOLOG 100 UNITS PENS OR VIAL  COVER- YES- PENS  CO-PAY- $ 40.00  PRIOR APPROVAL-   VIALS- NONE FORMULARY  PREFERRED : HUMALOG   3 .HUMALOG 100 UNITS PENS OR VIAL  COVER- YES-PEN  CO-PAY- $ 40.00  PRIOR APPROVAL- NO   VIALS -COVER- YES  CO-PAY- $ 40.00  PRIOR APPROVAL- NO   PHARMACY : [facility]   Previous Messages

## 2016-08-07 NOTE — Progress Notes (Signed)
[facility] PROGRESS NOTE  SHAELEY SEGALL ZOX:[id number] DOB: 06-26-1934 DOA: 08/06/2016  PCP: Jiles Garter, FNP  Brief History/Interval Summary: 82 year old African-American female with a past medical history of severe Alzheimer's dementia who lives at home with her daughter, hypertension, hypothyroidism, was brought into the emergency department due to decreased level of consciousness and interaction. This occurred about 2 days prior to hospitalization. Patient was apparently treated with ciprofloxacin for UTI. There was also concern for involuntary upper extremity movement and jerking type movements. Concern was for seizures. She was hospitalized for further management. She was also found to have hyperglycemia which is new.  Reason for Visit: Possible seizures  Consultants: Neurology  Procedures: EEG is pending  Antibiotics: None  Patient was given vancomycin and Zosyn in the emergency department.  Subjective/Interval History: Patient not very responsive. Unable to provide any information.  ROS: Unable to do  Objective:  Vital Signs  Vitals:   08/07/16 0332 08/07/16 0700 08/07/16 0744 08/07/16 0800  BP: (!) 150/93  (!) 142/86 (!) 142/111  Pulse: 90 86 81 84  Resp: (!) 28 (!) 21 (!) 32 (!) 21  Temp: 98.2 F (36.8 C)  98.2 F (36.8 C)   TempSrc: Axillary  Oral   SpO2: 94%  97% 95%  Weight:      Height:        Intake/Output Summary (Last 24 hours) at 08/07/16 1041 Last data filed at 08/07/16 0800  Gross per 24 hour  Intake          3256.25 ml  Output                0 ml  Net          3256.25 ml   Filed Weights   08/06/16 1633  Weight: 70.3 kg (155 lb)    General appearance: Patient remains unresponsive. She is moaning and groaning, but does not open her eyes. Head: Normocephalic, without obvious abnormality, atraumatic Resp: Diminished air entry at the bases. No definite crackles or wheezing. Cardio: regular rate and rhythm, S1, S2 normal, no  murmur, click, rub or gallop GI: soft, non-tender; bowel sounds normal; no masses,  no organomegaly Extremities: extremities normal, atraumatic, no cyanosis or edema Neurologic: Patient is unresponsive. Moving all her extremities. Pupils are small equal.  Lab Results:  Data Reviewed: I have personally reviewed following labs and imaging studies  CBC:  Recent Labs Lab 08/06/16 1442 08/07/16 0932  WBC 13.5* 11.0*  NEUTROABS 10.7*  --   HGB 14.4 12.8  HCT 43.3 39.6  MCV 89.3 90.0  PLT 274 213    Basic Metabolic Panel:  Recent Labs Lab 08/06/16 1442 08/06/16 2259 08/07/16 0932  NA 145 145 152*  K 4.8 3.9 3.5  CL 109 114* 118*  CO2 22 22 25   GLUCOSE 701* 589* 326*  BUN 37* 31* 26*  CREATININE 1.74* 1.59* 1.37*  CALCIUM 9.6 8.6* 8.9    GFR: Estimated Creatinine Clearance: 32.7 mL/min (A) (by C-G formula based on SCr of 1.37 mg/dL (H)).  Liver Function Tests:  Recent Labs Lab 08/06/16 1442 08/07/16 0932  AST 30 24  ALT 17 15  ALKPHOS 95 75  BILITOT 1.5* 0.9  PROT 8.3* 6.9  ALBUMIN 4.1 3.2*    CBG:  Recent Labs Lab 08/06/16 1451 08/06/16 1807 08/06/16 2222 08/07/16 0214 08/07/16 0747  GLUCAP >600* 587* 544* 406* 300*     Recent Results (from the past 240 hour(s))  MRSA PCR Screening  Status: None   Collection Time: 08/06/16  7:26 PM  Result Value Ref Range Status   MRSA by PCR NEGATIVE NEGATIVE Final    Comment:        The GeneXpert MRSA Assay (FDA approved for NASAL specimens only), is one component of a comprehensive MRSA colonization surveillance program. It is not intended to diagnose MRSA infection nor to guide or monitor treatment for MRSA infections.       Radiology Studies: Dg Chest 2 View  Result Date: 08/06/2016 CLINICAL DATA:  Altered mental status EXAM: CHEST  2 VIEW COMPARISON:  06/21/2015 FINDINGS: Low volume chest which accentuates heart size. Cardiomediastinal contours are stable. Interstitial crowding without edema  or consolidation. Artifact from skin folds and EKG leads. Limited lateral view due to overlapping arms. IMPRESSION: Limited low volume chest without acute finding. Electronically Signed   By: Marnee Spring M.D.   On: 08/06/2016 15:03   Ct Head Wo Contrast  Result Date: 08/06/2016 CLINICAL DATA:  Altered mental status.  Alzheimer's dementia EXAM: CT HEAD WITHOUT CONTRAST TECHNIQUE: Contiguous axial images were obtained from the base of the skull through the vertex without intravenous contrast. COMPARISON:  06/21/2015 FINDINGS: Brain: No evidence of acute infarction, hemorrhage, obstructive hydrocephalus, extra-axial collection or mass lesion/mass effect. Advanced atrophy with ventriculomegaly. Medial temporal atrophy is severe, correlating with Alzheimer's disease history. Cannot exclude a degree of communicating hydrocephalus given the difference between ventriculomegaly to sulcal widening. Confluent cerebral white matter disease. Vascular: Atherosclerosis.  No hyperdense vessel. Skull: No acute finding Sinuses/Orbits: No acute finding IMPRESSION: 1. No acute finding. 2. Atrophy, ventriculomegaly, and extensive chronic small vessel ischemia. Electronically Signed   By: Marnee Spring M.D.   On: 08/06/2016 15:49     Medications:  Scheduled: . heparin  5,000 Units Subcutaneous Q8H  . insulin aspart  0-15 Units Subcutaneous Q4H  . insulin aspart  10 Units Subcutaneous Once  . insulin glargine  15 Units Subcutaneous Daily  . levothyroxine  100 mcg Oral QAC breakfast  . sodium chloride flush  3 mL Intravenous Q12H   Continuous: . sodium chloride 75 mL/hr at 08/07/16 0930  . valproate sodium     ZOX:[id number] **OR** acetaminophen, hydrALAZINE, ipratropium-albuterol, LORazepam, ondansetron **OR** ondansetron (ZOFRAN) IV  Assessment/Plan:  Active Problems:   Seizure (HCC)    Seizure-like activity with acute encephalopathy. Etiology for her presentation is not entirely clear. Neurology is  following. CT head did not show any acute findings. Patient remains poorly responsive. May be reasonable to attempt MRI. EEG is pending. She does have underlying dementia, which is likely contributing significantly. Patient has been started on Depakote by neurology. Check thyroid function tests. Check RPR, HIV, B-12. Hyperglycemia could also be contributing.  Acute kidney injury. Most likely due to dehydration. Patient was also on ACE inhibitor and hydrochlorothiazide at home, which has been held. Renal function has improved with hydration. Avoid nephrotoxic agents.  Lactic acidosis Possibly due to dehydration. No obvious source of infection identified. Lactic acid level improved to 2.3.  Hypernatremia Likely due to dehydration. Continue current IV fluids for now. Recheck labs tomorrow.  Hyperglycemia Patient does not have any history of diabetes. She appears to have new onset diabetes now.HbA1c is pending. Changed to every 4 hours CBG. Initiate Lantus.  Leukocytosis. Probably reactive due to acute illness. Patient is afebrile. UA did not show any infection. Chest x-ray did not show any infiltrates. Continue to monitor off of antibiotics. WBC has improved today. Follow-up on blood cultures  History of  severe dementia, Alzheimer's. Patient at risk for sundowning. However, due to the fact that she is unresponsive, avoid any sedative agents.  History of essential hypertension Monitor blood pressures closely. Oral blood pressure medications on hold for now. Hydralazine as needed.  DVT Prophylaxis: Subcutaneous heparin Code Status: Full code  Family Communication: Discussed with the patient's daughter  Disposition Plan: Management as outlined. We'll await further neurological testing. Leave nothing by mouth for now due to high risk for aspiration.    LOS: 1 day   [facility]avor Surgical CenterKRISHNAN,Aleksandra Raben  [facility] Pager [phone] 08/07/2016, 10:41 AM  If 7PM-7AM, please contact night-coverage at  [email or web address], password [email or web address]

## 2016-08-07 NOTE — Progress Notes (Addendum)
Dr. Rito EhrlichKrishnan at bedside. Discussed plan of care with patient's daughter at bedside.

## 2016-08-07 NOTE — Progress Notes (Addendum)
Initial Nutrition Assessment  DOCUMENTATION CODES:   Not applicable  INTERVENTION:   RD will order supplements when diet advanced. Pt requests Glucerna  NUTRITION DIAGNOSIS:   Inadequate oral intake related to inability to eat as evidenced by NPO status.  GOAL:   Patient will meet greater than or equal to 90% of their needs  MONITOR:   Diet advancement, Labs, Weight trends  REASON FOR ASSESSMENT:   Low Braden, Consult Diet education  ASSESSMENT:   82 year old African-American female with a past medical history of severe Alzheimer's dementia who lives at home with her daughter, hypertension, hypothyroidism, was brought into the emergency department due to decreased level of consciousness and interaction. Pt admitted for posible seizure. Noted to have new onset DM and AKI   Met with pt and pt's daughter in room today. Unable to communicate with pt so history obtained from pt's daughter. Daughter reports that pt is a good eater at home. Pt normally eats three meals and day and often eats double portions at all meals. Per daughter and chart, pt is weight stable. Daughter was provided diabetes education today as she prepares all meals for pt. Pt currently NPO. RD will order Glucerna when diet advanced. Consider swallow evaluation prior to diet initiation as daughter reports pt often coughs while eating.   Medications reviewed and include: heparin, insulin, synthroid  Labs reviewed: Na 152(H), Cl 118(H), BUN 26(H), creat 1.37(H), Alb 3.2(L) Wbc- 11.0(H) cbgs- 701, 589, 326 x 24hrs  Nutrition-Focused physical exam completed. Findings are no fat depletion, no muscle depletion, and mild generalized edema.   Diet Order:  Diet NPO time specified  Skin:  Reviewed, no issues  Last BM:  5/3  Height:   Ht Readings from Last 1 Encounters:  08/06/16 '5\' 5"'$  (1.651 m)    Weight:   Wt Readings from Last 1 Encounters:  08/06/16 155 lb (70.3 kg)    Ideal Body Weight:  56.8  kg  BMI:  Body mass index is 25.79 kg/m.  Estimated Nutritional Needs:   Kcal:  1400-1700kcal/day   Protein:  70-84g/day   Fluid:  >1.4L/day   EDUCATION NEEDS:   No education needs identified at this time  Koleen Distance, RD, LDN Pager #[phone]

## 2016-08-08 LAB — COMPREHENSIVE METABOLIC PANEL
ALBUMIN: 2.9 g/dL — AB (ref 3.5–5.0)
ALK PHOS: 62 U/L (ref 38–126)
ALT: 12 U/L — ABNORMAL LOW (ref 14–54)
AST: 22 U/L (ref 15–41)
Anion gap: 6 (ref 5–15)
BILIRUBIN TOTAL: 1 mg/dL (ref 0.3–1.2)
BUN: 21 mg/dL — AB (ref 6–20)
CALCIUM: 8.3 mg/dL — AB (ref 8.9–10.3)
CO2: 24 mmol/L (ref 22–32)
CREATININE: 1.09 mg/dL — AB (ref 0.44–1.00)
Chloride: 123 mmol/L — ABNORMAL HIGH (ref 101–111)
GFR calc Af Amer: 54 mL/min — ABNORMAL LOW (ref >60)
GFR calc non Af Amer: 47 mL/min — ABNORMAL LOW (ref >60)
GLUCOSE: 126 mg/dL — AB (ref 65–99)
Potassium: 3.4 mmol/L — ABNORMAL LOW (ref 3.5–5.1)
Sodium: 153 mmol/L — ABNORMAL HIGH (ref 135–145)
Total Protein: 6.2 g/dL — ABNORMAL LOW (ref 6.5–8.1)

## 2016-08-08 LAB — GLUCOSE, CAPILLARY
GLUCOSE-CAPILLARY: 165 mg/dL — AB (ref 65–99)
GLUCOSE-CAPILLARY: 174 mg/dL — AB (ref 65–99)
GLUCOSE-CAPILLARY: 184 mg/dL — AB (ref 65–99)
Glucose-Capillary: 116 mg/dL — ABNORMAL HIGH (ref 65–99)
Glucose-Capillary: 273 mg/dL — ABNORMAL HIGH (ref 65–99)

## 2016-08-08 LAB — CBC
HEMATOCRIT: 37 % (ref 36.0–46.0)
HEMOGLOBIN: 11.8 g/dL — AB (ref 12.0–15.0)
MCH: 28.7 pg (ref 26.0–34.0)
MCHC: 31.9 g/dL (ref 30.0–36.0)
MCV: 90 fL (ref 78.0–100.0)
Platelets: 203 10*3/uL (ref 150–400)
RBC: 4.11 MIL/uL (ref 3.87–5.11)
RDW: 15.1 % (ref 11.5–15.5)
WBC: 10.7 10*3/uL — ABNORMAL HIGH (ref 4.0–10.5)

## 2016-08-08 LAB — HEMOGLOBIN A1C
Hgb A1c MFr Bld: 10.4 % — ABNORMAL HIGH (ref 4.8–5.6)
MEAN PLASMA GLUCOSE: 252 mg/dL

## 2016-08-08 LAB — HIV ANTIBODY (ROUTINE TESTING W REFLEX): HIV Screen 4th Generation wRfx: NONREACTIVE

## 2016-08-08 LAB — RPR: RPR Ser Ql: NONREACTIVE

## 2016-08-08 MED ORDER — INSULIN ASPART 100 UNIT/ML ~~LOC~~ SOLN
0.0000 [IU] | Freq: Three times a day (TID) | SUBCUTANEOUS | Status: DC
  Administered 2016-08-08 – 2016-08-09 (×2): 3 [IU] via SUBCUTANEOUS
  Administered 2016-08-09: 5 [IU] via SUBCUTANEOUS
  Administered 2016-08-09: 3 [IU] via SUBCUTANEOUS
  Administered 2016-08-10: 5 [IU] via SUBCUTANEOUS
  Administered 2016-08-10: 3 [IU] via SUBCUTANEOUS

## 2016-08-08 MED ORDER — DEXTROSE-NACL 5-0.45 % IV SOLN
INTRAVENOUS | Status: DC
  Administered 2016-08-08: 13:00:00 via INTRAVENOUS

## 2016-08-08 MED ORDER — SODIUM CHLORIDE 0.45 % IV SOLN
INTRAVENOUS | Status: DC
  Administered 2016-08-08: 23:00:00 via INTRAVENOUS

## 2016-08-08 MED ORDER — DOCUSATE SODIUM 50 MG/5ML PO LIQD: 50.0000 mg | Freq: Every day | ORAL | Status: DC | PRN

## 2016-08-08 MED ORDER — POTASSIUM CHLORIDE 10 MEQ/100ML IV SOLN
10.0000 meq | INTRAVENOUS | Status: AC
  Administered 2016-08-08 (×2): 10 meq via INTRAVENOUS
  Filled 2016-08-08 (×4): qty 100

## 2016-08-08 MED ORDER — SERTRALINE HCL 100 MG PO TABS
200.0000 mg | ORAL_TABLET | Freq: Every day | ORAL | Status: DC
  Administered 2016-08-08 – 2016-08-10 (×3): 200 mg via ORAL
  Filled 2016-08-08 (×3): qty 2

## 2016-08-08 MED ORDER — INSULIN ASPART 100 UNIT/ML ~~LOC~~ SOLN
3.0000 [IU] | Freq: Once | SUBCUTANEOUS | Status: AC
  Administered 2016-08-08: 3 [IU] via SUBCUTANEOUS

## 2016-08-08 MED ORDER — DIVALPROEX SODIUM 125 MG PO CSDR
250.0000 mg | DELAYED_RELEASE_CAPSULE | Freq: Two times a day (BID) | ORAL | Status: DC
  Administered 2016-08-08: 250 mg via ORAL
  Administered 2016-08-09: 125 mg via ORAL
  Administered 2016-08-10: 250 mg via ORAL
  Filled 2016-08-08 (×4): qty 2

## 2016-08-08 MED ORDER — DIVALPROEX SODIUM 250 MG PO DR TAB
250.0000 mg | DELAYED_RELEASE_TABLET | Freq: Two times a day (BID) | ORAL | Status: DC
  Filled 2016-08-08: qty 1

## 2016-08-08 MED ORDER — LISINOPRIL-HYDROCHLOROTHIAZIDE 20-25 MG PO TABS: 1.0000 | ORAL_TABLET | Freq: Every day | ORAL | Status: DC

## 2016-08-08 MED ORDER — POTASSIUM CHLORIDE 10 MEQ/100ML IV SOLN
10.0000 meq | INTRAVENOUS | Status: AC
  Administered 2016-08-08 (×2): 10 meq via INTRAVENOUS

## 2016-08-08 MED ORDER — POLYETHYLENE GLYCOL 3350 17 G PO PACK
17.0000 g | PACK | Freq: Every day | ORAL | Status: DC
  Administered 2016-08-08 – 2016-08-10 (×3): 17 g via ORAL
  Filled 2016-08-08 (×3): qty 1

## 2016-08-08 NOTE — Progress Notes (Signed)
Subjective: No complaints and no changes overnight.  Exam: Vitals:   08/08/16 0341 08/08/16 0700  BP: (!) 152/82 (!) 157/77  Pulse: 87 87  Resp: (!) 23 (!) 27  Temp: 99.2 F (37.3 C)     HEENT-  Normocephalic, no lesions, without obvious abnormality.  Normal external eye and conjunctiva.  Normal TM's bilaterally.  Normal auditory canals and external ears. Normal external nose, mucus membranes and septum.  Normal pharynx. Cardiovascular- S1, S2 normal, pulses palpable throughout   Lungs- chest clear, no wheezing, rales, normal symmetric air entry, Heart exam - S1, S2 normal, no murmur, no gallop, rate regular Abdomen- soft, non-tender; bowel sounds normal; no masses,  no organomegaly Extremities- less then 2 second capillary refill Lymph-no adenopathy palpable Musculoskeletal-no joint tenderness, deformity or swelling Skin-warm and dry, no hyperpigmentation, vitiligo, or suspicious lesions   Neuro:  CN: Pupils are equal and round. She appears to blink to visual threat. Eyes are midposition, no forced deviation and no nystagmus. Corneals are intact. She grimaces to supraorbital pressure bilaterally. Her grimace is symmetric. The remainder of her cranial nerves cannot be assessed as she does not participate with the exam.  Motor: Normal bulk for age. She has mildly increased tone. She does not participate with confrontational testing. No tremor or other abnormal movements. No seizures.  Sensation: She moans and localizes noxious stimulation x4.       Pertinent Labs/Diagnostics: MRI-- FINDINGS: Brain: No acute stroke, acute hemorrhage, intracranial mass lesion, or extra-axial fluid. Extreme ventriculomegaly, somewhat out of proportion to the degree of cortical atrophy, could suggest a degree of normal pressure hydrocephalus, but given the extensive white matter disease, and intrinsic temporal lobe volume loss, ex vacuo enlargement of the ventricles is likely the primary  insult.  Vascular: Normal flow voids.  Skull and upper cervical spine: Normal marrow signal.  Sinuses/Orbits: Negative.  Other: None.  IMPRESSION: No acute intracranial findings.  Extreme ventriculomegaly, see discussion above.  Severe brain substance loss primarily affecting the temporal lobes correlating with Alzheimer's.  Extensive chronic microvascular ischemic change.  EEG: IMPRESSION:  This is an abnormal EEG demonstrating a moderate diffuse slowing of electrocerebral activity.  This can be seen in a wide variety of encephalopathic state including those of a toxic, metabolic, or degenerative nature.  There were no focal, hemispheric, or lateralizing features.  No epileptiform activity was recorded.    Felicie MornDavid Demitrious Mccannon PA-C [facility] [phone]  Impression: Most likely partial seizure activity. EEG does not show any further seizure activity after increasing Keppra to 250 twice a day. Most recent Depakote level was 40. As noted is likely that her current episode was provoked by the Cipro and possibly her extreme hyperglycemia. At this time she is had no further seizures. MRI of brain shows no acute stroke or mass. It does show enlarged ventricles although there is word of possible NPH given her age most likely ex vacuo and if this was NPH she is non-mobile and given her current state there would be no intervention for this at this time.  At this time neurology will sign off. If patient continues to have partial seizure activity would recommend patient be seen by outpatient neurologist.      08/08/2016, 10:30 AM

## 2016-08-08 NOTE — Clinical Social Work Note (Signed)
CSW met with patient's daughter at bedside to inquire about needs. [name] asked about someone coming into the home to help with bathing, feeding, etc. She is even interested in home hospice if that is what the patient needs. [name] interested in a palliative consult. [name] became tearful, stating that she just needed some help. CSW notified RNCM of interest in home health options and paged MD regarding palliative consult.   CSW will continue to follow progress in case further social work needs arise.  Candice Woodward, [place]

## 2016-08-08 NOTE — Progress Notes (Signed)
PROGRESS NOTE    Candice Woodward  JJO:[id number] DOB: 11/17/1934 DOA: 08/06/2016 PCP: Jiles GarterSheets, Angelique, FNP     Brief Narrative:  Candice Woodward is a 82 year old African-American female with a past medical history of severe Alzheimer's dementia who lives at home with her daughter, hypertension, hypothyroidism, was brought into the emergency department due to decreased level of consciousness and interaction. This occurred about 2 days prior to hospitalization. Patient was apparently treated with ciprofloxacin for UTI. There was also concern for involuntary upper extremity movement and jerking type movements. Concern was for seizures. She was hospitalized for further management. She was also found to have hyperglycemia which is new.  Assessment & Plan:   Active Problems:   Seizure (HCC)  Acute encephalopathy -Most likely secondary to partial seizure, possibly NPH  -Improving  Partial seizure -Neurology consulted, now signed off  -CT head did not show any acute findings -MRI with no acute intracranial findings, severe brain substance loss primarily affecting the temporal lobes correlating with Alzheimer's, extensive chronic microvascular ischemic change. -EEG moderate diffuse slowing of electrocerebral activity  -Likely that this episode was provoked by cipro, hyperglycemia. She should not take FQ from now on -Started on depakote   Acute kidney injury -Most likely due to dehydration. Patient was also on ACE inhibitor and hydrochlorothiazide at home, which has been held. Renal function has improved with hydration. Avoid nephrotoxic agents.  Hypernatremia -Start D5 1/2NS today -Trend BMP   Hyperglycemia -Patient does not have any history of diabetes. She appears to have new onset diabetes now.HbA1c 10.4. Lantus, novolog sliding scale.   Leukocytosis -Probably reactive due to acute illness. Patient is afebrile. UA did not show any infection. Chest x-ray did not show any infiltrates.  Blood cultures pending. Resolved   Lactic acidosis -Improved with hydration   Severe dementia, Alzheimer's -Minimize sedatives   Essential hypertension -Holding home meds due to AKI   Hypothyroidism -Continue synthroid   Hypokalemia -Replace   Constipation -Miralax, colace ordered   DVT prophylaxis: heparin subq Code Status: full Family Communication: daughter at bedside Disposition Plan: pending improvement, discharge anticipated home with daughter   Consultants:   Neurology  Procedures:   None  Antimicrobials:  Anti-infectives    Start     Dose/Rate Route Frequency Ordered Stop   08/07/16 1600  vancomycin (VANCOCIN) IVPB 750 mg/150 ml premix  Status:  Discontinued     750 mg 150 mL/hr over 60 Minutes Intravenous Every 24 hours 08/06/16 1642 08/06/16 1712   08/07/16 0000  piperacillin-tazobactam (ZOSYN) IVPB 3.375 g  Status:  Discontinued     3.375 g 12.5 mL/hr over 240 Minutes Intravenous Every 8 hours 08/06/16 1642 08/06/16 1712   08/06/16 1545  vancomycin (VANCOCIN) IVPB 1000 mg/200 mL premix     1,000 mg 200 mL/hr over 60 Minutes Intravenous  Once 08/06/16 1536 08/06/16 1704   08/06/16 1545  piperacillin-tazobactam (ZOSYN) IVPB 3.375 g     3.375 g 100 mL/hr over 30 Minutes Intravenous  Once 08/06/16 1536 08/06/16 1627        Subjective: Patient babbles incoherently, which is baseline per daughter. More alert than she has been.  Objective: Vitals:   08/07/16 2331 08/08/16 0341 08/08/16 0700 08/08/16 1237  BP: (!) 167/85 (!) 152/82 (!) 157/77 (!) 149/94  Pulse: 83 87 87 80  Resp: (!) 22 (!) 23 (!) 27 12  Temp: 98.1 F (36.7 C) 99.2 F (37.3 C) 98.4 F (36.9 C) 99.2 F (37.3 C)  TempSrc: Axillary Axillary  Axillary Oral  SpO2: 98% 97% 98% 100%  Weight:      Height:        Intake/Output Summary (Last 24 hours) at 08/08/16 1305 Last data filed at 08/08/16 0600  Gross per 24 hour  Intake           1642.5 ml  Output                0 ml    Net           1642.5 ml   Filed Weights   08/06/16 1633  Weight: 70.3 kg (155 lb)    Examination:  General exam: Appears calm and comfortable  Respiratory system: Clear to auscultation. Respiratory effort normal. Cardiovascular system: S1 & S2 heard, RRR. No JVD, murmurs, rubs, gallops or clicks. No pedal edema. Gastrointestinal system: Abdomen is nondistended, soft and nontender. Normal bowel sounds heard. Central nervous system: Alert, does not follow commands, babbles incoherently  Extremities: Symmetric Skin: No rashes, lesions or ulcers on exposed skin   Data Reviewed: I have personally reviewed following labs and imaging studies  CBC:  Recent Labs Lab 08/06/16 1442 08/07/16 0932 08/08/16 0332  WBC 13.5* 11.0* 10.7*  NEUTROABS 10.7*  --   --   HGB 14.4 12.8 11.8*  HCT 43.3 39.6 37.0  MCV 89.3 90.0 90.0  PLT 274 213 203   Basic Metabolic Panel:  Recent Labs Lab 08/06/16 1442 08/06/16 2259 08/07/16 0932 08/08/16 0332  NA 145 145 152* 153*  K 4.8 3.9 3.5 3.4*  CL 109 114* 118* 123*  CO2 22 22 25 24   GLUCOSE 701* 589* 326* 126*  BUN 37* 31* 26* 21*  CREATININE 1.74* 1.59* 1.37* 1.09*  CALCIUM 9.6 8.6* 8.9 8.3*   GFR: Estimated Creatinine Clearance: 41.2 mL/min (A) (by C-G formula based on SCr of 1.09 mg/dL (H)). Liver Function Tests:  Recent Labs Lab 08/06/16 1442 08/07/16 0932 08/08/16 0332  AST 30 24 22   ALT 17 15 12*  ALKPHOS 95 75 62  BILITOT 1.5* 0.9 1.0  PROT 8.3* 6.9 6.2*  ALBUMIN 4.1 3.2* 2.9*   No results for input(s): LIPASE, AMYLASE in the last 168 hours. No results for input(s): AMMONIA in the last 168 hours. Coagulation Profile: No results for input(s): INR, PROTIME in the last 168 hours. Cardiac Enzymes: No results for input(s): CKTOTAL, CKMB, CKMBINDEX, TROPONINI in the last 168 hours. BNP (last 3 results) No results for input(s): PROBNP in the last 8760 hours. HbA1C:  Recent Labs  08/07/16 0934  HGBA1C 10.4*    CBG:  Recent Labs Lab 08/07/16 2109 08/07/16 2344 08/08/16 0341 08/08/16 0750 08/08/16 1235  GLUCAP 143* 140* 116* 165* 174*   Lipid Profile: No results for input(s): CHOL, HDL, LDLCALC, TRIG, CHOLHDL, LDLDIRECT in the last 72 hours. Thyroid Function Tests:  Recent Labs  08/07/16 0932 08/07/16 0934  TSH  --  1.337  FREET4 1.21*  --    Anemia Panel:  Recent Labs  08/07/16 0932  VITAMINB12 278   Sepsis Labs:  Recent Labs Lab 08/06/16 1517 08/07/16 0932  LATICACIDVEN 3.60* 2.3*    Recent Results (from the past 240 hour(s))  Blood Culture (routine x 2)     Status: None (Preliminary result)   Collection Time: 08/06/16  3:18 PM  Result Value Ref Range Status   Specimen Description BLOOD RIGHT HAND  Final   Special Requests IN PEDIATRIC BOTTLE Blood Culture adequate volume  Final   Culture   Final  NO GROWTH < 24 HOURS Performed at [facility], [address] [place], [place] [address]    Report Status PENDING  Incomplete  Blood Culture (routine x 2)     Status: None (Preliminary result)   Collection Time: 08/06/16  4:00 PM  Result Value Ref Range Status   Specimen Description BLOOD BLOOD RIGHT FOREARM  Final   Special Requests   Final    BOTTLES DRAWN AEROBIC AND ANAEROBIC Blood Culture adequate volume   Culture   Final    NO GROWTH < 24 HOURS Performed at [facility], [address] [place], [place] [address]    Report Status PENDING  Incomplete  MRSA PCR Screening     Status: None   Collection Time: 08/06/16  7:26 PM  Result Value Ref Range Status   MRSA by PCR NEGATIVE NEGATIVE Final    Comment:        The GeneXpert MRSA Assay (FDA approved for NASAL specimens only), is one component of a comprehensive MRSA colonization surveillance program. It is not intended to diagnose MRSA infection nor to guide or monitor treatment for MRSA infections.        Radiology Studies: Dg Chest 2 View  Result Date: 08/06/2016 CLINICAL  DATA:  Altered mental status EXAM: CHEST  2 VIEW COMPARISON:  06/21/2015 FINDINGS: Low volume chest which accentuates heart size. Cardiomediastinal contours are stable. Interstitial crowding without edema or consolidation. Artifact from skin folds and EKG leads. Limited lateral view due to overlapping arms. IMPRESSION: Limited low volume chest without acute finding. Electronically Signed   By: Marnee Spring M.D.   On: 08/06/2016 15:03   Ct Head Wo Contrast  Result Date: 08/06/2016 CLINICAL DATA:  Altered mental status.  Alzheimer's dementia EXAM: CT HEAD WITHOUT CONTRAST TECHNIQUE: Contiguous axial images were obtained from the base of the skull through the vertex without intravenous contrast. COMPARISON:  06/21/2015 FINDINGS: Brain: No evidence of acute infarction, hemorrhage, obstructive hydrocephalus, extra-axial collection or mass lesion/mass effect. Advanced atrophy with ventriculomegaly. Medial temporal atrophy is severe, correlating with Alzheimer's disease history. Cannot exclude a degree of communicating hydrocephalus given the difference between ventriculomegaly to sulcal widening. Confluent cerebral white matter disease. Vascular: Atherosclerosis.  No hyperdense vessel. Skull: No acute finding Sinuses/Orbits: No acute finding IMPRESSION: 1. No acute finding. 2. Atrophy, ventriculomegaly, and extensive chronic small vessel ischemia. Electronically Signed   By: Marnee Spring M.D.   On: 08/06/2016 15:49   Mr Brain Wo Contrast  Result Date: 08/07/2016 CLINICAL DATA:  Severe Alzheimer's disease. Decreased level of consciousness. EXAM: MRI HEAD WITHOUT CONTRAST TECHNIQUE: Multiplanar, multiecho pulse sequences of the brain and surrounding structures were obtained without intravenous contrast. COMPARISON:  CT head 08/06/2016 FINDINGS: Brain: No acute stroke, acute hemorrhage, intracranial mass lesion, or extra-axial fluid. Extreme ventriculomegaly, somewhat out of proportion to the degree of cortical  atrophy, could suggest a degree of normal pressure hydrocephalus, but given the extensive white matter disease, and intrinsic temporal lobe volume loss, ex vacuo enlargement of the ventricles is likely the primary insult. Vascular: Normal flow voids. Skull and upper cervical spine: Normal marrow signal. Sinuses/Orbits: Negative. Other: None. IMPRESSION: No acute intracranial findings. Extreme ventriculomegaly, see discussion above. Severe brain substance loss primarily affecting the temporal lobes correlating with Alzheimer's. Extensive chronic microvascular ischemic change. Electronically Signed   By: Elsie Stain M.D.   On: 08/07/2016 19:10      Scheduled Meds: . heparin  5,000 Units Subcutaneous Q8H  . insulin aspart  0-15 Units Subcutaneous Q4H  . insulin aspart  10 Units Subcutaneous Once  . insulin glargine  15 Units Subcutaneous Daily  . levothyroxine  100 mcg Oral QAC breakfast  . mouth rinse  15 mL Mouth Rinse BID  . polyethylene glycol  17 g Oral Daily  . sodium chloride flush  3 mL Intravenous Q12H   Continuous Infusions: . dextrose 5 % and 0.45% NaCl 75 mL/hr at 08/08/16 1303  . potassium chloride    . valproate sodium Stopped (08/07/16 2223)     LOS: 2 days    Time spent: 40 minutes   Noralee Stain, DO [facility] [email or web address] Password [email or web address] 08/08/2016, 1:05 PM

## 2016-08-08 NOTE — Progress Notes (Signed)
Daughter agitated that lab was manuvering pt's contracted arm and states that lab yelled at her mother. Nurse across from pt's room and did not witness lab yelling. Daughter is very tired and decides to go home to shower. Daughter is assured that pt will be monitored.

## 2016-08-08 NOTE — Evaluation (Signed)
Clinical/Bedside Swallow Evaluation Patient Details  Name: Candice Woodward MRN: [id number] Date of Birth: 11-Sep-1934  Today's Date: 08/08/2016 Time: SLP Start Time (ACUTE ONLY): 0920 SLP Stop Time (ACUTE ONLY): 0947 SLP Time Calculation (min) (ACUTE ONLY): 27 min  Past Medical History:  Past Medical History:  Diagnosis Date  . Acute bronchitis   . Alzheimer disease   . Alzheimer's disease   . Anxiety state, unspecified   . Dementia in conditions classified elsewhere with behavioral disturbance   . Disorder of bone and cartilage, unspecified   . Dizziness and giddiness   . Hypertension   . IBS (irritable bowel syndrome)   . Irritable bowel syndrome   . Lumbago   . Malnutrition of mild degree (HCC)   . Obsessive-compulsive disorders   . Other and unspecified hyperlipidemia   . Personal history of urinary (tract) infection   . Rickets, active   . Senile dementia, uncomplicated   . Thyroid disease    Past Surgical History:  Past Surgical History:  Procedure Laterality Date  . PARTIAL HYSTERECTOMY    . REPLACEMENT TOTAL KNEE     bilateral  . THYROID SURGERY     HPI:  82 year old African-American female with a past medical history of severe Alzheimer's dementia who lives at home with her daughter, hypertension, hypothyroidism, was brought into the emergency department due to decreased level of consciousness and interaction. This occurred about 2 days prior to hospitalization. Patient was apparently treated with ciprofloxacin for UTI. There was also concern for involuntary upper extremity movement and jerking type movements.  No cause yet identified for decline in mental status. Pts daughter reprots pt typically consumed a mechanical soft/fine chopped diet but sometimes pockets/holds foods in her mouth.    Assessment / Plan / Recommendation Clinical Impression  Pt demonstrates a moderate oral dysphagia consistent with dementia, though worsened from baseline function. Pt is able to  tolerate thin liquids wihtout signs of aspiration, though tactile cues and careful hand feeding are needed to facilitate pts awareness of PO. SLP insructed daughter in these methods, though she has been successfuly utilizing many of these independently prior to admit. Pts ability to attend to mastication and oral transit of solids is decreased from baseline and risk of oral holding/pocketing is increased. Discussed at length with daughter and she is agreeable to initaiting a puree diet for now and following for tolerance and potential upgrade if attention improves.  SLP Visit Diagnosis: Dysphagia, oral phase (R13.11)    Aspiration Risk  Mild aspiration risk    Diet Recommendation Dysphagia 1 (Puree);Thin liquid   Liquid Administration via: Cup;Straw Medication Administration: Whole meds with liquid Supervision: Staff to assist with self feeding;Full supervision/cueing for compensatory strategies Compensations: Slow rate;Small sips/bites;Minimize environmental distractions Postural Changes: Seated upright at 90 degrees;Remain upright for at least 30 minutes after po intake    Other  Recommendations Oral Care Recommendations: Oral care BID   Follow up Recommendations 24 hour supervision/assistance      Frequency and Duration min 2x/week  2 weeks       Prognosis Prognosis for Safe Diet Advancement: Fair Barriers to Reach Goals: Cognitive deficits      Swallow Study   General HPI: 82 year old African-American female with a past medical history of severe Alzheimer's dementia who lives at home with her daughter, hypertension, hypothyroidism, was brought into the emergency department due to decreased level of consciousness and interaction. This occurred about 2 days prior to hospitalization. Patient was apparently treated with ciprofloxacin for UTI.  There was also concern for involuntary upper extremity movement and jerking type movements.  No cause yet identified for decline in mental status.  Pts daughter reprots pt typically consumed a mechanical soft/fine chopped diet but sometimes pockets/holds foods in her mouth.  Type of Study: Bedside Swallow Evaluation Previous Swallow Assessment: none Diet Prior to this Study: NPO Temperature Spikes Noted: No Respiratory Status: Room air History of Recent Intubation: No Behavior/Cognition: Alert;Cooperative;Distractible;Requires cueing Oral Care Completed by SLP: No Oral Cavity - Dentition: Edentulous Self-Feeding Abilities: Total assist Patient Positioning: Upright in bed Baseline Vocal Quality: Normal Volitional Cough: Cognitively unable to elicit Volitional Swallow: Unable to elicit    Oral/Motor/Sensory Function Overall Oral Motor/Sensory Function: Within functional limits (does not follow commands, but strong)   Ice Chips Ice chips: Impaired Presentation: Spoon Oral Phase Functional Implications: Prolonged oral transit;Oral holding Pharyngeal Phase Impairments: Suspected delayed Swallow   Thin Liquid Thin Liquid: Impaired Presentation: Straw;Cup Oral Phase Impairments: Poor awareness of bolus Pharyngeal  Phase Impairments: Suspected delayed Swallow    Nectar Thick Nectar Thick Liquid: Not tested   Honey Thick Honey Thick Liquid: Not tested   Puree Puree: Impaired Presentation: Spoon Oral Phase Impairments: Poor awareness of bolus Oral Phase Functional Implications: Prolonged oral transit Pharyngeal Phase Impairments: Suspected delayed Swallow   Solid   GO   Solid: Impaired Presentation: Spoon Oral Phase Impairments: Impaired mastication;Poor awareness of bolus Oral Phase Functional Implications: Oral holding;Prolonged oral transit       Harlon DittyBonnie Jashon Ishida, MA CCC-SLP [phone]  Claudine MoutonDeBlois, Leyan Branden Caroline 08/08/2016,11:21 AM

## 2016-08-09 DIAGNOSIS — Z515 Encounter for palliative care: Secondary | ICD-10-CM

## 2016-08-09 DIAGNOSIS — R638 Other symptoms and signs concerning food and fluid intake: Secondary | ICD-10-CM | POA: Diagnosis present

## 2016-08-09 DIAGNOSIS — F0280 Dementia in other diseases classified elsewhere without behavioral disturbance: Secondary | ICD-10-CM

## 2016-08-09 DIAGNOSIS — G309 Alzheimer's disease, unspecified: Secondary | ICD-10-CM

## 2016-08-09 DIAGNOSIS — Z7189 Other specified counseling: Secondary | ICD-10-CM

## 2016-08-09 LAB — BASIC METABOLIC PANEL
Anion gap: 9 (ref 5–15)
BUN: 16 mg/dL (ref 6–20)
CALCIUM: 8.1 mg/dL — AB (ref 8.9–10.3)
CHLORIDE: 115 mmol/L — AB (ref 101–111)
CO2: 22 mmol/L (ref 22–32)
CREATININE: 0.94 mg/dL (ref 0.44–1.00)
GFR calc Af Amer: >60 mL/min (ref >60)
GFR calc non Af Amer: 56 mL/min — ABNORMAL LOW (ref >60)
GLUCOSE: 180 mg/dL — AB (ref 65–99)
Potassium: 3.3 mmol/L — ABNORMAL LOW (ref 3.5–5.1)
Sodium: 146 mmol/L — ABNORMAL HIGH (ref 135–145)

## 2016-08-09 LAB — GLUCOSE, CAPILLARY
GLUCOSE-CAPILLARY: 191 mg/dL — AB (ref 65–99)
GLUCOSE-CAPILLARY: 186 mg/dL — AB (ref 65–99)
GLUCOSE-CAPILLARY: 186 mg/dL — AB (ref 65–99)
Glucose-Capillary: 223 mg/dL — ABNORMAL HIGH (ref 65–99)

## 2016-08-09 LAB — MAGNESIUM: MAGNESIUM: 2.1 mg/dL (ref 1.7–2.4)

## 2016-08-09 MED ORDER — INSULIN ASPART 100 UNIT/ML ~~LOC~~ SOLN
3.0000 [IU] | Freq: Three times a day (TID) | SUBCUTANEOUS | Status: DC
  Administered 2016-08-09 – 2016-08-10 (×3): 3 [IU] via SUBCUTANEOUS

## 2016-08-09 MED ORDER — POTASSIUM CHLORIDE 20 MEQ PO PACK
40.0000 meq | PACK | Freq: Once | ORAL | Status: AC
  Administered 2016-08-09: 40 meq via ORAL
  Filled 2016-08-09: qty 2

## 2016-08-09 NOTE — Progress Notes (Signed)
  Speech Language Pathology Treatment: Dysphagia  Patient Details Name: Candice Woodward MRN: [id number] DOB: 11/22/1934 Today's Date: 08/09/2016 Time: [id number] SLP Time Calculation (min) (ACUTE ONLY): 33 min  Assessment / Plan / Recommendation Clinical Impression  SLP visited pt and daughter during am meal. Pt more lethargic today, required increased cueing from daughter. Reinforced strategies and aspiration precautions. Addressed daughters concerns about need for MBS. Pt currently demonstrates no signs of aspiration with puree and thin liquids and no dx of pna. She would be unlikely to participate in MBS given mentation and lethargy and it would likely only cause distress. As long as pt is tolerating current textures, encouraged daughter to focus on precautions and intake to facilitate hydration and nutrition. Daughter expressed worry that her mother felt hungry and thirsty, but couldn't express it to her. We discussed progression of dementia with decreased intake and decreased sensation of hunger and thirst. I also presented concerns about feeding tubes with dementia We talked about reading her physical cues for feeding. Will f/u to address further needs.   HPI HPI: 82 year old African-American female with a past medical history of severe Alzheimer's dementia who lives at home with her daughter, hypertension, hypothyroidism, was brought into the emergency department due to decreased level of consciousness and interaction. This occurred about 2 days prior to hospitalization. Patient was apparently treated with ciprofloxacin for UTI. There was also concern for involuntary upper extremity movement and jerking type movements.  No cause yet identified for decline in mental status. Pts daughter reprots pt typically consumed a mechanical soft/fine chopped diet but sometimes pockets/holds foods in her mouth.       SLP Plan  Continue with current plan of care       Recommendations  Diet recommendations:  Dysphagia 1 (puree);Thin liquid Liquids provided via: Cup;Straw Medication Administration: Whole meds with puree Supervision: Trained caregiver to feed patient;Full supervision/cueing for compensatory strategies;Staff to assist with self feeding Compensations: Slow rate;Small sips/bites;Minimize environmental distractions Postural Changes and/or Swallow Maneuvers: Seated upright 90 degrees;Upright 30-60 min after meal                Oral Care Recommendations: Oral care BID Follow up Recommendations: 24 hour supervision/assistance SLP Visit Diagnosis: Dysphagia, oral phase (R13.11) Plan: Continue with current plan of care       GO               Vidant Bertie HospitalBonnie Mckyle Solanki, MA CCC-SLP [phone]  Candice Woodward, Candice Woodward 08/09/2016, 10:15 AM

## 2016-08-09 NOTE — Progress Notes (Signed)
PROGRESS NOTE    Candice ArtisLois B Mcgreal  RUE:[id number] DOB: 05/13/1934 DOA: 08/06/2016 PCP: Jiles GarterSheets, Angelique, FNP     Brief Narrative:  Candice Woodward is a 82 year old African-American female with a past medical history of severe Alzheimer's dementia who lives at home with her daughter, hypertension, hypothyroidism, was brought into the emergency department due to decreased level of consciousness and interaction. This occurred about 2 days prior to hospitalization. Patient was apparently treated with ciprofloxacin for UTI. There was also concern for involuntary upper extremity movement and jerking type movements. Concern was for seizures. She was hospitalized for further management. She was also found to have hyperglycemia which is new.  Assessment & Plan:   Active Problems:   Seizure (HCC)  Acute encephalopathy -Most likely secondary to partial seizure, possibly NPH  -Improving  Partial seizure -Neurology consulted, now signed off  -CT head did not show any acute findings -MRI with no acute intracranial findings, severe brain substance loss primarily affecting the temporal lobes correlating with Alzheimer's, extensive chronic microvascular ischemic change. -EEG moderate diffuse slowing of electrocerebral activity  -Likely that this episode was provoked by cipro, hyperglycemia. She should not take FQ from now on -Started on depakote   Acute kidney injury -Most likely due to dehydration. Patient was also on ACE inhibitor and hydrochlorothiazide at home, which has been held. Renal function has improved with hydration. Avoid nephrotoxic agents. Will have to stop ACE inhibitor as patient's daughter states that she had an episode of angioedema in recent history, unclear if from ACE or not   Hypernatremia -Improving with D5 1/2NS  -Trend BMP   Hyperglycemia -Patient does not have any history of diabetes. She appears to have new onset diabetes now.HbA1c 10.4. Lantus, novolog sliding scale.    Leukocytosis -Probably reactive due to acute illness. Patient is afebrile. UA did not show any infection. Chest x-ray did not show any infiltrates. Blood cultures negative to date. Resolved   Lactic acidosis -Improved with hydration   Severe dementia, Alzheimer's -Minimize sedatives   Essential hypertension -Holding home meds due to AKI. Can resume HCTZ on discharge   Hypothyroidism -Continue synthroid   Hypokalemia -Replace, trend   Constipation -Miralax, colace ordered   DVT prophylaxis: heparin subq Code Status: full Family Communication: daughter over the phone today  Disposition Plan: pending improvement, discharge anticipated home with daughter with home hospice    Consultants:   Neurology  Palliative care  Procedures:   None  Antimicrobials:  Anti-infectives    Start     Dose/Rate Route Frequency Ordered Stop   08/07/16 1600  vancomycin (VANCOCIN) IVPB 750 mg/150 ml premix  Status:  Discontinued     750 mg 150 mL/hr over 60 Minutes Intravenous Every 24 hours 08/06/16 1642 08/06/16 1712   08/07/16 0000  piperacillin-tazobactam (ZOSYN) IVPB 3.375 g  Status:  Discontinued     3.375 g 12.5 mL/hr over 240 Minutes Intravenous Every 8 hours 08/06/16 1642 08/06/16 1712   08/06/16 1545  vancomycin (VANCOCIN) IVPB 1000 mg/200 mL premix     1,000 mg 200 mL/hr over 60 Minutes Intravenous  Once 08/06/16 1536 08/06/16 1704   08/06/16 1545  piperacillin-tazobactam (ZOSYN) IVPB 3.375 g     3.375 g 100 mL/hr over 30 Minutes Intravenous  Once 08/06/16 1536 08/06/16 1627       Subjective: Patient babbles incoherently, laughing, which is baseline per daughter. More alert than she has been.  Objective: Vitals:   08/08/16 2302 08/09/16 0400 08/09/16 0740 08/09/16 1320  BP: (!) 165/84 116/60 126/63 140/77  Pulse: 72 83 76 76  Resp: 20 (!) 24 17 (!) 22  Temp: 99.4 F (37.4 C) 97.9 F (36.6 C) 97.9 F (36.6 C) 98.1 F (36.7 C)  TempSrc: Axillary Axillary  Axillary Axillary  SpO2: 97% 98% 99% 99%  Weight:      Height:        Intake/Output Summary (Last 24 hours) at 08/09/16 1404 Last data filed at 08/09/16 0400  Gross per 24 hour  Intake              930 ml  Output                0 ml  Net              930 ml   Filed Weights   08/06/16 1633  Weight: 70.3 kg (155 lb)    Examination:  General exam: Appears calm and comfortable  Respiratory system: Clear to auscultation. Respiratory effort normal. Cardiovascular system: S1 & S2 heard, RRR. No JVD, murmurs, rubs, gallops or clicks. No pedal edema. Gastrointestinal system: Abdomen is nondistended, soft and nontender. Normal bowel sounds heard. Central nervous system: Alert, does not follow commands, babbles incoherently  Extremities: Symmetric Skin: No rashes, lesions or ulcers on exposed skin   Data Reviewed: I have personally reviewed following labs and imaging studies  CBC:  Recent Labs Lab 08/06/16 1442 08/07/16 0932 08/08/16 0332  WBC 13.5* 11.0* 10.7*  NEUTROABS 10.7*  --   --   HGB 14.4 12.8 11.8*  HCT 43.3 39.6 37.0  MCV 89.3 90.0 90.0  PLT 274 213 203   Basic Metabolic Panel:  Recent Labs Lab 08/06/16 1442 08/06/16 2259 08/07/16 0932 08/08/16 0332 08/09/16 0158 08/09/16 0500  NA 145 145 152* 153* 146*  --   K 4.8 3.9 3.5 3.4* 3.3*  --   CL 109 114* 118* 123* 115*  --   CO2 22 22 25 24 22   --   GLUCOSE 701* 589* 326* 126* 180*  --   BUN 37* 31* 26* 21* 16  --   CREATININE 1.74* 1.59* 1.37* 1.09* 0.94  --   CALCIUM 9.6 8.6* 8.9 8.3* 8.1*  --   MG  --   --   --   --   --  2.1   GFR: Estimated Creatinine Clearance: 47.7 mL/min (by C-G formula based on SCr of 0.94 mg/dL). Liver Function Tests:  Recent Labs Lab 08/06/16 1442 08/07/16 0932 08/08/16 0332  AST 30 24 22   ALT 17 15 12*  ALKPHOS 95 75 62  BILITOT 1.5* 0.9 1.0  PROT 8.3* 6.9 6.2*  ALBUMIN 4.1 3.2* 2.9*   No results for input(s): LIPASE, AMYLASE in the last 168 hours. No results for  input(s): AMMONIA in the last 168 hours. Coagulation Profile: No results for input(s): INR, PROTIME in the last 168 hours. Cardiac Enzymes: No results for input(s): CKTOTAL, CKMB, CKMBINDEX, TROPONINI in the last 168 hours. BNP (last 3 results) No results for input(s): PROBNP in the last 8760 hours. HbA1C:  Recent Labs  08/07/16 0934  HGBA1C 10.4*   CBG:  Recent Labs Lab 08/08/16 1235 08/08/16 1703 08/08/16 2108 08/09/16 0741 08/09/16 1319  GLUCAP 174* 184* 273* 191* 186*   Lipid Profile: No results for input(s): CHOL, HDL, LDLCALC, TRIG, CHOLHDL, LDLDIRECT in the last 72 hours. Thyroid Function Tests:  Recent Labs  08/07/16 0932 08/07/16 0934  TSH  --  1.337  FREET4 1.21*  --  Anemia Panel:  Recent Labs  08/07/16 0932  VITAMINB12 278   Sepsis Labs:  Recent Labs Lab 08/06/16 1517 08/07/16 0932  LATICACIDVEN 3.60* 2.3*    Recent Results (from the past 240 hour(s))  Blood Culture (routine x 2)     Status: None (Preliminary result)   Collection Time: 08/06/16  3:18 PM  Result Value Ref Range Status   Specimen Description BLOOD RIGHT HAND  Final   Special Requests IN PEDIATRIC BOTTLE Blood Culture adequate volume  Final   Culture   Final    NO GROWTH 3 DAYS Performed at [facility], [address] [place], [place] [address]    Report Status PENDING  Incomplete  Blood Culture (routine x 2)     Status: None (Preliminary result)   Collection Time: 08/06/16  4:00 PM  Result Value Ref Range Status   Specimen Description BLOOD BLOOD RIGHT FOREARM  Final   Special Requests   Final    BOTTLES DRAWN AEROBIC AND ANAEROBIC Blood Culture adequate volume   Culture   Final    NO GROWTH 3 DAYS Performed at [facility], [address], [place], [place] [address]    Report Status PENDING  Incomplete  MRSA PCR Screening     Status: None   Collection Time: 08/06/16  7:26 PM  Result Value Ref Range Status   MRSA by PCR NEGATIVE NEGATIVE Final     Comment:        The GeneXpert MRSA Assay (FDA approved for NASAL specimens only), is one component of a comprehensive MRSA colonization surveillance program. It is not intended to diagnose MRSA infection nor to guide or monitor treatment for MRSA infections.        Radiology Studies: Mr Brain Wo Contrast  Result Date: 08/07/2016 CLINICAL DATA:  Severe Alzheimer's disease. Decreased level of consciousness. EXAM: MRI HEAD WITHOUT CONTRAST TECHNIQUE: Multiplanar, multiecho pulse sequences of the brain and surrounding structures were obtained without intravenous contrast. COMPARISON:  CT head 08/06/2016 FINDINGS: Brain: No acute stroke, acute hemorrhage, intracranial mass lesion, or extra-axial fluid. Extreme ventriculomegaly, somewhat out of proportion to the degree of cortical atrophy, could suggest a degree of normal pressure hydrocephalus, but given the extensive white matter disease, and intrinsic temporal lobe volume loss, ex vacuo enlargement of the ventricles is likely the primary insult. Vascular: Normal flow voids. Skull and upper cervical spine: Normal marrow signal. Sinuses/Orbits: Negative. Other: None. IMPRESSION: No acute intracranial findings. Extreme ventriculomegaly, see discussion above. Severe brain substance loss primarily affecting the temporal lobes correlating with Alzheimer's. Extensive chronic microvascular ischemic change. Electronically Signed   By: Elsie Stain M.D.   On: 08/07/2016 19:10      Scheduled Meds: . divalproex  250 mg Oral Q12H  . heparin  5,000 Units Subcutaneous Q8H  . insulin aspart  0-15 Units Subcutaneous TID WC  . insulin aspart  3 Units Subcutaneous TID WC  . insulin glargine  15 Units Subcutaneous Daily  . levothyroxine  100 mcg Oral QAC breakfast  . mouth rinse  15 mL Mouth Rinse BID  . polyethylene glycol  17 g Oral Daily  . sertraline  200 mg Oral Daily  . sodium chloride flush  3 mL Intravenous Q12H   Continuous Infusions: . sodium  chloride 75 mL/hr at 08/08/16 2251     LOS: 3 days    Time spent: 40 minutes   Noralee Stain, DO [facility] [email or web address] Password [email or web address] 08/09/2016, 2:04 PM

## 2016-08-09 NOTE — Progress Notes (Signed)
[facility] Hospital Liaison:  RN visit  Notified by Letha Capeeborah Taylor, CMRN, of patient/family request [facility] after discharge.  Chart and [facility].  Hospice eligibility pending at this time.   Writer spoke with Angelique Blonderenise, daughter, at bedside to initiate education related to hospice philosophy, services and team approach to care.  Daughter verbalized understanding of information given.  Per discussion, plan is for discharge to home by PTAR on 08/10/16.  Please send signed and completed DNR form home with patient/family.  Patient will need prescriptions for discharge comfort medications.   DME needs have been discussed, patient currently has the following equipment in the home:  Hospital bed and wheelchair.  Daughter requests only diabetic supplies at this time.  Sent email to Research scientist (physical sciences)equipment manager, Loistine SimasJewel Hughes, to order, if we are able to.  Hospital liaison to follow up with equipment manager in the morning to see if this is a covered supply offered by Hospice.  We will also follow up with Gavin Poundeborah, [facility], in the morning.    [facility]erral [facility] is aware of the above.  Completed discharge summary will need to be [facility]ed [facility] at [phone], when final.  Please notify [facility] when patient is ready to leave the unit at discharge.  Call [phone] or [phone] after 5 pm.  [facility] information and contact numbers have been given to [facility] during visit.  Above information will be shared with Letha Capeeborah Taylor, CMRN, in the morning after eligibility is confirmed.  Please call with any hospice related questions.  Thank you for the referral.  Adele BarthelAmy Evans, RN, BSN [facility] Hospital Liaison [phone]  All hospital liaisons are now on AMION.

## 2016-08-09 NOTE — Progress Notes (Signed)
Pt HS CBG 273, NP [place] on call notified, orders given.  Pt currently ACHS.  Will continue to monitor.

## 2016-08-09 NOTE — Consult Note (Signed)
Consultation Note Date: 08/09/2016   Patient Name: Candice Woodward  DOB: 01-16-1935  MRN: [id number]  Age / Sex: 82 y.o., female  PCP: [name], [place] Referring Physician: Baird Lyons*  Reason for Consultation: Establishing goals of care, Hospice Evaluation and Psychosocial/spiritual support  HPI/Patient Profile: 82 y.o. female  with past medical history of severe Alzheimer's dementia who lives at home with her daughter, hypertension, hypothyroidism admitted from home with her daughter on 08/06/2016 with decreased level of consciousness/interaction and jerking type movements. Patient with advanced dementia and has had ongoing lethargy (worse than her baseline) with decreased intake. Palliative care consulted for support with worsening dementia and functional status.   Clinical Assessment and Goals of Care: I met today with Ms. Hoar and her daughter, Candice Woodward, at bedside. Ms. Kobel is lethargic but will occasionally open her eyes, smile, laugh and would less often attempt to talk but unable to understand. Candice Woodward is very concerned with her mother's continued lethargy and decreased intake.   I spoke more with [place] privately. Candice Woodward is tearful but says that she knows that her mother is approaching the end of her life with dementia and understands that swallowing and intake is one of the last stages for most people with dementia. Candice Woodward struggles as her mother has lived with her since 2007 and [name]'s life has been consumed by caring for her mother and her 53 yo son. She is very anxious about missing a sign of worsening health that may be prevented for her mother. She worries about her son dealing with losing his grandmother and his grieving experience as he internalizes. Educated on hospice and hospice bereavement services. Candice Woodward has decided on DNR and to have hospice services at home (had hospice for  her father).   Candice Woodward is concerned with decisions and says that she had POA but this was done in [place] and she does not have a copy. Candice Woodward has a brother that is out of state and has not been involved in his mother's care. Discussed guardianship but Candice Woodward says that it makes her sad to think of having her mother declared "incompetent." However, Ms. Bonifield clearly does not have any capacity to make any decisions and cannot even communicate at this time.   Primary Decision Maker NEXT OF KIN daughter Candice Woodward    SUMMARY OF RECOMMENDATIONS   - DNR - Home with hospice  Code Status/Advance Care Planning:  DNR   Symptom Management:   No discomfort noted.  Decreased intake: SLP following. Careful and slow hand feeding.   Palliative Prophylaxis:   Aspiration, Bowel Regimen, Delirium Protocol, Frequent Pain Assessment, Oral Care and Turn Reposition   Psycho-social/Spiritual:   Desire for further Chaplaincy support:yes  Additional Recommendations: Caregiving  Support/Resources, Education on Hospice and Grief/Bereavement Support  Prognosis:   < 6 months  Discharge Planning: Home with Hospice      Primary Diagnoses: Present on Admission: **None**   I have reviewed the medical record, interviewed the patient and family, and examined the patient. The following aspects  are pertinent.  Past Medical History:  Diagnosis Date  . Acute bronchitis   . Alzheimer disease   . Alzheimer's disease   . Anxiety state, unspecified   . Dementia in conditions classified elsewhere with behavioral disturbance   . Disorder of bone and cartilage, unspecified   . Dizziness and giddiness   . Hypertension   . IBS (irritable bowel syndrome)   . Irritable bowel syndrome   . Lumbago   . Malnutrition of mild degree ([place])   . Obsessive-compulsive disorders   . Other and unspecified hyperlipidemia   . Personal history of urinary (tract) infection   . Rickets, active   . Senile dementia,  uncomplicated   . Thyroid disease    Social History   Social History  . Marital status: Widowed    Spouse name: N/A  . Number of children: N/A  . Years of education: N/A   Social History Main Topics  . Smoking status: Never Smoker  . Smokeless tobacco: Current User    Types: Snuff  . Alcohol use No  . Drug use: No  . Sexual activity: Not Asked   Other Topics Concern  . None   Social History Narrative  . None   Family History  Problem Relation Age of Onset  . Aneurysm Mother     abdominal aortic  . Cancer Father     bone  . Cancer Sister     colon  . Diabetes Sister    Scheduled Meds: . divalproex  250 mg Oral Q12H  . heparin  5,000 Units Subcutaneous Q8H  . insulin aspart  0-15 Units Subcutaneous TID WC  . insulin aspart  3 Units Subcutaneous TID WC  . insulin glargine  15 Units Subcutaneous Daily  . levothyroxine  100 mcg Oral QAC breakfast  . mouth rinse  15 mL Mouth Rinse BID  . polyethylene glycol  17 g Oral Daily  . sertraline  200 mg Oral Daily  . sodium chloride flush  3 mL Intravenous Q12H   Continuous Infusions: . sodium chloride 75 mL/hr at 08/08/16 2251   PRN Meds:.acetaminophen **OR** acetaminophen, docusate, hydrALAZINE, ipratropium-albuterol, LORazepam, ondansetron **OR** ondansetron (ZOFRAN) IV No Known Allergies Review of Systems  Unable to perform ROS: Dementia    Physical Exam  Constitutional: She appears well-developed. She appears lethargic. She appears ill.  HENT:  Head: Normocephalic and atraumatic.  Cardiovascular: Normal rate and regular rhythm.   Pulmonary/Chest: Effort normal. No accessory muscle usage. No tachypnea. No respiratory distress.  Abdominal: Soft. Normal appearance.  Neurological: She appears lethargic. She is disoriented.  Nursing note and vitals reviewed.   Vital Signs: BP 126/63 (BP Location: Left Arm)   Pulse 76   Temp 97.9 F (36.6 C) (Axillary)   Resp 17   Ht _0  (1.651 m)   Wt 70.3 kg (155 lb)    SpO2 99%   BMI 25.79 kg/m  Pain Assessment: PAINAD       SpO2: SpO2: 99 % O2 Device:SpO2: 99 % O2 Flow Rate: .   IO: Intake/output summary:  Intake/Output Summary (Last 24 hours) at 08/09/16 1231 Last data filed at 08/09/16 0400  Gross per 24 hour  Intake              930 ml  Output                0 ml  Net              930 ml  LBM: Last BM Date: 08/08/16 Baseline Weight: Weight: 70.3 kg (155 lb) Most recent weight: Weight: 70.3 kg (155 lb)     Palliative Assessment/Data: PPS: 20%     Time In: 1000 Time Out: 1220 Time Total: 15mn Greater than 50%  of this time was spent counseling and coordinating care related to the above assessment and plan.  Signed by: AVinie Sill NP Palliative Medicine Team Pager # [phone](M-F 8a-5p) Team Phone # [phone](Nights/Weekends)

## 2016-08-09 NOTE — Care Management Note (Addendum)
Case Management Note  Patient Details  Name: Candice Woodward MRN: [id number] Date of Birth: 01/03/1935  Subjective/Objective:  From home with daughter, Candice Woodward [phone], presents with partial seizure , acute encephalopathy,  Acute kidney injury, hypernatremia, hypergycemia, leukocytosis, lactic acidosis, svere dementia, alzheimers, htn, hypothyroidism, hypokalemia, constipation. NCM received referral for home with hospice,  Offered choice to daughter , Candice Woodward, she chose [facility], referral made to Amy.  Patient has semi-automatic hospital bed at home with w/chair. Will need PTAR transport at discharge.  Address confirmed with daughter, Candice Woodward will need to be called for transport, plan for dc 5/10.               Action/Plan: NCM will follow for dc needs.  Expected Discharge Date:  08/08/16               Expected Discharge Plan:  Home w Hospice Care  In-House Referral:     Discharge planning Services  CM Consult  Post Acute Care Choice:    Choice offered to:     DME Arranged:    DME Agency:     HH Arranged:    HH Agency:     Status of Service:  Completed, signed off  If discussed at [employer] Meetings, dates discussed:    Additional Comments:  Leone Havenaylor, Patrina Andreas Clinton, RN 08/09/2016, 4:29 PM

## 2016-08-10 LAB — BASIC METABOLIC PANEL
Anion gap: 9 (ref 5–15)
BUN: 16 mg/dL (ref 6–20)
CHLORIDE: 113 mmol/L — AB (ref 101–111)
CO2: 18 mmol/L — ABNORMAL LOW (ref 22–32)
Calcium: 7.8 mg/dL — ABNORMAL LOW (ref 8.9–10.3)
Creatinine, Ser: 0.94 mg/dL (ref 0.44–1.00)
GFR calc Af Amer: >60 mL/min (ref >60)
GFR, EST NON AFRICAN AMERICAN: 56 mL/min — AB (ref >60)
Glucose, Bld: 223 mg/dL — ABNORMAL HIGH (ref 65–99)
POTASSIUM: 3.9 mmol/L (ref 3.5–5.1)
SODIUM: 140 mmol/L (ref 135–145)

## 2016-08-10 LAB — GLUCOSE, CAPILLARY
GLUCOSE-CAPILLARY: 207 mg/dL — AB (ref 65–99)
Glucose-Capillary: 188 mg/dL — ABNORMAL HIGH (ref 65–99)

## 2016-08-10 LAB — MAGNESIUM: Magnesium: 2 mg/dL (ref 1.7–2.4)

## 2016-08-10 MED ORDER — INSULIN PEN NEEDLE 31G X 5 MM MISC: 0 refills | Status: AC

## 2016-08-10 MED ORDER — SERTRALINE HCL 100 MG PO TABS: 100.0000 mg | ORAL_TABLET | Freq: Every day | ORAL | 0 refills | Status: DC

## 2016-08-10 MED ORDER — INSULIN ASPART 100 UNIT/ML FLEXPEN: 3.0000 [IU] | PEN_INJECTOR | Freq: Three times a day (TID) | SUBCUTANEOUS | 0 refills | Status: DC

## 2016-08-10 MED ORDER — HYDROCHLOROTHIAZIDE 25 MG PO TABS: 25.0000 mg | ORAL_TABLET | Freq: Every day | ORAL | 0 refills | Status: DC

## 2016-08-10 MED ORDER — BLOOD GLUCOSE METER KIT: PACK | 0 refills | Status: DC

## 2016-08-10 MED ORDER — DIVALPROEX SODIUM 125 MG PO CSDR: 250.0000 mg | DELAYED_RELEASE_CAPSULE | Freq: Two times a day (BID) | ORAL | 0 refills | Status: DC

## 2016-08-10 MED ORDER — INSULIN GLARGINE 100 UNIT/ML SOLOSTAR PEN: 20.0000 [IU] | PEN_INJECTOR | Freq: Every day | SUBCUTANEOUS | 0 refills | Status: DC

## 2016-08-10 MED ORDER — INSULIN LISPRO 100 UNIT/ML (KWIKPEN): 3.0000 [IU] | PEN_INJECTOR | Freq: Three times a day (TID) | SUBCUTANEOUS | 0 refills | Status: AC

## 2016-08-10 MED ORDER — INSULIN GLARGINE 100 UNIT/ML ~~LOC~~ SOLN: 20.0000 [IU] | Freq: Every day | SUBCUTANEOUS | Status: DC

## 2016-08-10 NOTE — Progress Notes (Addendum)
[facility]: SW note  Hospice eligibility confirmed by hospice physician. [facility] has AV RN scheduled to see patient today between 5:30 and 7:30 if patient is discharged today. Spoke with daughter briefly this morning while she was feeding patient. Will follow up again. Spoke with RNCM Stanton KidneyDebra to make her aware patient will need scripts for insulin and syringes. [facility] will provide lancets, alcohol wipes, testing meter and strips at time of AV visit.   During this visit, confirmed with daughter she already has DME needed at this time and plan is for patient to transport via PTAR.   Please fax discharge summary to [phone]. Please contact [facility] when patient is ready to leave unit at [phone] before 5 pm and [phone] after 5 pm.  Please send scripts for any medication patient does not already have.   Thank you, Forrestine Himva Davis, LCSW [phone]

## 2016-08-10 NOTE — Progress Notes (Signed)
Daughter given discharge instructions over the phone and verbalized understanding.PTAR arrived to pick up patient.

## 2016-08-10 NOTE — Discharge Summary (Signed)
Physician Discharge Summary  Candice Woodward SHF:[id number] DOB: [birth year] DOA: 08/06/2016  PCP: [name], FNP  Admit date: 08/06/2016 Discharge date: 08/10/2016  Admitted From: Home Disposition:  Home with Home Hospice  Recommendations for Outpatient Follow-up:  1. Follow up with PCP in 1 week 2. Follow up with Neurology in 2-4 weeks  3. Please obtain CBC/BMP in 1 week   Home Health: Hospice  Equipment/Devices: None   Discharge Condition: Stable CODE STATUS: DNR  Diet recommendation: dysphagia 1   Brief/Interim Summary: From H&P by Dr. Quincy Simmonds: Candice Woodward is a 82 y.o. female with medical history significant of severe Alzheimer, hypertension, hypothyroidism presented to the emergency department after daughter noticing patient being less interactive than normal. Per daughter patient has decreased her interaction since 2 days ago where she stopped eating. Last night she noticed some abnormal upper extremity movement/jerking. Patient was treated with Cipro for presumed UTI for 2 days. Daughter also reported URI symptoms with nasal congestion and cough that has been going on for couple of months now. Daughter denies that patient had fever, shortness of breath, chest pain, nausea or vomiting.  ED Course: Patient was noted to be dehydrated, with jerking movement in both upper extremities. EDP consulted neurologist recommended loading with Keppra and Ativan when necessary and transferred to [facility]. Creatinine elevated 1.74, lactic acid 3.6, WBC of 13.5, hyperglycemia with glucose of 701 no new no anion gap and  bicarbonate is normal  Interim: Patient was evaluated by neurologist. She was determined to have a partial seizure activity and was switched to Depakote. She did not have any more seizure episodes while in hospital. During her hospitalization, she was found to have hyperglycemia with elevated A1c with new diagnosis of diabetes. Encephalopathy improved, although not quite at  baseline. Acute kidney injury improved with IV fluids. Palliative care was also consulted and after discussion with daughter, patient was enrolled in hospice on discharge.   Discharge Diagnoses:  Active Problems:   Seizure ([place])   Decreased oral intake   Goals of care, counseling/discussion   Palliative care encounter  Acute encephalopathy -Most likely secondary to partial seizure, possibly NPH  -Improving  Partial seizure -Neurology consulted, now signed off  -CT head did not show any acute findings -MRI with no acute intracranial findings, severe brain substance loss primarily affecting the temporal lobes correlating with Alzheimer's, extensive chronic microvascular ischemic change. -EEG moderate diffuse slowing of electrocerebral activity  -Likely that this episode was provoked by cipro, hyperglycemia. She should not take FQ from now on -Started on depakote   Acute kidney injury -Most likely due to dehydration. Patient was also on ACE inhibitor and hydrochlorothiazide at home, which has been held. Renal function has improved with hydration. Avoid nephrotoxic agents. Will have to stop ACE inhibitor as patient's daughter states that she had an episode of angioedema in recent history, unclear if from ACE or not  -Resume HCTZ on discharge, discontinue ACE   Hypernatremia -Resolved   Hyperglycemia -Patient does not have any history of diabetes. She appears to have new onset diabetes now.HbA1c 10.4. Lantus, novolog sliding at discharge.   Leukocytosis -Probably reactive due to acute illness. Patient is afebrile. UA did not show any infection. Chest x-ray did not show any infiltrates. Blood cultures negative to date. Resolved   Lactic acidosis -Improved with hydration   Severe dementia, Alzheimer's -Minimize sedatives   Essential hypertension -Holding home meds due to AKI. Can resume HCTZ on discharge   Hypothyroidism -Continue synthroid  Hypokalemia -Replace,  trend   Constipation -Miralax, colace    Discharge Instructions  Discharge Instructions    Discharge instructions    Complete by:  As directed    You were cared for by a hospitalist during your hospital stay. If you have any questions about your discharge medications or the care you received while you were in the hospital after you are discharged, you can call the unit and asked to speak with the hospitalist on call if the hospitalist that took care of you is not available. Once you are discharged, your primary care physician will handle any further medical issues. Please note that NO REFILLS for any discharge medications will be authorized once you are discharged, as it is imperative that you return to your primary care physician (or establish a relationship with a primary care physician if you do not have one) for your aftercare needs so that they can reassess your need for medications and monitor your lab values.     Allergies as of 08/10/2016      Reactions   Lisinopril Swelling      Medication List    STOP taking these medications   lisinopril-hydrochlorothiazide 20-25 MG tablet Commonly known as:  PRINZIDE,ZESTORETIC     TAKE these medications   blood glucose meter kit and supplies Dispense based on patient and insurance preference. Use up to four times daily as directed. (FOR ICD-9 250.00, 250.01).   divalproex 125 MG capsule Commonly known as:  DEPAKOTE SPRINKLE Take 2 capsules (250 mg total) by mouth every 12 (twelve) hours. What changed:  See the new instructions.   hydrochlorothiazide 25 MG tablet Commonly known as:  HYDRODIURIL Take 1 tablet (25 mg total) by mouth daily.   Insulin Glargine 100 UNIT/ML Solostar Pen Commonly known as:  LANTUS Inject 20 Units into the skin daily at 10 pm.   insulin lispro 100 UNIT/ML KiwkPen Commonly known as:  HUMALOG KWIKPEN Inject 0.03 mLs (3 Units total) into the skin 3 (three) times daily.   Insulin Pen Needle 31G X 5 MM  Misc Use with insulin pens 4 times daily   ipratropium-albuterol 0.5-2.5 (3) MG/3ML Soln Commonly known as:  DUONEB Inhale 2.5 mLs into the lungs every 8 (eight) hours as needed for shortness of breath.   levothyroxine 100 MCG tablet Commonly known as:  SYNTHROID, LEVOTHROID TAKE 1 TABLET BY MOUTH FOR THYROID SUPPLEMENT   sertraline 100 MG tablet Commonly known as:  ZOLOFT Take 1 tablet (100 mg total) by mouth daily. What changed:  See the new instructions.      Follow-up Information    Sheets, Angelique, FNP. Schedule an appointment as soon as possible for a visit in 1 week(s).   Specialty:  Family Medicine Contact information: [address] [place] [address] [place]. Schedule an appointment as soon as possible for a visit in 2 week(s).   Why:  Follow up partial seizures, follow up hospitalization  Contact information: [place] [address] [phone]         Allergies  Allergen Reactions  . Lisinopril Swelling    Consultations:  Neurology    Procedures/Studies: Dg Chest 2 View  Result Date: 08/06/2016 CLINICAL DATA:  Altered mental status EXAM: CHEST  2 VIEW COMPARISON:  06/21/2015 FINDINGS: Low volume chest which accentuates heart size. Cardiomediastinal contours are stable. Interstitial crowding without edema or consolidation. Artifact from skin  folds and EKG leads. Limited lateral view due to overlapping arms. IMPRESSION: Limited low volume chest without acute finding. Electronically Signed   By: Monte Fantasia M.D.   On: 08/06/2016 15:03   Ct Head Wo Contrast  Result Date: 08/06/2016 CLINICAL DATA:  Altered mental status.  Alzheimer's dementia EXAM: CT HEAD WITHOUT CONTRAST TECHNIQUE: Contiguous axial images were obtained from the base of the skull through the vertex without intravenous contrast. COMPARISON:  06/21/2015 FINDINGS: Brain: No evidence of acute  infarction, hemorrhage, obstructive hydrocephalus, extra-axial collection or mass lesion/mass effect. Advanced atrophy with ventriculomegaly. Medial temporal atrophy is severe, correlating with Alzheimer's disease history. Cannot exclude a degree of communicating hydrocephalus given the difference between ventriculomegaly to sulcal widening. Confluent cerebral white matter disease. Vascular: Atherosclerosis.  No hyperdense vessel. Skull: No acute finding Sinuses/Orbits: No acute finding IMPRESSION: 1. No acute finding. 2. Atrophy, ventriculomegaly, and extensive chronic small vessel ischemia. Electronically Signed   By: Monte Fantasia M.D.   On: 08/06/2016 15:49   Mr Brain Wo Contrast  Result Date: 08/07/2016 CLINICAL DATA:  Severe Alzheimer's disease. Decreased level of consciousness. EXAM: MRI HEAD WITHOUT CONTRAST TECHNIQUE: Multiplanar, multiecho pulse sequences of the brain and surrounding structures were obtained without intravenous contrast. COMPARISON:  CT head 08/06/2016 FINDINGS: Brain: No acute stroke, acute hemorrhage, intracranial mass lesion, or extra-axial fluid. Extreme ventriculomegaly, somewhat out of proportion to the degree of cortical atrophy, could suggest a degree of normal pressure hydrocephalus, but given the extensive white matter disease, and intrinsic temporal lobe volume loss, ex vacuo enlargement of the ventricles is likely the primary insult. Vascular: Normal flow voids. Skull and upper cervical spine: Normal marrow signal. Sinuses/Orbits: Negative. Other: None. IMPRESSION: No acute intracranial findings. Extreme ventriculomegaly, see discussion above. Severe brain substance loss primarily affecting the temporal lobes correlating with Alzheimer's. Extensive chronic microvascular ischemic change. Electronically Signed   By: Staci Righter M.D.   On: 08/07/2016 19:10    EEG IMPRESSION: This is an abnormal EEG demonstrating a moderate diffuse slowing of electrocerebral activity.   This can be seen in a wide variety of encephalopathic state including those of a toxic, metabolic, or degenerative nature.  There were no focal, hemispheric, or lateralizing features.  No epileptiform activity was recorded.    Discharge Exam: Vitals:   08/10/16 0800 08/10/16 1203  BP: 108/61 (!) 154/77  Pulse: 78 73  Resp: 19 (!) 25  Temp:  98.5 F (36.9 C)   Vitals:   08/10/16 0341 08/10/16 0737 08/10/16 0800 08/10/16 1203  BP:  140/73 108/61 (!) 154/77  Pulse:  76 78 73  Resp:  (!) 22 19 (!) 25  Temp: 98.9 F (37.2 C) 99 F (37.2 C)  98.5 F (36.9 C)  TempSrc: Oral Axillary  Axillary  SpO2:  97% 97% 95%  Weight:      Height:        General: Pt is alert, awake, non verbal, non interactive  Cardiovascular: RRR, S1/S2 +, no rubs, no gallops Respiratory: CTA bilaterally, no wheezing, no rhonchi Abdominal: Soft, NT, ND, bowel sounds + Extremities: no edema, no cyanosis     The results of significant diagnostics from this hospitalization (including imaging, microbiology, ancillary and laboratory) are listed below for reference.     Microbiology: Recent Results (from the past 240 hour(s))  Blood Culture (routine x 2)     Status: None (Preliminary result)   Collection Time: 08/06/16  3:18 PM  Result Value Ref Range Status   Specimen Description BLOOD  RIGHT HAND  Final   Special Requests IN PEDIATRIC BOTTLE Blood Culture adequate volume  Final   Culture   Final    NO GROWTH 4 DAYS Performed at [facility], [place] [address] [place], [place] [address]    Report Status PENDING  Incomplete  Blood Culture (routine x 2)     Status: None (Preliminary result)   Collection Time: 08/06/16  4:00 PM  Result Value Ref Range Status   Specimen Description BLOOD BLOOD RIGHT FOREARM  Final   Special Requests   Final    BOTTLES DRAWN AEROBIC AND ANAEROBIC Blood Culture adequate volume   Culture   Final    NO GROWTH 4 DAYS Performed at [facility], [place] [address],  [place], [place] [address]    Report Status PENDING  Incomplete  MRSA PCR Screening     Status: None   Collection Time: 08/06/16  7:26 PM  Result Value Ref Range Status   MRSA by PCR NEGATIVE NEGATIVE Final    Comment:        The GeneXpert MRSA Assay (FDA approved for NASAL specimens only), is one component of a comprehensive MRSA colonization surveillance program. It is not intended to diagnose MRSA infection nor to guide or monitor treatment for MRSA infections.      Labs: BNP (last 3 results) No results for input(s): BNP in the last 8760 hours. Basic Metabolic Panel:  Recent Labs Lab 08/06/16 2259 08/07/16 0932 08/08/16 0332 08/09/16 0158 08/09/16 0500 08/10/16 0228  NA 145 152* 153* 146*  --  140  K 3.9 3.5 3.4* 3.3*  --  3.9  CL 114* 118* 123* 115*  --  113*  CO2 '22 25 24 22  '$ --  18*  GLUCOSE 589* 326* 126* 180*  --  223*  BUN 31* 26* 21* 16  --  16  CREATININE 1.59* 1.37* 1.09* 0.94  --  0.94  CALCIUM 8.6* 8.9 8.3* 8.1*  --  7.8*  MG  --   --   --   --  2.1 2.0   Liver Function Tests:  Recent Labs Lab 08/06/16 1442 08/07/16 0932 08/08/16 0332  AST '30 24 22  '$ ALT 17 15 12*  ALKPHOS 95 75 62  BILITOT 1.5* 0.9 1.0  PROT 8.3* 6.9 6.2*  ALBUMIN 4.1 3.2* 2.9*   No results for input(s): LIPASE, AMYLASE in the last 168 hours. No results for input(s): AMMONIA in the last 168 hours. CBC:  Recent Labs Lab 08/06/16 1442 08/07/16 0932 08/08/16 0332  WBC 13.5* 11.0* 10.7*  NEUTROABS 10.7*  --   --   HGB 14.4 12.8 11.8*  HCT 43.3 39.6 37.0  MCV 89.3 90.0 90.0  PLT 274 213 203   Cardiac Enzymes: No results for input(s): CKTOTAL, CKMB, CKMBINDEX, TROPONINI in the last 168 hours. BNP: Invalid input(s): POCBNP CBG:  Recent Labs Lab 08/09/16 1319 08/09/16 1701 08/09/16 2118 08/10/16 0736 08/10/16 1201  GLUCAP 186* 223* 186* 207* 188*   D-Dimer No results for input(s): DDIMER in the last 72 hours. Hgb A1c No results for input(s): HGBA1C in the last  72 hours. Lipid Profile No results for input(s): CHOL, HDL, LDLCALC, TRIG, CHOLHDL, LDLDIRECT in the last 72 hours. Thyroid function studies No results for input(s): TSH, T4TOTAL, T3FREE, THYROIDAB in the last 72 hours.  Invalid input(s): FREET3 Anemia work up No results for input(s): VITAMINB12, FOLATE, FERRITIN, TIBC, IRON, RETICCTPCT in the last 72 hours. Urinalysis    Component  Value Date/Time   COLORURINE YELLOW 08/06/2016 1442   APPEARANCEUR CLEAR 08/06/2016 1442   LABSPEC 1.031 (H) 08/06/2016 1442   PHURINE 5.0 08/06/2016 1442   GLUCOSEU >=500 (A) 08/06/2016 1442   HGBUR SMALL (A) 08/06/2016 1442   BILIRUBINUR NEGATIVE 08/06/2016 1442   KETONESUR NEGATIVE 08/06/2016 1442   PROTEINUR NEGATIVE 08/06/2016 1442   NITRITE NEGATIVE 08/06/2016 1442   LEUKOCYTESUR NEGATIVE 08/06/2016 1442   Sepsis Labs Invalid input(s): PROCALCITONIN,  WBC,  LACTICIDVEN Microbiology Recent Results (from the past 240 hour(s))  Blood Culture (routine x 2)     Status: None (Preliminary result)   Collection Time: 08/06/16  3:18 PM  Result Value Ref Range Status   Specimen Description BLOOD RIGHT HAND  Final   Special Requests IN PEDIATRIC BOTTLE Blood Culture adequate volume  Final   Culture   Final    NO GROWTH 4 DAYS Performed at [facility], [place] [address] [place], [place] [address]    Report Status PENDING  Incomplete  Blood Culture (routine x 2)     Status: None (Preliminary result)   Collection Time: 08/06/16  4:00 PM  Result Value Ref Range Status   Specimen Description BLOOD BLOOD RIGHT FOREARM  Final   Special Requests   Final    BOTTLES DRAWN AEROBIC AND ANAEROBIC Blood Culture adequate volume   Culture   Final    NO GROWTH 4 DAYS Performed at [facility], [place] [address], [place], [place] [address]    Report Status PENDING  Incomplete  MRSA PCR Screening     Status: None   Collection Time: 08/06/16  7:26 PM  Result Value Ref Range Status   MRSA by PCR NEGATIVE  NEGATIVE Final    Comment:        The GeneXpert MRSA Assay (FDA approved for NASAL specimens only), is one component of a comprehensive MRSA colonization surveillance program. It is not intended to diagnose MRSA infection nor to guide or monitor treatment for MRSA infections.      Time coordinating discharge: 45 minutes  SIGNED:  Dessa Phi, DO [facility] Pager [phone]  If 7PM-7AM, please contact night-coverage [email or web address] Password [email or web address] 08/10/2016, 1:36 PM

## 2016-08-10 NOTE — Progress Notes (Signed)
Inpatient Diabetes Program Recommendations  AACE/ADA: New Consensus Statement on Inpatient Glycemic Control (2015)  Target Ranges:  Prepandial:   less than 140 mg/dL      Peak postprandial:   less than 180 mg/dL (1-2 hours)      Critically ill patients:  140 - 180 mg/dL   Results for [place], Candice Woodward (MRN [id number]) as of 08/10/2016 09:46  Ref. Range 5/9/20[name]18 07:41 08/09/2016 13:19 08/09/2016 17:01 08/09/2016 21:18 08/10/2016 07:36  Glucose-Capillary Latest Ref Range: 65 - 99 mg/dL 782191 (H) 956186 (H) 213223 (H) 186 (H) 207 (H)    Review of Glycemic Control  Inpatient Diabetes Program Recommendations:    Glucose levels are consistently above 180. Please consider increasing Lantus to 18-20 units to lower trends.   Thanks,  Christena DeemShannon [facility] State RN, MSN, [facility] Inpatient Diabetes Coordinator Team Pager [phone] (8a-5p)

## 2016-08-11 LAB — CULTURE, BLOOD (ROUTINE X 2)
CULTURE: NO GROWTH
Culture: NO GROWTH
SPECIAL REQUESTS: ADEQUATE
Special Requests: ADEQUATE

## 2016-08-15 ENCOUNTER — Other Ambulatory Visit: Payer: Self-pay | Admitting: Internal Medicine

## 2016-08-15 DIAGNOSIS — E119 Type 2 diabetes mellitus without complications: Secondary | ICD-10-CM

## 2016-08-15 MED ORDER — INSULIN GLARGINE 100 UNIT/ML ~~LOC~~ SOLN: SUBCUTANEOUS | 3 refills | Status: AC

## 2016-08-15 MED ORDER — INSULIN GLARGINE 100 UNIT/ML SOLOSTAR PEN: 20.0000 [IU] | PEN_INJECTOR | Freq: Every day | SUBCUTANEOUS | 0 refills | Status: DC

## 2016-08-15 MED ORDER — INSULIN GLARGINE 100 UNIT/ML ~~LOC~~ SOLN: SUBCUTANEOUS | 3 refills | Status: DC

## 2016-08-15 MED ORDER — BLOOD GLUCOSE METER KIT: PACK | 0 refills | Status: AC

## 2018-01-08 ENCOUNTER — Encounter (HOSPITAL_COMMUNITY): Payer: Self-pay | Admitting: Emergency Medicine

## 2018-01-08 ENCOUNTER — Inpatient Hospital Stay (HOSPITAL_COMMUNITY)
Admission: EM | Admit: 2018-01-08 | Discharge: 2018-01-10 | DRG: 189 | Disposition: A | Discharge status: Hospice | Attending: Family Medicine | Admitting: Family Medicine

## 2018-01-08 ENCOUNTER — Emergency Department (HOSPITAL_COMMUNITY)

## 2018-01-08 DIAGNOSIS — Z7401 Bed confinement status: Secondary | ICD-10-CM

## 2018-01-08 DIAGNOSIS — F411 Generalized anxiety disorder: Secondary | ICD-10-CM | POA: Diagnosis present

## 2018-01-08 DIAGNOSIS — E441 Mild protein-calorie malnutrition: Secondary | ICD-10-CM | POA: Diagnosis present

## 2018-01-08 DIAGNOSIS — Z23 Encounter for immunization: Secondary | ICD-10-CM

## 2018-01-08 DIAGNOSIS — Z833 Family history of diabetes mellitus: Secondary | ICD-10-CM

## 2018-01-08 DIAGNOSIS — J690 Pneumonitis due to inhalation of food and vomit: Secondary | ICD-10-CM | POA: Diagnosis present

## 2018-01-08 DIAGNOSIS — E785 Hyperlipidemia, unspecified: Secondary | ICD-10-CM | POA: Diagnosis present

## 2018-01-08 DIAGNOSIS — Z79899 Other long term (current) drug therapy: Secondary | ICD-10-CM

## 2018-01-08 DIAGNOSIS — Z8701 Personal history of pneumonia (recurrent): Secondary | ICD-10-CM

## 2018-01-08 DIAGNOSIS — J9601 Acute respiratory failure with hypoxia: Principal | ICD-10-CM | POA: Diagnosis present

## 2018-01-08 DIAGNOSIS — Z7952 Long term (current) use of systemic steroids: Secondary | ICD-10-CM

## 2018-01-08 DIAGNOSIS — E871 Hypo-osmolality and hyponatremia: Secondary | ICD-10-CM | POA: Diagnosis present

## 2018-01-08 DIAGNOSIS — Z515 Encounter for palliative care: Secondary | ICD-10-CM | POA: Diagnosis present

## 2018-01-08 DIAGNOSIS — J42 Unspecified chronic bronchitis: Secondary | ICD-10-CM | POA: Diagnosis present

## 2018-01-08 DIAGNOSIS — E039 Hypothyroidism, unspecified: Secondary | ICD-10-CM | POA: Diagnosis present

## 2018-01-08 DIAGNOSIS — Z87891 Personal history of nicotine dependence: Secondary | ICD-10-CM

## 2018-01-08 DIAGNOSIS — I513 Intracardiac thrombosis, not elsewhere classified: Secondary | ICD-10-CM | POA: Diagnosis present

## 2018-01-08 DIAGNOSIS — K589 Irritable bowel syndrome without diarrhea: Secondary | ICD-10-CM | POA: Diagnosis present

## 2018-01-08 DIAGNOSIS — Z79890 Hormone replacement therapy (postmenopausal): Secondary | ICD-10-CM

## 2018-01-08 DIAGNOSIS — I10 Essential (primary) hypertension: Secondary | ICD-10-CM | POA: Diagnosis present

## 2018-01-08 DIAGNOSIS — R638 Other symptoms and signs concerning food and fluid intake: Secondary | ICD-10-CM

## 2018-01-08 DIAGNOSIS — I272 Pulmonary hypertension, unspecified: Secondary | ICD-10-CM | POA: Diagnosis present

## 2018-01-08 DIAGNOSIS — Z87440 Personal history of urinary (tract) infections: Secondary | ICD-10-CM

## 2018-01-08 DIAGNOSIS — Z888 Allergy status to other drugs, medicaments and biological substances status: Secondary | ICD-10-CM

## 2018-01-08 DIAGNOSIS — F0281 Dementia in other diseases classified elsewhere with behavioral disturbance: Secondary | ICD-10-CM | POA: Diagnosis present

## 2018-01-08 DIAGNOSIS — N179 Acute kidney failure, unspecified: Secondary | ICD-10-CM | POA: Diagnosis present

## 2018-01-08 DIAGNOSIS — G309 Alzheimer's disease, unspecified: Secondary | ICD-10-CM | POA: Diagnosis present

## 2018-01-08 DIAGNOSIS — Z90710 Acquired absence of both cervix and uterus: Secondary | ICD-10-CM

## 2018-01-08 DIAGNOSIS — Z794 Long term (current) use of insulin: Secondary | ICD-10-CM

## 2018-01-08 DIAGNOSIS — I248 Other forms of acute ischemic heart disease: Secondary | ICD-10-CM | POA: Diagnosis present

## 2018-01-08 DIAGNOSIS — F429 Obsessive-compulsive disorder, unspecified: Secondary | ICD-10-CM | POA: Diagnosis present

## 2018-01-08 DIAGNOSIS — Z96653 Presence of artificial knee joint, bilateral: Secondary | ICD-10-CM | POA: Diagnosis present

## 2018-01-08 DIAGNOSIS — E119 Type 2 diabetes mellitus without complications: Secondary | ICD-10-CM | POA: Diagnosis present

## 2018-01-08 LAB — CBC WITH DIFFERENTIAL/PLATELET
BASOS ABS: 0 10*3/uL (ref 0.0–0.1)
BASOS PCT: 0 %
Eosinophils Absolute: 0.1 10*3/uL (ref 0.0–0.5)
Eosinophils Relative: 1 %
HEMATOCRIT: 43.5 % (ref 36.0–46.0)
Hemoglobin: 14.2 g/dL (ref 12.0–15.0)
LYMPHS PCT: 11 %
Lymphs Abs: 1.5 10*3/uL (ref 0.7–4.0)
MCH: 27.8 pg (ref 26.0–34.0)
MCHC: 32.6 g/dL (ref 30.0–36.0)
MCV: 85.1 fL (ref 80.0–100.0)
MONO ABS: 0.6 10*3/uL (ref 0.1–1.0)
Monocytes Relative: 4 %
NEUTROS ABS: 11.5 10*3/uL — AB (ref 1.7–7.7)
Neutrophils Relative %: 84 %
PLATELETS: 260 10*3/uL (ref 150–400)
RBC: 5.11 MIL/uL (ref 3.87–5.11)
RDW: 14.7 % (ref 11.5–15.5)
WBC: 13.3 10*3/uL — ABNORMAL HIGH (ref 4.0–10.5)

## 2018-01-08 LAB — I-STAT VENOUS BLOOD GAS, ED
ACID-BASE EXCESS: 1 mmol/L (ref 0.0–2.0)
BICARBONATE: 26.3 mmol/L (ref 20.0–28.0)
O2 Saturation: 87 %
PH VEN: 7.405 (ref 7.250–7.430)
TCO2: 28 mmol/L (ref 22–32)
pCO2, Ven: 41.9 mmHg — ABNORMAL LOW (ref 44.0–60.0)
pO2, Ven: 53 mmHg — ABNORMAL HIGH (ref 32.0–45.0)

## 2018-01-08 LAB — COMPREHENSIVE METABOLIC PANEL
ALBUMIN: 3.3 g/dL — AB (ref 3.5–5.0)
ALK PHOS: 76 U/L (ref 38–126)
ALT: 36 U/L (ref 0–44)
ANION GAP: 11 (ref 5–15)
AST: 30 U/L (ref 15–41)
BILIRUBIN TOTAL: 0.6 mg/dL (ref 0.3–1.2)
BUN: 23 mg/dL (ref 8–23)
CALCIUM: 8.8 mg/dL — AB (ref 8.9–10.3)
CO2: 23 mmol/L (ref 22–32)
Chloride: 93 mmol/L — ABNORMAL LOW (ref 98–111)
Creatinine, Ser: 1.2 mg/dL — ABNORMAL HIGH (ref 0.44–1.00)
GFR, EST AFRICAN AMERICAN: 48 mL/min — AB (ref >60)
GFR, EST NON AFRICAN AMERICAN: 42 mL/min — AB (ref >60)
Glucose, Bld: 326 mg/dL — ABNORMAL HIGH (ref 70–99)
POTASSIUM: 4 mmol/L (ref 3.5–5.1)
Sodium: 127 mmol/L — ABNORMAL LOW (ref 135–145)
TOTAL PROTEIN: 7.5 g/dL (ref 6.5–8.1)

## 2018-01-08 LAB — I-STAT CHEM 8, ED
BUN: 31 mg/dL — AB (ref 8–23)
CHLORIDE: 96 mmol/L — AB (ref 98–111)
CREATININE: 1.1 mg/dL — AB (ref 0.44–1.00)
Calcium, Ion: 0.93 mmol/L — ABNORMAL LOW (ref 1.15–1.40)
Glucose, Bld: 339 mg/dL — ABNORMAL HIGH (ref 70–99)
HCT: 46 % (ref 36.0–46.0)
Hemoglobin: 15.6 g/dL — ABNORMAL HIGH (ref 12.0–15.0)
Potassium: 4.4 mmol/L (ref 3.5–5.1)
SODIUM: 128 mmol/L — AB (ref 135–145)
TCO2: 24 mmol/L (ref 22–32)

## 2018-01-08 LAB — I-STAT TROPONIN, ED: TROPONIN I, POC: 0.14 ng/mL — AB (ref 0.00–0.08)

## 2018-01-08 LAB — BRAIN NATRIURETIC PEPTIDE: B Natriuretic Peptide: 219.2 pg/mL — ABNORMAL HIGH (ref 0.0–100.0)

## 2018-01-08 LAB — I-STAT CG4 LACTIC ACID, ED: Lactic Acid, Venous: 2.25 mmol/L (ref 0.5–1.9)

## 2018-01-08 MED ORDER — METHYLPREDNISOLONE SODIUM SUCC 125 MG IJ SOLR: 60.0000 mg | Freq: Once | INTRAMUSCULAR | Status: DC

## 2018-01-08 MED ORDER — ALBUTEROL SULFATE (2.5 MG/3ML) 0.083% IN NEBU
5.0000 mg | INHALATION_SOLUTION | Freq: Once | RESPIRATORY_TRACT | Status: AC
  Administered 2018-01-08: 5 mg via RESPIRATORY_TRACT
  Filled 2018-01-08: qty 6

## 2018-01-08 MED ORDER — SODIUM CHLORIDE 0.9 % IV SOLN
1.0000 g | Freq: Once | INTRAVENOUS | Status: AC
  Administered 2018-01-08: 1 g via INTRAVENOUS
  Filled 2018-01-08: qty 10

## 2018-01-08 MED ORDER — METRONIDAZOLE IN NACL 5-0.79 MG/ML-% IV SOLN
500.0000 mg | Freq: Once | INTRAVENOUS | Status: AC
  Administered 2018-01-09: 500 mg via INTRAVENOUS
  Filled 2018-01-08: qty 100

## 2018-01-08 NOTE — Progress Notes (Signed)
Patient is off NIV per MD verbal order. Placed on [place] 4L tolerating it well.

## 2018-01-08 NOTE — ED Triage Notes (Signed)
Family believes she might have aspirated on food given to her by her family.  Pt has alhiemers.  EMS found her at 80% on RA, placed on Cpap, O2 increased to 95%.  Gave 125 solumedual, 1 duoneb.

## 2018-01-08 NOTE — ED Provider Notes (Signed)
[facility] EMERGENCY DEPARTMENT Provider Note   CSN: [id number] Arrival date & time: 01/08/18  2147     History   Chief Complaint Chief Complaint  Patient presents with  . Respiratory Distress    HPI Candice Woodward is a 83 y.o. female.  HPI   83 year old female PMH significant for severe Alzheimer's disease, on hospice, who presents with acute respiratory distress.  Caregiver states that this evening while undergoing a depends change patient had aspiration-like event.  Daughter, Candice Woodward who is power of attorney, states that patient is known to hold her saliva in her mouth.  When patient rolled back onto her back during the clothing change she immediately started choking, waving her arms, and had large eyes.  EMS arrived and found patient hypoxic down to the low 80s on room air.  Patient was placed on CPAP with improvement of oxygen saturations to mid 90s.  Patient was found initially hypertensive to 657 systolic that self resolved without intervention.  Patient was given duo nebs and Solu-Medrol on route via EMS.  Patient does not have a history of COPD.  Patient does have history of similar presentation secondary to aspiration pneumonia late last year.  Patient is unable to contribute to history.  Past Medical History:  Diagnosis Date  . Acute bronchitis   . Alzheimer disease ([place])   . Alzheimer's disease ([place])   . Anxiety state, unspecified   . Dementia in conditions classified elsewhere with behavioral disturbance   . Disorder of bone and cartilage, unspecified   . Dizziness and giddiness   . Hypertension   . IBS (irritable bowel syndrome)   . Irritable bowel syndrome   . Lumbago   . Malnutrition of mild degree ([place])   . Obsessive-compulsive disorders   . Other and unspecified hyperlipidemia   . Personal history of urinary (tract) infection   . Rickets, active   . Senile dementia, uncomplicated ([place])   . Thyroid disease     Patient Active Problem  List   Diagnosis Date Noted  . New onset type 2 diabetes mellitus ([place]) 08/15/2016  . Decreased oral intake   . Goals of care, counseling/discussion   . Palliative care encounter   . Seizure ([place]) 08/06/2016  . Pressure ulcer of both heels, unstageable ([place]) 07/01/2015  . Urinary and fecal incontinence 01/04/2015  . Essential hypertension 01/04/2015  . Gait instability 01/04/2015  . Poor vision 01/04/2015  . Depression 01/04/2015  . Obsessive compulsive disorder 01/04/2015  . Hypothyroidism 08/08/2013  . Alzheimer disease ([place])   . Irritable bowel syndrome   . Malnutrition of mild degree ([place])   . Thyroid disease   . Hypertension     Past Surgical History:  Procedure Laterality Date  . PARTIAL HYSTERECTOMY    . REPLACEMENT TOTAL KNEE     bilateral  . THYROID SURGERY       OB History   None      Home Medications    Prior to Admission medications   Medication Sig Start Date End Date Taking? Authorizing Provider  divalproex (DEPAKOTE SPRINKLE) 125 MG capsule Take 2 capsules (250 mg total) by mouth every 12 (twelve) hours. 08/10/16  Yes Dessa Phi, DO  hydrochlorothiazide (HYDRODIURIL) 25 MG tablet Take 1 tablet (25 mg total) by mouth daily. 08/10/16  Yes Dessa Phi, DO  Insulin Glargine (LANTUS) 100 UNIT/ML Solostar Pen Inject 20 Units into the skin daily at 10 pm. 08/15/16 01/08/18 Yes Rama, Venetia Maxon, MD  insulin  lispro (HUMALOG KWIKPEN) 100 UNIT/ML KiwkPen Inject 0.03 mLs (3 Units total) into the skin 3 (three) times daily. 08/10/16 01/08/18 Yes Dessa Phi, DO  ipratropium-albuterol (DUONEB) 0.5-2.5 (3) MG/3ML SOLN Inhale 2.5 mLs into the lungs every 8 (eight) hours as needed for shortness of breath. 07/21/16  Yes [provider]  levothyroxine (SYNTHROID, LEVOTHROID) 100 MCG tablet TAKE 1 TABLET BY MOUTH FOR THYROID SUPPLEMENT Patient taking differently: Take 100 mcg by mouth daily before breakfast.  06/01/15  Yes Candice, Tiffany L, DO  predniSONE  (DELTASONE) 5 MG tablet Take 5 mg by mouth daily with breakfast.   Yes [provider]  blood glucose meter kit and supplies Dispense based on patient and insurance preference. Use up to four times daily as directed. (FOR ICD-9 250.00, 250.01). 08/15/16   Rama, Venetia Maxon, MD  insulin glargine (LANTUS) 100 UNIT/ML injection Inject 20 units of Lantus every evening at 10 pm. Patient not taking: Reported on 01/08/2018 08/15/16 01/08/18  Rama, Venetia Maxon, MD  Insulin Pen Needle 31G X 5 MM MISC Use with insulin pens 4 times daily 08/10/16   Dessa Phi, DO  sertraline (ZOLOFT) 100 MG tablet Take 1 tablet (100 mg total) by mouth daily. Patient not taking: Reported on 01/08/2018 08/10/16   Dessa Phi, DO    Family History Family History  Problem Relation Age of Onset  . Aneurysm Mother        abdominal aortic  . Cancer Father        bone  . Cancer Sister        colon  . Diabetes Sister     Social History Social History   Tobacco Use  . Smoking status: Never Smoker  . Smokeless tobacco: Current User    Types: Snuff  Substance Use Topics  . Alcohol use: No    Alcohol/week: 0.0 standard drinks  . Drug use: No     Allergies   Lisinopril   Review of Systems Review of Systems  Unable to perform ROS: Dementia  Constitutional: Negative for fever.  Respiratory: Negative for cough.   Cardiovascular: Negative for leg swelling.  Gastrointestinal: Positive for constipation. Negative for nausea and vomiting.  Skin: Negative for rash.     Physical Exam Updated Vital Signs BP (!) 149/86   Pulse (!) 109   Temp 97.8 F (36.6 C) (Tympanic)   Resp (!) 36   SpO2 93%   Physical Exam  Constitutional: She appears well-developed and well-nourished. She appears distressed.  HENT:  Head: Normocephalic and atraumatic.  Eyes: Conjunctivae are normal.  Neck: Neck supple.  Cardiovascular: Regular rhythm. Tachycardia present.  No murmur heard. Pulmonary/Chest: No accessory  muscle usage. Tachypnea noted. She is in respiratory distress. She has no decreased breath sounds. She has wheezes in the right upper field, the right middle field, the right lower field, the left upper field, the left middle field and the left lower field. She has rhonchi in the right upper field, the right middle field, the right lower field, the left upper field, the left middle field and the left lower field. She has rales in the right upper field, the right middle field, the right lower field, the left upper field, the left middle field and the left lower field.  Abdominal: Soft. She exhibits distension. There is no tenderness.  Musculoskeletal: She exhibits no edema.  Neurological: She is alert.  Skin: Skin is warm. Capillary refill takes less than 2 seconds. She is diaphoretic.  Psychiatric: She has a  normal mood and affect.  Nursing note and vitals reviewed.    ED Treatments / Results  Labs (all labs ordered are listed, but only abnormal results are displayed) Labs Reviewed  COMPREHENSIVE METABOLIC PANEL - Abnormal; Notable for the following components:      Result Value   Sodium 127 (*)    Chloride 93 (*)    Glucose, Bld 326 (*)    Creatinine, Ser 1.20 (*)    Calcium 8.8 (*)    Albumin 3.3 (*)    GFR calc non Af Amer 42 (*)    GFR calc Af Amer 48 (*)    All other components within normal limits  CBC WITH DIFFERENTIAL/PLATELET - Abnormal; Notable for the following components:   WBC 13.3 (*)    Neutro Abs 11.5 (*)    All other components within normal limits  BRAIN NATRIURETIC PEPTIDE - Abnormal; Notable for the following components:   B Natriuretic Peptide 219.2 (*)    All other components within normal limits  I-STAT CHEM 8, ED - Abnormal; Notable for the following components:   Sodium 128 (*)    Chloride 96 (*)    BUN 31 (*)    Creatinine, Ser 1.10 (*)    Glucose, Bld 339 (*)    Calcium, Ion 0.93 (*)    Hemoglobin 15.6 (*)    All other components within normal limits   I-STAT TROPONIN, ED - Abnormal; Notable for the following components:   Troponin i, poc 0.14 (*)    All other components within normal limits  I-STAT VENOUS BLOOD GAS, ED - Abnormal; Notable for the following components:   pCO2, Ven 41.9 (*)    pO2, Ven 53.0 (*)    All other components within normal limits  URINE CULTURE  CULTURE, BLOOD (ROUTINE X 2)  CULTURE, BLOOD (ROUTINE X 2)  URINALYSIS, ROUTINE W REFLEX MICROSCOPIC  BLOOD GAS, VENOUS  I-STAT ARTERIAL BLOOD GAS, ED  I-STAT CG4 LACTIC ACID, ED    EKG EKG Interpretation  Date/Time:  Tuesday January 08 2018 22:29:36 EDT Ventricular Rate:  109 PR Interval:    QRS Duration: 87 QT Interval:  331 QTC Calculation: 446 R Axis:   -35 Text Interpretation:  Sinus tachycardia Left ventricular hypertrophy Inferior infarct, acute (RCA) Anterior infarct, old Lateral leads are also involved Probable RV involvement, suggest recording right precordial leads Confirmed by Quintella Reichert [phone]) on 01/08/2018 10:47:37 PM   Radiology Dg Abdomen 1 View  Result Date: 01/08/2018 CLINICAL DATA:  Shortness of breath EXAM: ABDOMEN - 1 VIEW COMPARISON:  None. FINDINGS: Scattered gas-filled small and large bowel without distention. Stomach is moderately gas distended. No radiopaque stones. Multiple calcified phleboliths in the pelvis. Vascular calcifications. Degenerative changes in the spine and hips. IMPRESSION: Nonobstructive bowel gas pattern. Moderate gaseous distention of the stomach may be due to dysmotility. Electronically Signed   By: Lucienne Capers M.D.   On: 01/08/2018 22:22   Dg Chest Portable 1 View  Result Date: 01/08/2018 CLINICAL DATA:  Shortness of breath EXAM: PORTABLE CHEST 1 VIEW COMPARISON:  08/06/2016 FINDINGS: Shallow inspiration with linear atelectasis in the left lung base. Cardiac enlargement with mild vascular congestion. No edema or consolidation. No blunting of costophrenic angles. No pneumothorax. Mediastinal contours  appear intact. Tortuous aorta. Degenerative changes in the spine and shoulders. IMPRESSION: Cardiac enlargement with mild vascular congestion. No edema or consolidation. Atelectasis in the left lung base. Electronically Signed   By: Lucienne Capers M.D.   On: 01/08/2018 22:21  Procedures Procedures (including critical care time)  Medications Ordered in ED Medications  cefTRIAXone (ROCEPHIN) 1 g in sodium chloride 0.9 % 100 mL IVPB (has no administration in time range)  metroNIDAZOLE (FLAGYL) IVPB 500 mg (has no administration in time range)  methylPREDNISolone sodium succinate (SOLU-MEDROL) 125 mg/2 mL injection 60 mg (60 mg Intravenous Not Given 01/08/18 2344)  albuterol (PROVENTIL) (2.5 MG/3ML) 0.083% nebulizer solution 5 mg (5 mg Nebulization Given 01/08/18 2207)     Initial Impression / Assessment and Plan / ED Course  I have reviewed the triage vital signs and the nursing notes.  Pertinent labs & imaging results that were available during my care of the patient were reviewed by me and considered in my medical decision making (see chart for details).     83 year old female PMH significant for severe Alzheimer's disease, on hospice, who presents with acute respiratory distress.  History as above.  DDX includes flash pulmonary edema, pneumonia, aspiration pneumonitis, or aspiration pneumonia.  Patient noted to be hypertensive to 190s, afebrile, satting 100 % on BiPAP.  Patient tachypneic to 30s.  Clinical suspicion initially for aspiration pneumonia over flash pulmonary edema.  Labs and imaging performed.  Reveals sodium 128, blood glucose 339, sodium corrects to 132.  BUN 23, creatinine 1.2 baseline around 0.9 yet history of priors in range of 1.2.  WBC 13.3 with left shift.  BNP 1019.  Initial troponin 0.14.  Troponin elevation thought secondary to demand ischemia.  We will continue to trend.  EKG difficult to assess due to significant baseline wander.  Patient with normal sinus  rhythm, tachycardic, no signs of contiguous ST elevation or deviation, LVH pattern, no signs of T wave inversion.  Patient trialed off BiPAP satting 94% on 4 Woodward, breathing 23 times per minute, heart rate 105.  Patient treated empirically for aspiration pneumonia.  Patient felt stable for floor.  Patient admitted to hospitalist service for continued management care.  Patient was seeing Dr. Arlean Hopping Dr. Ayesha Rumpf who agrees with plan and disposition.  Final Clinical Impressions(s) / ED Diagnoses   Final diagnoses:  Aspiration pneumonia, unspecified aspiration pneumonia type, unspecified laterality, unspecified part of lung [facility])    ED Discharge Orders    None       Keenan Bachelor, MD 01/08/18 2350    Quintella Reichert, MD 01/13/18 1458

## 2018-01-08 NOTE — ED Notes (Signed)
ED Provider at bedside. 

## 2018-01-09 ENCOUNTER — Other Ambulatory Visit (HOSPITAL_COMMUNITY): Payer: Medicare Other

## 2018-01-09 ENCOUNTER — Inpatient Hospital Stay (HOSPITAL_COMMUNITY)

## 2018-01-09 ENCOUNTER — Encounter (HOSPITAL_COMMUNITY): Payer: Self-pay

## 2018-01-09 ENCOUNTER — Other Ambulatory Visit: Payer: Self-pay

## 2018-01-09 DIAGNOSIS — K589 Irritable bowel syndrome without diarrhea: Secondary | ICD-10-CM | POA: Diagnosis present

## 2018-01-09 DIAGNOSIS — I248 Other forms of acute ischemic heart disease: Secondary | ICD-10-CM | POA: Diagnosis present

## 2018-01-09 DIAGNOSIS — E871 Hypo-osmolality and hyponatremia: Secondary | ICD-10-CM | POA: Diagnosis present

## 2018-01-09 DIAGNOSIS — N179 Acute kidney failure, unspecified: Secondary | ICD-10-CM

## 2018-01-09 DIAGNOSIS — F0281 Dementia in other diseases classified elsewhere with behavioral disturbance: Secondary | ICD-10-CM | POA: Diagnosis present

## 2018-01-09 DIAGNOSIS — Z96653 Presence of artificial knee joint, bilateral: Secondary | ICD-10-CM | POA: Diagnosis present

## 2018-01-09 DIAGNOSIS — E039 Hypothyroidism, unspecified: Secondary | ICD-10-CM | POA: Diagnosis present

## 2018-01-09 DIAGNOSIS — I513 Intracardiac thrombosis, not elsewhere classified: Secondary | ICD-10-CM | POA: Diagnosis present

## 2018-01-09 DIAGNOSIS — E441 Mild protein-calorie malnutrition: Secondary | ICD-10-CM | POA: Diagnosis present

## 2018-01-09 DIAGNOSIS — Z90710 Acquired absence of both cervix and uterus: Secondary | ICD-10-CM | POA: Diagnosis not present

## 2018-01-09 DIAGNOSIS — F411 Generalized anxiety disorder: Secondary | ICD-10-CM | POA: Diagnosis present

## 2018-01-09 DIAGNOSIS — I272 Pulmonary hypertension, unspecified: Secondary | ICD-10-CM | POA: Diagnosis present

## 2018-01-09 DIAGNOSIS — I340 Nonrheumatic mitral (valve) insufficiency: Secondary | ICD-10-CM | POA: Diagnosis not present

## 2018-01-09 DIAGNOSIS — Z79890 Hormone replacement therapy (postmenopausal): Secondary | ICD-10-CM | POA: Diagnosis not present

## 2018-01-09 DIAGNOSIS — E119 Type 2 diabetes mellitus without complications: Secondary | ICD-10-CM | POA: Diagnosis present

## 2018-01-09 DIAGNOSIS — I10 Essential (primary) hypertension: Secondary | ICD-10-CM | POA: Diagnosis present

## 2018-01-09 DIAGNOSIS — E785 Hyperlipidemia, unspecified: Secondary | ICD-10-CM | POA: Diagnosis present

## 2018-01-09 DIAGNOSIS — J690 Pneumonitis due to inhalation of food and vomit: Secondary | ICD-10-CM | POA: Diagnosis present

## 2018-01-09 DIAGNOSIS — Z794 Long term (current) use of insulin: Secondary | ICD-10-CM | POA: Diagnosis not present

## 2018-01-09 DIAGNOSIS — Z23 Encounter for immunization: Secondary | ICD-10-CM | POA: Diagnosis not present

## 2018-01-09 DIAGNOSIS — I361 Nonrheumatic tricuspid (valve) insufficiency: Secondary | ICD-10-CM | POA: Diagnosis not present

## 2018-01-09 DIAGNOSIS — G309 Alzheimer's disease, unspecified: Secondary | ICD-10-CM | POA: Diagnosis present

## 2018-01-09 DIAGNOSIS — J9601 Acute respiratory failure with hypoxia: Secondary | ICD-10-CM | POA: Diagnosis present

## 2018-01-09 DIAGNOSIS — F429 Obsessive-compulsive disorder, unspecified: Secondary | ICD-10-CM | POA: Diagnosis present

## 2018-01-09 DIAGNOSIS — J42 Unspecified chronic bronchitis: Secondary | ICD-10-CM | POA: Diagnosis present

## 2018-01-09 DIAGNOSIS — Z515 Encounter for palliative care: Secondary | ICD-10-CM | POA: Diagnosis present

## 2018-01-09 LAB — GLUCOSE, CAPILLARY
GLUCOSE-CAPILLARY: 340 mg/dL — AB (ref 70–99)
Glucose-Capillary: 330 mg/dL — ABNORMAL HIGH (ref 70–99)
Glucose-Capillary: 353 mg/dL — ABNORMAL HIGH (ref 70–99)
Glucose-Capillary: 330 mg/dL — ABNORMAL HIGH (ref 70–99)

## 2018-01-09 LAB — URINALYSIS, ROUTINE W REFLEX MICROSCOPIC
BILIRUBIN URINE: NEGATIVE
Bacteria, UA: NONE SEEN
Glucose, UA: >=500 mg/dL — AB
KETONES UR: NEGATIVE mg/dL
LEUKOCYTES UA: NEGATIVE
Nitrite: NEGATIVE
PROTEIN: 30 mg/dL — AB
Specific Gravity, Urine: 1.016 (ref 1.005–1.030)
pH: 5 (ref 5.0–8.0)

## 2018-01-09 LAB — CBC
HCT: 36.8 % (ref 36.0–46.0)
Hemoglobin: 12.1 g/dL (ref 12.0–15.0)
MCH: 27.9 pg (ref 26.0–34.0)
MCHC: 32.9 g/dL (ref 30.0–36.0)
MCV: 85 fL (ref 80.0–100.0)
PLATELETS: 244 10*3/uL (ref 150–400)
RBC: 4.33 MIL/uL (ref 3.87–5.11)
RDW: 14.6 % (ref 11.5–15.5)
WBC: 14.9 10*3/uL — ABNORMAL HIGH (ref 4.0–10.5)
nRBC: 0 % (ref 0.0–0.2)

## 2018-01-09 LAB — D-DIMER, QUANTITATIVE (NOT AT ARMC): D DIMER QUANT: 19.7 ug{FEU}/mL — AB (ref 0.00–0.50)

## 2018-01-09 LAB — BASIC METABOLIC PANEL
Anion gap: 12 (ref 5–15)
BUN: 25 mg/dL — ABNORMAL HIGH (ref 8–23)
CHLORIDE: 94 mmol/L — AB (ref 98–111)
CO2: 22 mmol/L (ref 22–32)
Calcium: 8.5 mg/dL — ABNORMAL LOW (ref 8.9–10.3)
Creatinine, Ser: 1.2 mg/dL — ABNORMAL HIGH (ref 0.44–1.00)
GFR calc non Af Amer: 42 mL/min — ABNORMAL LOW (ref >60)
GFR, EST AFRICAN AMERICAN: 48 mL/min — AB (ref >60)
Glucose, Bld: 348 mg/dL — ABNORMAL HIGH (ref 70–99)
POTASSIUM: 4.4 mmol/L (ref 3.5–5.1)
SODIUM: 128 mmol/L — AB (ref 135–145)

## 2018-01-09 LAB — PROCALCITONIN: PROCALCITONIN: 0.4 ng/mL

## 2018-01-09 LAB — TROPONIN I
Troponin I: 0.74 ng/mL (ref <0.03)
Troponin I: 0.81 ng/mL (ref <0.03)
Troponin I: 0.67 ng/mL (ref <0.03)

## 2018-01-09 LAB — LACTIC ACID, PLASMA: LACTIC ACID, VENOUS: 3.1 mmol/L — AB (ref 0.5–1.9)

## 2018-01-09 LAB — HEMOGLOBIN A1C
HEMOGLOBIN A1C: 8.7 % — AB (ref 4.8–5.6)
Mean Plasma Glucose: 202.99 mg/dL

## 2018-01-09 LAB — MRSA PCR SCREENING: MRSA BY PCR: NEGATIVE

## 2018-01-09 MED ORDER — INSULIN ASPART 100 UNIT/ML ~~LOC~~ SOLN
0.0000 [IU] | Freq: Three times a day (TID) | SUBCUTANEOUS | Status: DC
  Administered 2018-01-09: 15 [IU] via SUBCUTANEOUS

## 2018-01-09 MED ORDER — INFLUENZA VAC SPLIT HIGH-DOSE 0.5 ML IM SUSY
0.5000 mL | PREFILLED_SYRINGE | INTRAMUSCULAR | Status: AC
  Administered 2018-01-10: 0.5 mL via INTRAMUSCULAR
  Filled 2018-01-09: qty 0.5

## 2018-01-09 MED ORDER — IOPAMIDOL (ISOVUE-370) INJECTION 76%
100.0000 mL | Freq: Once | INTRAVENOUS | Status: AC
  Administered 2018-01-09: 65 mL via INTRAVENOUS
  Administered 2018-01-09: 100 mL via INTRAVENOUS

## 2018-01-09 MED ORDER — IPRATROPIUM-ALBUTEROL 0.5-2.5 (3) MG/3ML IN SOLN
3.0000 mL | Freq: Three times a day (TID) | RESPIRATORY_TRACT | Status: DC
  Administered 2018-01-09 – 2018-01-10 (×4): 3 mL via RESPIRATORY_TRACT
  Filled 2018-01-09 (×5): qty 3

## 2018-01-09 MED ORDER — INSULIN ASPART 100 UNIT/ML ~~LOC~~ SOLN
0.0000 [IU] | Freq: Three times a day (TID) | SUBCUTANEOUS | Status: DC
  Administered 2018-01-09 (×2): 7 [IU] via SUBCUTANEOUS

## 2018-01-09 MED ORDER — ENOXAPARIN SODIUM 30 MG/0.3ML ~~LOC~~ SOLN
30.0000 mg | SUBCUTANEOUS | Status: DC
  Administered 2018-01-09 – 2018-01-10 (×2): 30 mg via SUBCUTANEOUS
  Filled 2018-01-09 (×2): qty 0.3

## 2018-01-09 MED ORDER — SODIUM CHLORIDE 0.9 % IV BOLUS: 1000.0000 mL | Freq: Once | INTRAVENOUS | Status: DC

## 2018-01-09 MED ORDER — SODIUM CHLORIDE 0.9 % IV SOLN
500.0000 mg | INTRAVENOUS | Status: DC
  Administered 2018-01-09: 500 mg via INTRAVENOUS
  Filled 2018-01-09 (×2): qty 500

## 2018-01-09 MED ORDER — SODIUM CHLORIDE 0.9 % IV SOLN: INTRAVENOUS | Status: AC

## 2018-01-09 MED ORDER — INSULIN GLARGINE 100 UNIT/ML ~~LOC~~ SOLN
20.0000 [IU] | Freq: Every day | SUBCUTANEOUS | Status: DC
  Administered 2018-01-09: 20 [IU] via SUBCUTANEOUS
  Filled 2018-01-09 (×2): qty 0.2

## 2018-01-09 MED ORDER — IPRATROPIUM-ALBUTEROL 0.5-2.5 (3) MG/3ML IN SOLN
3.0000 mL | Freq: Four times a day (QID) | RESPIRATORY_TRACT | Status: DC
  Administered 2018-01-09 (×2): 3 mL via RESPIRATORY_TRACT
  Filled 2018-01-09: qty 3

## 2018-01-09 MED ORDER — METRONIDAZOLE IN NACL 5-0.79 MG/ML-% IV SOLN
500.0000 mg | Freq: Three times a day (TID) | INTRAVENOUS | Status: DC
  Administered 2018-01-09 – 2018-01-10 (×5): 500 mg via INTRAVENOUS
  Filled 2018-01-09 (×5): qty 100

## 2018-01-09 MED ORDER — INSULIN ASPART 100 UNIT/ML ~~LOC~~ SOLN
0.0000 [IU] | Freq: Every day | SUBCUTANEOUS | Status: DC
  Administered 2018-01-09: 4 [IU] via SUBCUTANEOUS

## 2018-01-09 MED ORDER — PREDNISONE 20 MG PO TABS
40.0000 mg | ORAL_TABLET | Freq: Every day | ORAL | Status: DC
  Administered 2018-01-09 – 2018-01-10 (×2): 40 mg via ORAL
  Filled 2018-01-09 (×2): qty 2

## 2018-01-09 MED ORDER — SODIUM CHLORIDE 0.9 % IV SOLN
1.0000 g | INTRAVENOUS | Status: DC
  Administered 2018-01-09: 1 g via INTRAVENOUS
  Filled 2018-01-09 (×2): qty 10

## 2018-01-09 MED ORDER — IOPAMIDOL (ISOVUE-370) INJECTION 76%
INTRAVENOUS | Status: AC
  Administered 2018-01-09: 100 mL via INTRAVENOUS
  Filled 2018-01-09: qty 100

## 2018-01-09 MED ORDER — IPRATROPIUM-ALBUTEROL 0.5-2.5 (3) MG/3ML IN SOLN
2.5000 mL | Freq: Four times a day (QID) | RESPIRATORY_TRACT | Status: DC
  Filled 2018-01-09: qty 3

## 2018-01-09 MED ORDER — SERTRALINE HCL 100 MG PO TABS: 100.0000 mg | ORAL_TABLET | Freq: Every day | ORAL | Status: DC

## 2018-01-09 MED ORDER — ALBUTEROL SULFATE (2.5 MG/3ML) 0.083% IN NEBU: 5.0000 mg | INHALATION_SOLUTION | Freq: Two times a day (BID) | RESPIRATORY_TRACT | Status: DC | PRN

## 2018-01-09 MED ORDER — LEVOTHYROXINE SODIUM 100 MCG PO TABS
100.0000 ug | ORAL_TABLET | Freq: Every day | ORAL | Status: DC
  Administered 2018-01-09 – 2018-01-10 (×2): 100 ug via ORAL
  Filled 2018-01-09 (×2): qty 1

## 2018-01-09 MED ORDER — DIVALPROEX SODIUM 125 MG PO CSDR
250.0000 mg | DELAYED_RELEASE_CAPSULE | Freq: Two times a day (BID) | ORAL | Status: DC
  Administered 2018-01-09: 250 mg via ORAL
  Administered 2018-01-09: 125 mg via ORAL
  Administered 2018-01-10: 250 mg via ORAL
  Filled 2018-01-09 (×4): qty 2

## 2018-01-09 NOTE — ED Notes (Signed)
Placed pt on regular hospital bed for comfort.  Brought in recliner for pt's daughter.

## 2018-01-09 NOTE — Progress Notes (Signed)
[facility] 2W-02 [facility] ([facility]) GIP RN Visit at 1400  This is a related and covered GIP admission of 01/09/2018 with [facility] diagnosis of Alzheimer's disease per [facility] physician Dr. Barbee Shropshire. Patient has OOF DNR. Daughter called [facility] evening of 01/08/2018 when she noted her mother to have difficulty breathing. [facility] on-call RN advised daughter to call 911 and asked that [facility] evaluate patient and call [facility] when on scene. [facility] did not call [facility] prior to transfer to ED. Triage notes report EMS reported O2 sats at 80% on RA, pt was placed on CPAP and O2 increased to 95%. EMS also administered 125 mg solumedrol and 1 duoneb. Hypertension was also reported with systolic BP 210's that resolved without intervention.  Daughter reported to ED MD that patient had an aspiration like event while undergoing a Depends change. Pt is known to hold saliva in her mouth and when she was rolled onto her back she began choking. Pt has a history of similar presentation with admission due to aspiration pneumonia late last year.  Pt was admitted to the hospital with diagnosis of Acute hypoxemic respiratory failure. Etiology aspiration pneumonia versus acute exacerbation of chronic bronchitis.  [facility] Day 1 of GIP.  Continuous medications include: 0.9 % sodium chloride at 100 ml/hr, azithromycin 500 mg IV every 24 hours, Rocephin 1 G every 24 hours, Flagyl 500 mg every 8 hours, Duoneb 0.5-2.5 mg/26ml nebulizer three times daily, Prednisone 40 mg every day. No prn medications administered.  Visited with patient and daughter in room. Pt is non-verbal and does not open her eyes. She does not appear to be in any distress. O2 at 2 lpm with O2 sats 97%. Purewick catheter in place but no urine in tubing or wall canister. Bedside RN reports patient is doing well without any distress today.  Daughter, Candice Woodward states she feels her mom is doing better and is appreciative of [facility] RN visit. She relates that [facility] LCSW Candice Woodward has called and will also be visiting. Candice Woodward denies any needs at this time.  Goals of Care: management of respiratory symptoms and comfort  Discharge Planning: daughter Candice Woodward anticipates pt to return home at time of discharge with [facility] to follow. Should ambulance transport be needed at time of discharge, please call [facility] at [phone] as [facility] contracts with [facility] for transportation needs.  Communication to [facility]: as above  Communication to [facility]: as above and per clinical note; Jiles Garter, NP notified of patient admission.  Transfer summary and current [facility] medication list placed on shadow chart.  Please call with any hospice related questions or concerns.  Thank you, Candice Bast, RN, BSN [facility] Liaison [phone]  [facility] hospital Liaisons are on [facility]ION.

## 2018-01-09 NOTE — Progress Notes (Signed)
Patient seen and evaluated, chart reviewed, please see EMR for updated orders. Please see full H&P dictated by admitting physician Dr.Rathore  for same date of service.    Acute hypoxic respiratory failure--- CTA chest negative for PE, patient does have pulmonary hypertension, with findings of bronchitis, cannot exclude pneumonia, procalcitonin 0.40, lactic acid 2.2,  continue steroids and antibiotics pending blood cultures, continue supplemental oxygen  DM2- .  Poorly controlled, A1c 8.7, scheduled for Lantus 20 units nightly, change sliding scale to moderate dose, avoid over aggressive glycemic control due to poor oral intake at this time.  Diabetic educator input noted  Social/Ethics-as per admitting physician documentation pt is partial code.  Okay to intubate and use pressors.  No chest compressions and no defibrillation.   Patient seen and evaluated, chart reviewed, please see EMR for updated orders. Please see full H&P dictated by admitting physician Dr.Rathore  for same date of service.

## 2018-01-09 NOTE — Progress Notes (Signed)
Inpatient Diabetes Program Recommendations  AACE/ADA: New Consensus Statement on Inpatient Glycemic Control (2019)  Target Ranges:  Prepandial:   less than 140 mg/dL      Peak postprandial:   less than 180 mg/dL (1-2 hours)      Critically ill patients:  140 - 180 mg/dL   Results for TONNIA, BARDIN (MRN [id number]) as of 01/09/2018 10:27  Ref. Range 01/09/2018 08:27  Glucose-Capillary Latest Ref Range: 70 - 99 mg/dL 409 (H)  Results for FANCHON, PAPANIA (MRN [id number]) as of 01/09/2018 10:27  Ref. Range 01/09/2018 08:33  Hemoglobin A1C Latest Ref Range: 4.8 - 5.6 % 8.7 (H)   Review of Glycemic Control  Diabetes history: DM2 Outpatient Diabetes medications: Lantus 20 units QHS, Humalog 3 units TID with meals Current orders for Inpatient glycemic control: Lantus 20 units QHS, Novolog 0-9 units TID with meals; Prednisone 40 mg QAM  Inpatient Diabetes Program Recommendations:  Insulin-Basal: No Lantus given last night; ordered to start tonight at HS. Please consider changing frequency of Lantus to 20 units Q24H (to start now). Insulin-Correction: Please consider ordering Novolog 0-5 units QHS for bedtime correction.  Thanks, Orlando Penner, RN, MSN, CDE Diabetes Coordinator Inpatient Diabetes Program [phone] (Team Pager from 8am to 5pm)

## 2018-01-09 NOTE — Plan of Care (Signed)
Discussed plan of care with patient's daughter.  Patient has dementia and no longer speaks.  Daughter is primary caregiver and does an excellent job with her mother.  Patient lives with daughter and is well-taken care of.  Explained to daughter the importance of using the call bell when assistance is needed.  Good teach back displayed by daughter.

## 2018-01-09 NOTE — H&P (Signed)
History and Physical    Candice Woodward:[id number] DOB: 04/04/1934 DOA: 01/08/2018  PCP: [name], FNP Patient coming from: Home  Chief Complaint: Acute respiratory distress  HPI: Candice Woodward is a 83 y.o. female with medical history significant of Alzheimer's dementia, on hospice, hypertension, diabetes presenting with acute respiratory distress.  Patient has Alzheimer's dementia and is nonverbal at baseline.  History was provided by daughter at bedside.  Daughter states that the patient was in her usual state of health until after dinner yesterday evening.  She had a bowel movement and while daughter was cleaning her up she noticed that the patient had a lot of saliva in her mouth and was sweating excessively.  Patient appeared scared and her eyes were wide open.  Daughter checked her blood pressure at that time and it was 213/134 and pulse 135.  CBG at home was 271.  Daughter states that the patient does not have asthma or COPD but does have chronic bronchitis for which she takes albuterol as needed.  In addition, she has chronic allergies.  Daughter reports patient having prior history of aspiration pneumonia.    ED Course: SpO2 80% on RA when EMS arrived, placed on Cpap, O2 increased to 95%.  EMS gave 125 solumedrol, 1 duoneb.  Tachycardic, tachypneic, and hypertensive on arrival.  Blood pressure improved without any intervention.  She initially was placed on BiPAP in the ED and then eventually transitioned to 4 L of oxygen via nasal cannula.  White count 13.3.  BNP 219.  UA not suggestive of infection.  Troponin 0.14.  EKG showing sinus tachycardia and noncontiguous ST elevations (lead III and V3).  Chest x-ray showing cardiac enlargement with mild vascular congestion and atelectasis in the left lung base.  No edema or consolidation.  Abdominal x-ray showing nonobstructive bowel gas pattern; moderate gaseous distention of the stomach likely due to dysmotility.  Review of Systems: As  per HPI otherwise 10 point review of systems negative.  Past Medical History:  Diagnosis Date  . Acute bronchitis   . Alzheimer disease ([place])   . Alzheimer's disease ([place])   . Anxiety state, unspecified   . Dementia in conditions classified elsewhere with behavioral disturbance   . Disorder of bone and cartilage, unspecified   . Dizziness and giddiness   . Hypertension   . IBS (irritable bowel syndrome)   . Irritable bowel syndrome   . Lumbago   . Malnutrition of mild degree ([place])   . Obsessive-compulsive disorders   . Other and unspecified hyperlipidemia   . Personal history of urinary (tract) infection   . Rickets, active   . Senile dementia, uncomplicated ([place])   . Thyroid disease     Past Surgical History:  Procedure Laterality Date  . PARTIAL HYSTERECTOMY    . REPLACEMENT TOTAL KNEE     bilateral  . THYROID SURGERY       reports that she has never smoked. Her smokeless tobacco use includes snuff. She reports that she does not drink alcohol or use drugs.  Allergies  Allergen Reactions  . Lisinopril Swelling    Family History  Problem Relation Age of Onset  . Aneurysm Mother        abdominal aortic  . Cancer Father        bone  . Cancer Sister        colon  . Diabetes Sister     Prior to Admission medications   Medication Sig Start Date End Date Taking?  Authorizing Provider  divalproex (DEPAKOTE SPRINKLE) 125 MG capsule Take 2 capsules (250 mg total) by mouth every 12 (twelve) hours. 08/10/16  Yes Dessa Phi, DO  hydrochlorothiazide (HYDRODIURIL) 25 MG tablet Take 1 tablet (25 mg total) by mouth daily. 08/10/16  Yes Dessa Phi, DO  Insulin Glargine (LANTUS) 100 UNIT/ML Solostar Pen Inject 20 Units into the skin daily at 10 pm. 08/15/16 01/08/18 Yes Rama, Venetia Maxon, MD  insulin lispro (HUMALOG KWIKPEN) 100 UNIT/ML KiwkPen Inject 0.03 mLs (3 Units total) into the skin 3 (three) times daily. 08/10/16 01/08/18 Yes Dessa Phi, DO  ipratropium-albuterol  (DUONEB) 0.5-2.5 (3) MG/3ML SOLN Inhale 2.5 mLs into the lungs every 8 (eight) hours as needed for shortness of breath. 07/21/16  Yes [provider]  levothyroxine (SYNTHROID, LEVOTHROID) 100 MCG tablet TAKE 1 TABLET BY MOUTH FOR THYROID SUPPLEMENT Patient taking differently: Take 100 mcg by mouth daily before breakfast.  06/01/15  Yes Reed, Tiffany L, DO  predniSONE (DELTASONE) 5 MG tablet Take 5 mg by mouth daily with breakfast.   Yes [provider]  blood glucose meter kit and supplies Dispense based on patient and insurance preference. Use up to four times daily as directed. (FOR ICD-9 250.00, 250.01). 08/15/16   Rama, Venetia Maxon, MD  insulin glargine (LANTUS) 100 UNIT/ML injection Inject 20 units of Lantus every evening at 10 pm. Patient not taking: Reported on 01/08/2018 08/15/16 01/08/18  Rama, Venetia Maxon, MD  Insulin Pen Needle 31G X 5 MM MISC Use with insulin pens 4 times daily 08/10/16   Dessa Phi, DO  sertraline (ZOLOFT) 100 MG tablet Take 1 tablet (100 mg total) by mouth daily. Patient not taking: Reported on 01/08/2018 08/10/16   Dessa Phi, DO    Physical Exam: Vitals:   01/09/18 0245 01/09/18 0300 01/09/18 0315 01/09/18 0330  BP: 137/83 131/87 119/75 137/88  Pulse: 93 94 92 91  Resp: 20 (!) 28 (!) 22 20  Temp:      TempSrc:      SpO2: 98% 97% 94% 97%   Physical Exam  Constitutional:  Sleeping comfortably in hospital stretcher  HENT:  Head: Normocephalic and atraumatic.  Eyes: Right eye exhibits no discharge. Left eye exhibits no discharge.  Neck: Neck supple. No tracheal deviation present.  Cardiovascular: Normal rate, regular rhythm and intact distal pulses.  Pulmonary/Chest: She has wheezes.  Anterior lung fields auscultated as patient unable to move  Abdominal: Soft. Bowel sounds are normal. She exhibits no distension. There is no tenderness.  Musculoskeletal: She exhibits no edema.  Neurological:  Somnolent  Skin: Skin is warm and dry.      Labs on Admission: I have personally reviewed following labs and imaging studies  CBC: Recent Labs  Lab 01/08/18 2209 01/08/18 2216 01/09/18 0330  WBC 13.3*  --  14.9*  NEUTROABS 11.5*  --   --   HGB 14.2 15.6* 12.1  HCT 43.5 46.0 36.8  MCV 85.1  --  85.0  PLT 260  --  983   Basic Metabolic Panel: Recent Labs  Lab 01/08/18 2209 01/08/18 2216  NA 127* 128*  K 4.0 4.4  CL 93* 96*  CO2 23  --   GLUCOSE 326* 339*  BUN 23 31*  CREATININE 1.20* 1.10*  CALCIUM 8.8*  --    GFR: CrCl cannot be calculated (Unknown ideal weight.). Liver Function Tests: Recent Labs  Lab 01/08/18 2209  AST 30  ALT 36  ALKPHOS 76  BILITOT 0.6  PROT 7.5  ALBUMIN  3.3*   No results for input(s): LIPASE, AMYLASE in the last 168 hours. No results for input(s): AMMONIA in the last 168 hours. Coagulation Profile: No results for input(s): INR, PROTIME in the last 168 hours. Cardiac Enzymes: No results for input(s): CKTOTAL, CKMB, CKMBINDEX, TROPONINI in the last 168 hours. BNP (last 3 results) No results for input(s): PROBNP in the last 8760 hours. HbA1C: No results for input(s): HGBA1C in the last 72 hours. CBG: No results for input(s): GLUCAP in the last 168 hours. Lipid Profile: No results for input(s): CHOL, HDL, LDLCALC, TRIG, CHOLHDL, LDLDIRECT in the last 72 hours. Thyroid Function Tests: No results for input(s): TSH, T4TOTAL, FREET4, T3FREE, THYROIDAB in the last 72 hours. Anemia Panel: No results for input(s): VITAMINB12, FOLATE, FERRITIN, TIBC, IRON, RETICCTPCT in the last 72 hours. Urine analysis:    Component Value Date/Time   COLORURINE YELLOW 01/09/2018 0011   APPEARANCEUR CLEAR 01/09/2018 0011   LABSPEC 1.016 01/09/2018 0011   PHURINE 5.0 01/09/2018 0011   GLUCOSEU >=500 (A) 01/09/2018 0011   HGBUR MODERATE (A) 01/09/2018 0011   BILIRUBINUR NEGATIVE 01/09/2018 0011   KETONESUR NEGATIVE 01/09/2018 0011   PROTEINUR 30 (A) 01/09/2018 0011   NITRITE NEGATIVE  01/09/2018 0011   LEUKOCYTESUR NEGATIVE 01/09/2018 0011    Radiological Exams on Admission: Dg Abdomen 1 View  Result Date: 01/08/2018 CLINICAL DATA:  Shortness of breath EXAM: ABDOMEN - 1 VIEW COMPARISON:  None. FINDINGS: Scattered gas-filled small and large bowel without distention. Stomach is moderately gas distended. No radiopaque stones. Multiple calcified phleboliths in the pelvis. Vascular calcifications. Degenerative changes in the spine and hips. IMPRESSION: Nonobstructive bowel gas pattern. Moderate gaseous distention of the stomach may be due to dysmotility. Electronically Signed   By: Lucienne Capers M.D.   On: 01/08/2018 22:22   Dg Chest Portable 1 View  Result Date: 01/08/2018 CLINICAL DATA:  Shortness of breath EXAM: PORTABLE CHEST 1 VIEW COMPARISON:  08/06/2016 FINDINGS: Shallow inspiration with linear atelectasis in the left lung base. Cardiac enlargement with mild vascular congestion. No edema or consolidation. No blunting of costophrenic angles. No pneumothorax. Mediastinal contours appear intact. Tortuous aorta. Degenerative changes in the spine and shoulders. IMPRESSION: Cardiac enlargement with mild vascular congestion. No edema or consolidation. Atelectasis in the left lung base. Electronically Signed   By: Lucienne Capers M.D.   On: 01/08/2018 22:21    EKG: Independently reviewed.  Sinus tachycardia and noncontiguous ST elevations (lead III and V3).  Assessment/Plan Principal Problem:   Acute hypoxemic respiratory failure (HCC) Active Problems:   HTN (hypertension)   Hypothyroidism   Aspiration pneumonia (HCC)   AKI (acute kidney injury) ([place])   Hyponatremia   Acute hypoxemic respiratory failure -Etiology aspiration pneumonia versus acute exacerbation of chronic bronchitis -Tachycardic and tachypneic on arrival.  Had mild leukocytosis.  Chest x-ray showing mild vascular congestion but no edema or consolidation.  BNP not impressive.  Will check procalcitonin.  Continue ceftriaxone and metronidazole for coverage of aspiration pneumonia. -Will give scheduled duo nebs and prednisone as patient noted to be wheezing on exam -Troponin 0.14. However, EKG with noncontiguous ST elevations (lead III and V3).  Mild troponin elevation could possibly be due to demand ischemia.  Will continue to cycle troponin. -PE is also a possibility as patient was tachycardic and hypoxemic on arrival.  Check d-dimer. -Lactate mildly elevated at 2.2.  Will give IV fluid resuscitation and repeat lactate level.  Patient is afebrile and does not appear septic.  Blood pressure stable. -Blood  cx x 2 -Supplemental oxygen -Monitor in stepdown   Mild AKI Serum creatinine 1.2; baseline 0.9 a year ago.  Likely prerenal due to dehydration. -IV fluid resuscitation -Repeat BMP in a.m.  Mild hyponatremia Corrected sodium 131.  -Continue IV fluid -Repeat BMP in a.m.  Hypertension Blood pressure elevated on arrival and then improved without any intervention in the ED.  Patient is currently normotensive. -Hold home hydrochlorothiazide in the setting of mild AKI  Type 2 diabetes No recent A1c in chart. -Check A1c -Continue home Lantus 20 units at bedtime -Sliding scale insulin sensitive -CBG checks  Hypothyroidism -Continue home Synthroid  DVT prophylaxis: Lovenox Code Status: Partial code.  Okay to intubate and use pressors.  No chest compressions and no defibrillation.  Discussed with daughter at bedside. Family Communication: Daughter at bedside updated. Disposition Plan: Anticipate discharge in 1 to 2 days. Consults called: None Admission status: Inpatient.  Patient needs IV fluids and IV antibiotics.   Shela Leff MD [facility] Pager [phone]  If 7PM-7AM, please contact night-coverage [email or web address] Password [email or web address]  01/09/2018, 3:51 AM

## 2018-01-10 ENCOUNTER — Inpatient Hospital Stay (HOSPITAL_COMMUNITY)

## 2018-01-10 ENCOUNTER — Encounter (HOSPITAL_COMMUNITY): Payer: Self-pay

## 2018-01-10 DIAGNOSIS — I361 Nonrheumatic tricuspid (valve) insufficiency: Secondary | ICD-10-CM

## 2018-01-10 DIAGNOSIS — I340 Nonrheumatic mitral (valve) insufficiency: Secondary | ICD-10-CM

## 2018-01-10 LAB — BASIC METABOLIC PANEL
Anion gap: 10 (ref 5–15)
BUN: 29 mg/dL — AB (ref 8–23)
CALCIUM: 8.8 mg/dL — AB (ref 8.9–10.3)
CHLORIDE: 98 mmol/L (ref 98–111)
CO2: 25 mmol/L (ref 22–32)
CREATININE: 1.29 mg/dL — AB (ref 0.44–1.00)
GFR calc Af Amer: 44 mL/min — ABNORMAL LOW (ref >60)
GFR calc non Af Amer: 38 mL/min — ABNORMAL LOW (ref >60)
GLUCOSE: 254 mg/dL — AB (ref 70–99)
Potassium: 4.1 mmol/L (ref 3.5–5.1)
Sodium: 133 mmol/L — ABNORMAL LOW (ref 135–145)

## 2018-01-10 LAB — ECHOCARDIOGRAM COMPLETE: Weight: 3348.8 oz

## 2018-01-10 LAB — GLUCOSE, CAPILLARY
Glucose-Capillary: 184 mg/dL — ABNORMAL HIGH (ref 70–99)
Glucose-Capillary: 172 mg/dL — ABNORMAL HIGH (ref 70–99)
Glucose-Capillary: 196 mg/dL — ABNORMAL HIGH (ref 70–99)
Glucose-Capillary: 284 mg/dL — ABNORMAL HIGH (ref 70–99)

## 2018-01-10 MED ORDER — INSULIN ASPART 100 UNIT/ML ~~LOC~~ SOLN
0.0000 [IU] | Freq: Three times a day (TID) | SUBCUTANEOUS | Status: DC
  Administered 2018-01-10 (×2): 3 [IU] via SUBCUTANEOUS
  Administered 2018-01-10: 8 [IU] via SUBCUTANEOUS

## 2018-01-10 MED ORDER — PREDNISONE 5 MG PO TABS: ORAL_TABLET | ORAL | 1 refills | Status: DC

## 2018-01-10 MED ORDER — IPRATROPIUM-ALBUTEROL 0.5-2.5 (3) MG/3ML IN SOLN: 3.0000 mL | Freq: Three times a day (TID) | RESPIRATORY_TRACT | 3 refills | Status: DC | PRN

## 2018-01-10 MED ORDER — GUAIFENESIN ER 600 MG PO TB12: 600.0000 mg | ORAL_TABLET | Freq: Two times a day (BID) | ORAL | 0 refills | Status: AC

## 2018-01-10 MED ORDER — GUAIFENESIN ER 600 MG PO TB12: 600.0000 mg | ORAL_TABLET | Freq: Two times a day (BID) | ORAL | 0 refills | Status: DC

## 2018-01-10 MED ORDER — AMOXICILLIN-POT CLAVULANATE 875-125 MG PO TABS: 1.0000 | ORAL_TABLET | Freq: Two times a day (BID) | ORAL | 0 refills | Status: AC

## 2018-01-10 MED ORDER — PERFLUTREN LIPID MICROSPHERE
1.0000 mL | INTRAVENOUS | Status: AC | PRN
  Filled 2018-01-10: qty 10

## 2018-01-10 MED ORDER — DIVALPROEX SODIUM 125 MG PO CSDR: 250.0000 mg | DELAYED_RELEASE_CAPSULE | Freq: Two times a day (BID) | ORAL | 0 refills | Status: AC

## 2018-01-10 MED ORDER — ALBUTEROL SULFATE (2.5 MG/3ML) 0.083% IN NEBU: 5.0000 mg | INHALATION_SOLUTION | Freq: Two times a day (BID) | RESPIRATORY_TRACT | 12 refills | Status: AC | PRN

## 2018-01-10 MED FILL — AMOX-CLAV 875-125 MG TABLET: 875-125 | 7 days supply | Qty: 14 | Fill #0

## 2018-01-10 NOTE — Care Management Note (Addendum)
Case Management Note  Patient Details  Name: Candice Woodward MRN: [id number] Date of Birth: 09-17-1934  Subjective/Objective:  From home with hospice [facility], presents with acute hypoxic resp failure, DM2. She is for dc home today to resume with home hospice.  NCM spoke with daughter , Candice Woodward, she states she has no other needs.  NCM spoke with Victorino Dike the [facility] rep, informed her that patient is being discharged today by EMS, will plan for 4 pm per RN, patient is getting a 2 d echo right now.  Ambulance forms on chart.  ambulance pick up scheduled for 6 pm, so MD can get results of test.  NCM spoke with Eber Jones RN she will put dc summary in packet, since it is not available right now. NCM informed daughter Candice Woodward about transport time.                Action/Plan: DC home with hospice at 4 pm via EMS [phone].   Expected Discharge Date:  01/10/18               Expected Discharge Plan:  Home w Hospice Care  In-House Referral:     Discharge planning Services  CM Consult  Post Acute Care Choice:  Resumption of Svcs/PTA Provider Choice offered to:     DME Arranged:    DME Agency:     HH Arranged:    HH Agency:     Status of Service:  Completed, signed off  If discussed at [employer] of Stay Meetings, dates discussed:    Additional Comments:  Leone Haven, RN 01/10/2018, 2:44 PM

## 2018-01-10 NOTE — Progress Notes (Signed)
Patient was stable at discharge. I removed their IV. We reviewed the discharge education - with pt's daughter over phone - AVS and prescriptions given to EMTs.. Patient/Family verbalized understanding and had no further questions. Patient left with prescription/s in hand.

## 2018-01-10 NOTE — Discharge Instructions (Signed)
1) resume hospice services at home 2) we will give Augmentin antibiotic twice a day as prescribed for the next 7 days 3) okay to use albuterol and DuoNeb nebulizer treatments as prescribed for congestion and  Cough 4) aspiration precautions as discussed

## 2018-01-10 NOTE — Progress Notes (Signed)
  Echocardiogram 2D Echocardiogram has been performed.  Candice Woodward Cire Clute 01/10/2018, 3:28 PM

## 2018-01-10 NOTE — Progress Notes (Signed)
[facility] 2W-[facility]) GIP RN Visit at 1230  This is a related and covered GIP admission of 01/09/2018 with [facility] diagnosis of Alzheimer's disease per [facility] physician Dr. Barbee Shropshire. Patient has OOF DNR. Daughter called [facility] evening of 01/08/2018 when she noted her mother to have difficulty breathing. [facility] on-call RN advised daughter to call 911 and asked that [facility] evaluate patient and call [facility] when on scene. [facility] did not call [facility] prior to transfer to ED. Triage notes report EMS reported O2 sats at 80% on RA, pt was placed on CPAP and O2 increased to 95%. EMS also administered 125 mg solumedrol and 1 duoneb. Hypertension was also reported with systolic BP 210's that resolved without intervention.  Daughter reported to ED MD that patient had an aspiration like event while undergoing a Depends change. Pt is known to hold saliva in her mouth and when she was rolled onto her back she began choking. Pt has a history of similar presentation with admission due to aspiration pneumonia late last year.  Pt was admitted to the hospital with diagnosis of Acute hypoxemic respiratory failure. Etiology aspiration pneumonia versus acute exacerbation of chronic bronchitis.  [facility] Day 2 of GIP.  Continuous infusions: NS @ 100, zithromax 500mg  IV q 24h, rocephin 1g IV q24h, flagyl 500mg  IV q8h, received a liter bolus over night.  Visited with patient in room. Pt is non-verbal and does not open her eyes. She does not appear to be in any distress. Purewick catheter in place, draining clear amber urine.   Goals of Care: management of respiratory symptoms and comfort  Discharge Planning: daughter [name] anticipates pt to return home at time of discharge with [facility] to follow. Should ambulance transport be needed at time of discharge, please call [facility] at [phone] as [facility] contracts with [facility] for transportation needs.  Communication to [facility]: spoke with [name], she denies any  needs at this time  Communication to [facility]:  Completed  Should ambulance transportation be needed at time of discharge, please contact [facility] as they provide this service for [facility].  Please call with any hospice related questions or concerns.  Wallis Bamberg BSN, RN [facility] Liaison (listed in [place]

## 2018-01-10 NOTE — Discharge Summary (Signed)
Candice Woodward, is a 83 y.o. female  DOB January 31, 1935  MRN [id number].  Admission date:  01/08/2018  Admitting Physician  Shela Leff, MD  Discharge Date:  01/10/2018   Primary MD  Sheets, Elmon Else, FNP  Recommendations for primary care physician for things to follow:  1) resume hospice services at home 2) we will give Augmentin antibiotic twice a day as prescribed for the next 7 days 3) okay to use albuterol and DuoNeb nebulizer treatments as prescribed for congestion and  Cough 4) aspiration precautions as discussed   Admission Diagnosis  Aspiration pneumonia, unspecified aspiration pneumonia type, unspecified laterality, unspecified part of lung ([place]) [J69.0] Acute hypoxemic respiratory failure ([place]) [J96.01]   Discharge Diagnosis  Aspiration pneumonia, unspecified aspiration pneumonia type, unspecified laterality, unspecified part of lung ([place]) [J69.0] Acute hypoxemic respiratory failure ([place]) [J96.01]    Principal Problem:   Acute hypoxemic respiratory failure ([place]) Active Problems:   HTN (hypertension)   Hypothyroidism   Aspiration pneumonia ([place])   AKI (acute kidney injury) ([place])   Hyponatremia      Past Medical History:  Diagnosis Date  . Acute bronchitis   . Alzheimer disease ([place])   . Alzheimer's disease ([place])   . Anxiety state, unspecified   . Dementia in conditions classified elsewhere with behavioral disturbance   . Disorder of bone and cartilage, unspecified   . Dizziness and giddiness   . Hypertension   . IBS (irritable bowel syndrome)   . Irritable bowel syndrome   . Lumbago   . Malnutrition of mild degree ([place])   . Obsessive-compulsive disorders   . Other and unspecified hyperlipidemia   . Personal history of urinary (tract) infection   . Rickets, active   . Senile dementia, uncomplicated ([place])   . Thyroid disease     Past Surgical History:  Procedure  Laterality Date  . PARTIAL HYSTERECTOMY    . REPLACEMENT TOTAL KNEE     bilateral  . THYROID SURGERY         HPI  from the history and physical done on the day of admission:    PCP: Candice Lowenstein, FNP Patient coming from: Home  Chief Complaint: Acute respiratory distress  HPI: Candice Woodward is a 83 y.o. female with medical history significant of Alzheimer's dementia, on hospice, hypertension, diabetes presenting with acute respiratory distress.  Patient has Alzheimer's dementia and is nonverbal at baseline.  History was provided by daughter at bedside.  Daughter states that the patient was in her usual state of health until after dinner yesterday evening.  She had a bowel movement and while daughter was cleaning her up she noticed that the patient had a lot of saliva in her mouth and was sweating excessively.  Patient appeared scared and her eyes were wide open.  Daughter checked her blood pressure at that time and it was 213/134 and pulse 135.  CBG at home was 271.  Daughter states that the patient does not have asthma or COPD but does have chronic bronchitis for which  she takes albuterol as needed.  In addition, she has chronic allergies.  Daughter reports patient having prior history of aspiration pneumonia.    ED Course: SpO2 80% on RA when EMS arrived, placed on Cpap, O2 increased to 95%.  EMS gave 125 solumedrol, 1 duoneb.  Tachycardic, tachypneic, and hypertensive on arrival.  Blood pressure improved without any intervention.  She initially was placed on BiPAP in the ED and then eventually transitioned to 4 L of oxygen via nasal cannula.  White count 13.3.  BNP 219.  UA not suggestive of infection.  Troponin 0.14.  EKG showing sinus tachycardia and noncontiguous ST elevations (lead III and V3).  Chest x-ray showing cardiac enlargement with mild vascular congestion and atelectasis in the left lung base.  No edema or consolidation.  Abdominal x-ray showing nonobstructive bowel gas  pattern; moderate gaseous distention of the stomach likely due to dysmotility.   Hospital Course:      1)Apical LV thrombus appears fixed and extended in chronicity....Marland KitchenMarland KitchenEcho from 01/10/18 with a medium-sized, 2.2 cm (L) x 1.0 cm (W), laminated,   fixed, non mobile apical thrombus associated with a dyskinetic segment, d/w Dr Candee Furbish (cardiology), also d/w [name] and POA Ms Candice Woodward... Risk versus benefit of full anticoagulation discussed at this time decision has been made to avoid full anticoagulation due to risk versus benefit profile in the patient with very advanced dementia, bedbound status and overall poor prognosis currently under hospice care  2)Acute hypoxic respiratory failure--- CTA chest negative for PE, patient does have pulmonary hypertension, with findings of bronchitis, cannot exclude aspiration pneumonia, procalcitonin 0.40, lactic acid 2.2.  Hypoxia resolved, okay to discharge home on Augmentin 875 twice daily for 1 week as well as Mucinex and bronchodilators, may taper steroids  3)DM2- .  Poorly controlled, A1c 8.7, continue Lantus 20 units daily , avoid over aggressive glycemic control in the patient with overall poor prognosis, risk of life-threatening hypoglycemia with over aggressive diabetic control discussed with patient's daughter.   Diabetic educator input noted  4)Social/Ethics-no cpr, currently under hospice care,  Discharge Condition: Stable  Follow UP---     Hospice at home   Diet and Activity recommendation:  As advised  Discharge Instructions    Discharge Instructions    Bed rest   Complete by:  As directed    Call MD for:  extreme fatigue   Complete by:  As directed    Call MD for:  persistant dizziness or light-headedness   Complete by:  As directed    Call MD for:  persistant nausea and vomiting   Complete by:  As directed    Call MD for:  severe uncontrolled pain   Complete by:  As directed    Call MD for:  temperature >100.4    Complete by:  As directed    Diet Carb Modified   Complete by:  As directed    Discharge instructions   Complete by:  As directed    1) resume hospice services at home 2) we will give Augmentin antibiotic twice a day as prescribed for the next 7 days 3) okay to use albuterol and DuoNeb nebulizer treatments as prescribed for congestion and  Cough 4) aspiration precautions as discussed       Discharge Medications     Allergies as of 01/10/2018      Reactions   Lisinopril Swelling      Medication List    STOP taking these medications   hydrochlorothiazide 25  MG tablet Commonly known as:  HYDRODIURIL   sertraline 100 MG tablet Commonly known as:  ZOLOFT     TAKE these medications   albuterol (2.5 MG/3ML) 0.083% nebulizer solution Commonly known as:  PROVENTIL Take 6 mLs (5 mg total) by nebulization 2 (two) times daily as needed for wheezing or shortness of breath.   amoxicillin-clavulanate 875-125 MG tablet Commonly known as:  AUGMENTIN Take 1 tablet by mouth 2 (two) times daily for 7 days.   blood glucose meter kit and supplies Dispense based on patient and insurance preference. Use up to four times daily as directed. (FOR ICD-9 250.00, 250.01).   divalproex 125 MG capsule Commonly known as:  DEPAKOTE SPRINKLE Take 2 capsules (250 mg total) by mouth every 12 (twelve) hours.   guaiFENesin 600 MG 12 hr tablet Commonly known as:  MUCINEX Take 1 tablet (600 mg total) by mouth 2 (two) times daily for 10 days.   Insulin Glargine 100 UNIT/ML Solostar Pen Commonly known as:  LANTUS Inject 20 Units into the skin daily at 10 pm.   insulin glargine 100 UNIT/ML injection Commonly known as:  LANTUS Inject 20 units of Lantus every evening at 10 pm.   insulin lispro 100 UNIT/ML KiwkPen Commonly known as:  HUMALOG Inject 0.03 mLs (3 Units total) into the skin 3 (three) times daily.   Insulin Pen Needle 31G X 5 MM Misc Use with insulin pens 4 times daily     ipratropium-albuterol 0.5-2.5 (3) MG/3ML Soln Commonly known as:  DUONEB Inhale 3 mLs into the lungs every 8 (eight) hours as needed. For cough/shortness of breath/congestion What changed:    how much to take  reasons to take this  additional instructions   levothyroxine 100 MCG tablet Commonly known as:  SYNTHROID, LEVOTHROID TAKE 1 TABLET BY MOUTH FOR THYROID SUPPLEMENT What changed:  See the new instructions.   predniSONE 5 MG tablet Commonly known as:  DELTASONE Take 4 tablets [20 mg] daily for 4 days with food , then 2 tablets [10 mg] daily for 4 days, then back to 5 mg daily indefinitely What changed:    how much to take  how to take this  when to take this  additional instructions       Major procedures and Radiology Reports - PLEASE review detailed and final reports for all details, in brief -   Dg Abdomen 1 View  Result Date: 01/08/2018 CLINICAL DATA:  Shortness of breath EXAM: ABDOMEN - 1 VIEW COMPARISON:  None. FINDINGS: Scattered gas-filled small and large bowel without distention. Stomach is moderately gas distended. No radiopaque stones. Multiple calcified phleboliths in the pelvis. Vascular calcifications. Degenerative changes in the spine and hips. IMPRESSION: Nonobstructive bowel gas pattern. Moderate gaseous distention of the stomach may be due to dysmotility. Electronically Signed   By: Lucienne Capers M.D.   On: 01/08/2018 22:22   Ct Angio Chest Pe W Or Wo Contrast  Result Date: 01/09/2018 CLINICAL DATA:  Respiratory distress EXAM: CT ANGIOGRAPHY CHEST WITH CONTRAST TECHNIQUE: Multidetector CT imaging of the chest was performed using the standard protocol during bolus administration of intravenous contrast. Multiplanar CT image reconstructions and MIPs were obtained to evaluate the vascular anatomy. CONTRAST:  65 mL ISOVUE-370 IOPAMIDOL (ISOVUE-370) INJECTION 76%, COMPARISON:  Chest radiograph January 08, 2018 FINDINGS: Cardiovascular: There is no  demonstrable pulmonary embolus. There is no thoracic aortic aneurysm or dissection. The visualized great vessels appear unremarkable. There are foci of coronary artery calcification. There is no pericardial effusion  or pericardial thickening. The main pulmonary outflow tract measures 3.4 cm, prominent. Mediastinum/Nodes: Thyroid is absent. There are subcentimeter mediastinal lymph nodes present. By size criteria, there is no evident adenopathy. No esophageal lesions are evident. Lungs/Pleura: There is peribronchial thickening bilaterally. There is atelectatic change bilaterally. There are multiple areas of somewhat mosaic attenuation with ground-glass type opacity bilaterally, most notably in the upper lobe regions. No pleural effusion or pleural thickening evident. Upper Abdomen: Visualized upper abdominal structures appear normal. Musculoskeletal: There is degenerative change in the thoracic spine. Degenerative changes noted in the sternoclavicular joint regions. There are no blastic or lytic bone lesions. No evident chest wall lesions. Review of the MIP images confirms the above findings. IMPRESSION: 1. No demonstrable pulmonary embolus. No thoracic aortic aneurysm or dissection. There are foci of coronary artery calcification. 2. Prominence of the main pulmonary outflow tract, a finding felt to be indicative of pulmonary arterial hypertension. 3. Central peribronchial thickening bilaterally, likely due to chronic bronchitis. Areas of patchy atelectasis bilaterally. Somewhat mosaic attenuation throughout much of the lungs, particular in the upper lobes. Suspect underlying small airways obstructive disease. A degree of developing pneumonia in the upper lobes cannot be entirely excluded. No pleural effusion evident. 4.  No demonstrable thoracic adenopathy. Electronically Signed   By: Lowella Grip III M.D.   On: 01/09/2018 08:18   Dg Chest Portable 1 View  Result Date: 01/08/2018 CLINICAL DATA:  Shortness  of breath EXAM: PORTABLE CHEST 1 VIEW COMPARISON:  08/06/2016 FINDINGS: Shallow inspiration with linear atelectasis in the left lung base. Cardiac enlargement with mild vascular congestion. No edema or consolidation. No blunting of costophrenic angles. No pneumothorax. Mediastinal contours appear intact. Tortuous aorta. Degenerative changes in the spine and shoulders. IMPRESSION: Cardiac enlargement with mild vascular congestion. No edema or consolidation. Atelectasis in the left lung base. Electronically Signed   By: Lucienne Capers M.D.   On: 01/08/2018 22:21    Micro Results   Recent Results (from the past 240 hour(s))  Culture, blood (routine x 2)     Status: None (Preliminary result)   Collection Time: 01/08/18 11:40 PM  Result Value Ref Range Status   Specimen Description BLOOD RIGHT FOREARM  Final   Special Requests   Final    BOTTLES DRAWN AEROBIC AND ANAEROBIC Blood Culture adequate volume   Culture   Final    NO GROWTH 1 DAY Performed at [facility], [place] [address] [place], [place] [address]    Report Status PENDING  Incomplete  Culture, blood (routine x 2)     Status: None (Preliminary result)   Collection Time: 01/08/18 11:47 PM  Result Value Ref Range Status   Specimen Description BLOOD RIGHT WRIST  Final   Special Requests   Final    BOTTLES DRAWN AEROBIC AND ANAEROBIC Blood Culture results may not be optimal due to an inadequate volume of blood received in culture bottles   Culture   Final    NO GROWTH 1 DAY Performed at [facility], [place] [address], [place], [place] [address]    Report Status PENDING  Incomplete  Urine culture     Status: Abnormal (Preliminary result)   Collection Time: 01/09/18 12:11 AM  Result Value Ref Range Status   Specimen Description URINE, CATHETERIZED  Final   Special Requests   Final    NONE Performed at [facility], [place] [address], [place], [place] [address]    Culture 20,000 COLONIES/mL ENTEROCOCCUS FAECALIS (A)  Final   Report Status PENDING  Incomplete  MRSA PCR Screening     Status: None   Collection Time: 01/09/18  4:20 AM  Result Value Ref Range Status   MRSA by PCR NEGATIVE NEGATIVE Final    Comment:        The GeneXpert MRSA Assay (FDA approved for NASAL specimens only), is one component of a comprehensive MRSA colonization surveillance program. It is not intended to diagnose MRSA infection nor to guide or monitor treatment for MRSA infections. Performed at [facility], [place] [address], [place], [place] [address]    Today   Subjective    Davetta Olliff today has no new complaints,, daughter at bedside, questions answered          Patient has been seen and examined prior to discharge   Objective   Blood pressure 125/66, pulse 89, temperature 98.4 F (36.9 C), temperature source Oral, resp. rate (!) 28, weight 94.9 kg, SpO2 95 %.   Intake/Output Summary (Last 24 hours) at 01/10/2018 1405 Last data filed at 01/10/2018 0900 Gross per 24 hour  Intake 640.33 ml  Output 300 ml  Net 340.33 ml    Exam Gen:- Awake Alert, resting comfortably, nonverbal HEENT:- [place].AT, No sclera icterus Neck-Supple Neck,No JVD,.  Lungs-improving air movement, no wheezing CV- S1, S2 normal, regular Abd-  +ve B.Sounds, Abd Soft, No tenderness,    Extremity/Skin:-Warm and dry,   good pulses  psych-significant cognitive deficits consistent with very advanced dementia, patient is nonverbal  neuro-bedbound   Data Review   CBC w Diff:  Lab Results  Component Value Date   WBC 14.9 (H) 01/09/2018   HGB 12.1 01/09/2018   HCT 36.8 01/09/2018   PLT 244 01/09/2018   LYMPHOPCT 11 01/08/2018   MONOPCT 4 01/08/2018   EOSPCT 1 01/08/2018   BASOPCT 0 01/08/2018    CMP:  Lab Results  Component Value Date   NA 133 (L) 01/10/2018   NA 144 02/12/2014   K 4.1 01/10/2018   CL 98 01/10/2018   CO2 25 01/10/2018   BUN 29 (H) 01/10/2018   BUN 25 02/12/2014   CREATININE 1.29 (H) 01/10/2018    PROT 7.5 01/08/2018   PROT 6.7 08/08/2013   ALBUMIN 3.3 (L) 01/08/2018   ALBUMIN 3.9 08/08/2013   BILITOT 0.6 01/08/2018   ALKPHOS 76 01/08/2018   AST 30 01/08/2018   ALT 36 01/08/2018  .   Total Discharge time is about 33 minutes  Roxan Hockey M.D on 01/10/2018 at 2:05 PM  Pager---[phone]  Go to [email or web address] - password [email or web address] for contact info  [facility] - Office  [phone]

## 2018-01-11 LAB — URINE CULTURE

## 2018-01-15 LAB — CULTURE, BLOOD (ROUTINE X 2)
Culture: NO GROWTH
Culture: NO GROWTH
SPECIAL REQUESTS: ADEQUATE

## 2018-02-26 ENCOUNTER — Inpatient Hospital Stay (HOSPITAL_COMMUNITY)
Admission: EM | Admit: 2018-02-26 | Discharge: 2018-03-02 | DRG: 177 | Disposition: A | Discharge status: Hospice | Attending: Internal Medicine | Admitting: Internal Medicine

## 2018-02-26 DIAGNOSIS — E1165 Type 2 diabetes mellitus with hyperglycemia: Secondary | ICD-10-CM | POA: Diagnosis present

## 2018-02-26 DIAGNOSIS — E872 Acidosis: Secondary | ICD-10-CM | POA: Diagnosis present

## 2018-02-26 DIAGNOSIS — Z66 Do not resuscitate: Secondary | ICD-10-CM

## 2018-02-26 DIAGNOSIS — N179 Acute kidney failure, unspecified: Secondary | ICD-10-CM | POA: Diagnosis present

## 2018-02-26 DIAGNOSIS — R1310 Dysphagia, unspecified: Secondary | ICD-10-CM | POA: Diagnosis present

## 2018-02-26 DIAGNOSIS — J9601 Acute respiratory failure with hypoxia: Secondary | ICD-10-CM | POA: Diagnosis present

## 2018-02-26 DIAGNOSIS — Z888 Allergy status to other drugs, medicaments and biological substances status: Secondary | ICD-10-CM

## 2018-02-26 DIAGNOSIS — Z515 Encounter for palliative care: Secondary | ICD-10-CM | POA: Diagnosis present

## 2018-02-26 DIAGNOSIS — N183 Chronic kidney disease, stage 3 (moderate): Secondary | ICD-10-CM | POA: Diagnosis present

## 2018-02-26 DIAGNOSIS — F0280 Dementia in other diseases classified elsewhere without behavioral disturbance: Secondary | ICD-10-CM | POA: Diagnosis present

## 2018-02-26 DIAGNOSIS — Z8701 Personal history of pneumonia (recurrent): Secondary | ICD-10-CM

## 2018-02-26 DIAGNOSIS — I129 Hypertensive chronic kidney disease with stage 1 through stage 4 chronic kidney disease, or unspecified chronic kidney disease: Secondary | ICD-10-CM | POA: Diagnosis present

## 2018-02-26 DIAGNOSIS — J690 Pneumonitis due to inhalation of food and vomit: Secondary | ICD-10-CM | POA: Diagnosis not present

## 2018-02-26 DIAGNOSIS — R569 Unspecified convulsions: Secondary | ICD-10-CM

## 2018-02-26 DIAGNOSIS — E860 Dehydration: Secondary | ICD-10-CM | POA: Diagnosis present

## 2018-02-26 DIAGNOSIS — E039 Hypothyroidism, unspecified: Secondary | ICD-10-CM | POA: Diagnosis present

## 2018-02-26 DIAGNOSIS — E785 Hyperlipidemia, unspecified: Secondary | ICD-10-CM | POA: Diagnosis present

## 2018-02-26 DIAGNOSIS — Z7401 Bed confinement status: Secondary | ICD-10-CM

## 2018-02-26 DIAGNOSIS — Z720 Tobacco use: Secondary | ICD-10-CM

## 2018-02-26 DIAGNOSIS — E1122 Type 2 diabetes mellitus with diabetic chronic kidney disease: Secondary | ICD-10-CM | POA: Diagnosis present

## 2018-02-26 DIAGNOSIS — Z79899 Other long term (current) drug therapy: Secondary | ICD-10-CM

## 2018-02-26 DIAGNOSIS — G309 Alzheimer's disease, unspecified: Secondary | ICD-10-CM | POA: Diagnosis present

## 2018-02-26 DIAGNOSIS — Z794 Long term (current) use of insulin: Secondary | ICD-10-CM

## 2018-02-26 DIAGNOSIS — G9341 Metabolic encephalopathy: Secondary | ICD-10-CM | POA: Diagnosis present

## 2018-02-26 DIAGNOSIS — Z7951 Long term (current) use of inhaled steroids: Secondary | ICD-10-CM

## 2018-02-26 DIAGNOSIS — E871 Hypo-osmolality and hyponatremia: Secondary | ICD-10-CM | POA: Diagnosis present

## 2018-02-27 ENCOUNTER — Emergency Department (HOSPITAL_COMMUNITY)

## 2018-02-27 ENCOUNTER — Other Ambulatory Visit: Payer: Self-pay

## 2018-02-27 ENCOUNTER — Encounter (HOSPITAL_COMMUNITY): Payer: Self-pay | Admitting: *Deleted

## 2018-02-27 DIAGNOSIS — G309 Alzheimer's disease, unspecified: Secondary | ICD-10-CM | POA: Diagnosis present

## 2018-02-27 DIAGNOSIS — E872 Acidosis, unspecified: Secondary | ICD-10-CM | POA: Insufficient documentation

## 2018-02-27 DIAGNOSIS — J9601 Acute respiratory failure with hypoxia: Secondary | ICD-10-CM

## 2018-02-27 DIAGNOSIS — E1165 Type 2 diabetes mellitus with hyperglycemia: Secondary | ICD-10-CM | POA: Diagnosis present

## 2018-02-27 DIAGNOSIS — E871 Hypo-osmolality and hyponatremia: Secondary | ICD-10-CM | POA: Diagnosis not present

## 2018-02-27 DIAGNOSIS — E1122 Type 2 diabetes mellitus with diabetic chronic kidney disease: Secondary | ICD-10-CM | POA: Diagnosis present

## 2018-02-27 DIAGNOSIS — Z79899 Other long term (current) drug therapy: Secondary | ICD-10-CM | POA: Diagnosis not present

## 2018-02-27 DIAGNOSIS — J690 Pneumonitis due to inhalation of food and vomit: Principal | ICD-10-CM

## 2018-02-27 DIAGNOSIS — G9341 Metabolic encephalopathy: Secondary | ICD-10-CM | POA: Diagnosis present

## 2018-02-27 DIAGNOSIS — E785 Hyperlipidemia, unspecified: Secondary | ICD-10-CM | POA: Diagnosis present

## 2018-02-27 DIAGNOSIS — Z888 Allergy status to other drugs, medicaments and biological substances status: Secondary | ICD-10-CM | POA: Diagnosis not present

## 2018-02-27 DIAGNOSIS — R1310 Dysphagia, unspecified: Secondary | ICD-10-CM | POA: Diagnosis present

## 2018-02-27 DIAGNOSIS — N179 Acute kidney failure, unspecified: Secondary | ICD-10-CM | POA: Diagnosis present

## 2018-02-27 DIAGNOSIS — E039 Hypothyroidism, unspecified: Secondary | ICD-10-CM | POA: Diagnosis present

## 2018-02-27 DIAGNOSIS — R569 Unspecified convulsions: Secondary | ICD-10-CM | POA: Diagnosis not present

## 2018-02-27 DIAGNOSIS — I129 Hypertensive chronic kidney disease with stage 1 through stage 4 chronic kidney disease, or unspecified chronic kidney disease: Secondary | ICD-10-CM | POA: Diagnosis present

## 2018-02-27 DIAGNOSIS — F0280 Dementia in other diseases classified elsewhere without behavioral disturbance: Secondary | ICD-10-CM | POA: Diagnosis present

## 2018-02-27 DIAGNOSIS — Z794 Long term (current) use of insulin: Secondary | ICD-10-CM | POA: Diagnosis not present

## 2018-02-27 DIAGNOSIS — Z515 Encounter for palliative care: Secondary | ICD-10-CM | POA: Diagnosis present

## 2018-02-27 DIAGNOSIS — Z7401 Bed confinement status: Secondary | ICD-10-CM | POA: Diagnosis not present

## 2018-02-27 DIAGNOSIS — Z7951 Long term (current) use of inhaled steroids: Secondary | ICD-10-CM | POA: Diagnosis not present

## 2018-02-27 DIAGNOSIS — Z7189 Other specified counseling: Secondary | ICD-10-CM | POA: Diagnosis not present

## 2018-02-27 DIAGNOSIS — Z8701 Personal history of pneumonia (recurrent): Secondary | ICD-10-CM | POA: Diagnosis not present

## 2018-02-27 DIAGNOSIS — Z66 Do not resuscitate: Secondary | ICD-10-CM | POA: Diagnosis present

## 2018-02-27 DIAGNOSIS — N183 Chronic kidney disease, stage 3 (moderate): Secondary | ICD-10-CM | POA: Diagnosis present

## 2018-02-27 DIAGNOSIS — Z720 Tobacco use: Secondary | ICD-10-CM | POA: Diagnosis not present

## 2018-02-27 LAB — BLOOD CULTURE ID PANEL (REFLEXED)
Acinetobacter baumannii: NOT DETECTED
CANDIDA ALBICANS: NOT DETECTED
CANDIDA PARAPSILOSIS: NOT DETECTED
CANDIDA TROPICALIS: NOT DETECTED
Candida glabrata: NOT DETECTED
Candida krusei: NOT DETECTED
Enterobacter cloacae complex: NOT DETECTED
Enterobacteriaceae species: NOT DETECTED
Enterococcus species: NOT DETECTED
Escherichia coli: NOT DETECTED
HAEMOPHILUS INFLUENZAE: NOT DETECTED
KLEBSIELLA OXYTOCA: NOT DETECTED
KLEBSIELLA PNEUMONIAE: NOT DETECTED
Listeria monocytogenes: NOT DETECTED
METHICILLIN RESISTANCE: DETECTED — AB
Neisseria meningitidis: NOT DETECTED
Proteus species: NOT DETECTED
Pseudomonas aeruginosa: NOT DETECTED
SERRATIA MARCESCENS: NOT DETECTED
STREPTOCOCCUS PNEUMONIAE: NOT DETECTED
STREPTOCOCCUS PYOGENES: NOT DETECTED
Staphylococcus aureus (BCID): NOT DETECTED
Staphylococcus species: DETECTED — AB
Streptococcus agalactiae: NOT DETECTED
Streptococcus species: NOT DETECTED

## 2018-02-27 LAB — CBC WITH DIFFERENTIAL/PLATELET
Abs Immature Granulocytes: 0.6 10*3/uL — ABNORMAL HIGH (ref 0.00–0.07)
Basophils Absolute: 0.1 10*3/uL (ref 0.0–0.1)
Basophils Relative: 1 %
EOS PCT: 1 %
Eosinophils Absolute: 0.1 10*3/uL (ref 0.0–0.5)
HEMATOCRIT: 41.9 % (ref 36.0–46.0)
HEMOGLOBIN: 13.6 g/dL (ref 12.0–15.0)
IMMATURE GRANULOCYTES: 4 %
LYMPHS ABS: 2.3 10*3/uL (ref 0.7–4.0)
LYMPHS PCT: 16 %
MCH: 28 pg (ref 26.0–34.0)
MCHC: 32.5 g/dL (ref 30.0–36.0)
MCV: 86.4 fL (ref 80.0–100.0)
Monocytes Absolute: 0.8 10*3/uL (ref 0.1–1.0)
Monocytes Relative: 6 %
NRBC: 0 % (ref 0.0–0.2)
Neutro Abs: 10.2 10*3/uL — ABNORMAL HIGH (ref 1.7–7.7)
Neutrophils Relative %: 72 %
Platelets: 284 10*3/uL (ref 150–400)
RBC: 4.85 MIL/uL (ref 3.87–5.11)
RDW: 14.3 % (ref 11.5–15.5)
WBC: 14 10*3/uL — ABNORMAL HIGH (ref 4.0–10.5)

## 2018-02-27 LAB — I-STAT CG4 LACTIC ACID, ED
LACTIC ACID, VENOUS: 4.07 mmol/L — AB (ref 0.5–1.9)
Lactic Acid, Venous: 6.67 mmol/L (ref 0.5–1.9)

## 2018-02-27 LAB — COMPREHENSIVE METABOLIC PANEL
ALBUMIN: 3.2 g/dL — AB (ref 3.5–5.0)
ALK PHOS: 76 U/L (ref 38–126)
ALT: 30 U/L (ref 0–44)
AST: 34 U/L (ref 15–41)
Anion gap: 17 — ABNORMAL HIGH (ref 5–15)
BUN: 14 mg/dL (ref 8–23)
CO2: 18 mmol/L — ABNORMAL LOW (ref 22–32)
CREATININE: 1.13 mg/dL — AB (ref 0.44–1.00)
Calcium: 8.4 mg/dL — ABNORMAL LOW (ref 8.9–10.3)
Chloride: 85 mmol/L — ABNORMAL LOW (ref 98–111)
GFR calc Af Amer: 53 mL/min — ABNORMAL LOW (ref >60)
GFR calc non Af Amer: 46 mL/min — ABNORMAL LOW (ref >60)
Glucose, Bld: 315 mg/dL — ABNORMAL HIGH (ref 70–99)
Potassium: 4.3 mmol/L (ref 3.5–5.1)
SODIUM: 120 mmol/L — AB (ref 135–145)
Total Bilirubin: 0.5 mg/dL (ref 0.3–1.2)
Total Protein: 7.4 g/dL (ref 6.5–8.1)

## 2018-02-27 LAB — I-STAT ARTERIAL BLOOD GAS, ED
Acid-base deficit: 1 mmol/L (ref 0.0–2.0)
BICARBONATE: 25.6 mmol/L (ref 20.0–28.0)
O2 Saturation: 96 %
PCO2 ART: 45.9 mmHg (ref 32.0–48.0)
Patient temperature: 97.9
TCO2: 27 mmol/L (ref 22–32)
pH, Arterial: 7.354 (ref 7.350–7.450)
pO2, Arterial: 81 mmHg — ABNORMAL LOW (ref 83.0–108.0)

## 2018-02-27 LAB — URINALYSIS, ROUTINE W REFLEX MICROSCOPIC
Bilirubin Urine: NEGATIVE
Glucose, UA: >=500 mg/dL — AB
Ketones, ur: NEGATIVE mg/dL
Leukocytes, UA: NEGATIVE
Nitrite: NEGATIVE
PH: 5 (ref 5.0–8.0)
Protein, ur: NEGATIVE mg/dL
Specific Gravity, Urine: 1.015 (ref 1.005–1.030)

## 2018-02-27 LAB — GLUCOSE, CAPILLARY
GLUCOSE-CAPILLARY: 192 mg/dL — AB (ref 70–99)
GLUCOSE-CAPILLARY: 165 mg/dL — AB (ref 70–99)
GLUCOSE-CAPILLARY: 163 mg/dL — AB (ref 70–99)
Glucose-Capillary: 126 mg/dL — ABNORMAL HIGH (ref 70–99)
Glucose-Capillary: 119 mg/dL — ABNORMAL HIGH (ref 70–99)

## 2018-02-27 LAB — APTT: APTT: 30 s (ref 24–36)

## 2018-02-27 LAB — CBG MONITORING, ED
GLUCOSE-CAPILLARY: 323 mg/dL — AB (ref 70–99)
Glucose-Capillary: 257 mg/dL — ABNORMAL HIGH (ref 70–99)
Glucose-Capillary: 243 mg/dL — ABNORMAL HIGH (ref 70–99)

## 2018-02-27 LAB — TROPONIN I: Troponin I: 0.03 ng/mL (ref <0.03)

## 2018-02-27 LAB — PROTIME-INR
INR: 1.05
PROTHROMBIN TIME: 13.6 s (ref 11.4–15.2)

## 2018-02-27 LAB — MRSA PCR SCREENING: MRSA BY PCR: NEGATIVE

## 2018-02-27 MED ORDER — ENOXAPARIN SODIUM 40 MG/0.4ML ~~LOC~~ SOLN: 40.0000 mg | Freq: Every day | SUBCUTANEOUS | Status: DC

## 2018-02-27 MED ORDER — ENOXAPARIN SODIUM 40 MG/0.4ML ~~LOC~~ SOLN
40.0000 mg | Freq: Every day | SUBCUTANEOUS | Status: DC
  Administered 2018-02-27 – 2018-03-02 (×4): 40 mg via SUBCUTANEOUS
  Filled 2018-02-27 (×5): qty 0.4

## 2018-02-27 MED ORDER — VANCOMYCIN HCL IN DEXTROSE 1-5 GM/200ML-% IV SOLN: 1000.0000 mg | Freq: Once | INTRAVENOUS | Status: DC

## 2018-02-27 MED ORDER — INSULIN ASPART 100 UNIT/ML ~~LOC~~ SOLN
0.0000 [IU] | SUBCUTANEOUS | Status: DC
  Administered 2018-02-27: 2 [IU] via SUBCUTANEOUS
  Administered 2018-02-27: 5 [IU] via SUBCUTANEOUS
  Administered 2018-02-27: 1 [IU] via SUBCUTANEOUS
  Administered 2018-02-27: 2 [IU] via SUBCUTANEOUS
  Administered 2018-02-27 – 2018-02-28 (×2): 3 [IU] via SUBCUTANEOUS
  Administered 2018-02-28 (×2): 2 [IU] via SUBCUTANEOUS
  Administered 2018-02-28: 3 [IU] via SUBCUTANEOUS
  Administered 2018-02-28 – 2018-03-01 (×3): 2 [IU] via SUBCUTANEOUS
  Administered 2018-03-01: 3 [IU] via SUBCUTANEOUS
  Administered 2018-03-01: 1 [IU] via SUBCUTANEOUS
  Administered 2018-03-01 – 2018-03-02 (×3): 3 [IU] via SUBCUTANEOUS
  Administered 2018-03-02: 2 [IU] via SUBCUTANEOUS
  Administered 2018-03-02: 3 [IU] via SUBCUTANEOUS
  Administered 2018-03-02: 2 [IU] via SUBCUTANEOUS
  Administered 2018-03-02: 1 [IU] via SUBCUTANEOUS

## 2018-02-27 MED ORDER — VALPROATE SODIUM 500 MG/5ML IV SOLN
250.0000 mg | Freq: Four times a day (QID) | INTRAVENOUS | Status: DC
  Administered 2018-02-27 – 2018-02-28 (×6): 250 mg via INTRAVENOUS
  Filled 2018-02-27 (×9): qty 2.5

## 2018-02-27 MED ORDER — SODIUM CHLORIDE 0.9 % IV BOLUS
500.0000 mL | Freq: Once | INTRAVENOUS | Status: AC | PRN
  Administered 2018-02-27: 500 mL via INTRAVENOUS

## 2018-02-27 MED ORDER — LACTATED RINGERS IV BOLUS
500.0000 mL | Freq: Once | INTRAVENOUS | Status: AC | PRN
  Administered 2018-02-27: 500 mL via INTRAVENOUS

## 2018-02-27 MED ORDER — LACTATED RINGERS IV SOLN
INTRAVENOUS | Status: DC
  Administered 2018-02-27 – 2018-02-28 (×3): via INTRAVENOUS

## 2018-02-27 MED ORDER — IPRATROPIUM-ALBUTEROL 0.5-2.5 (3) MG/3ML IN SOLN
3.0000 mL | RESPIRATORY_TRACT | Status: DC
  Administered 2018-02-27 – 2018-03-02 (×17): 3 mL via RESPIRATORY_TRACT
  Filled 2018-02-27 (×18): qty 3

## 2018-02-27 MED ORDER — LACTATED RINGERS IV BOLUS: 500.0000 mL | Freq: Once | INTRAVENOUS | Status: DC | PRN

## 2018-02-27 MED ORDER — VANCOMYCIN HCL IN DEXTROSE 1-5 GM/200ML-% IV SOLN
1000.0000 mg | INTRAVENOUS | Status: DC
  Administered 2018-02-28: 1000 mg via INTRAVENOUS
  Filled 2018-02-27: qty 200

## 2018-02-27 MED ORDER — SODIUM CHLORIDE 0.9 % IV SOLN
2.0000 g | Freq: Once | INTRAVENOUS | Status: AC
  Administered 2018-02-27: 2 g via INTRAVENOUS
  Filled 2018-02-27: qty 2

## 2018-02-27 MED ORDER — PIPERACILLIN-TAZOBACTAM 3.375 G IVPB
3.3750 g | Freq: Three times a day (TID) | INTRAVENOUS | Status: DC
  Administered 2018-02-27 – 2018-03-02 (×10): 3.375 g via INTRAVENOUS
  Filled 2018-02-27 (×12): qty 50

## 2018-02-27 MED ORDER — SODIUM CHLORIDE 0.9 % IV BOLUS
500.0000 mL | Freq: Once | INTRAVENOUS | Status: AC
  Administered 2018-02-27: 500 mL via INTRAVENOUS

## 2018-02-27 MED ORDER — VANCOMYCIN HCL 10 G IV SOLR
1500.0000 mg | Freq: Once | INTRAVENOUS | Status: AC
  Administered 2018-02-27: 1500 mg via INTRAVENOUS
  Filled 2018-02-27: qty 1500

## 2018-02-27 MED ORDER — INSULIN ASPART 100 UNIT/ML ~~LOC~~ SOLN: 1.0000 [IU] | SUBCUTANEOUS | Status: DC

## 2018-02-27 NOTE — ED Triage Notes (Signed)
Per the patient daughter the pt had just finished eating and she thought the patient may be aspirating. She said the patient was lying down and she was assisting her to have a BM when started shaking her head, her tongue was blue and she called ems. She appeared to have difficulty breathing and gave her duo neb. At baseline not able to talk or follow commands per the daughter.

## 2018-02-27 NOTE — ED Notes (Signed)
Pt daughter reports she did not give patient lantus dose tonight.

## 2018-02-27 NOTE — ED Notes (Signed)
ED Provider at bedside to speak with family 

## 2018-02-27 NOTE — ED Notes (Signed)
Pharmacy contacted this RN regarding depacon, pharmacist stated they were holding med until patient got a room, this RN advised pharmacy that it is unknown when patient may get a SDU bed so med needs to be sent to ED if patient is going to be due for it soon.

## 2018-02-27 NOTE — Progress Notes (Signed)
Pharmacy Antibiotic Note  Deanna ArtisLois B Cid is a 83 y.o. female admitted on 02/26/2018 with SOB/PNA.  Pharmacy has been consulted for Vancomycin and Zosyn  Dosing.  Vancomycin 1 g IV given in ED at 0145  Plan: Vancomycin 1 g IV q24h Zosyn 3.375 g IV q8h   Height: 5\' 5"  (165.1 cm) Weight: 209 lb 7 oz (95 kg) IBW/kg (Calculated) : 57  Temp (24hrs), Avg:97.2 F (36.2 C), Min:97.2 F (36.2 C), Max:97.2 F (36.2 C)  Recent Labs  Lab 02/27/18 0011 02/27/18 0018  WBC 14.0*  --   CREATININE 1.13*  --   LATICACIDVEN  --  6.67*    Estimated Creatinine Clearance: 45.3 mL/min (A) (by C-G formula based on SCr of 1.13 mg/dL (H)).    Allergies  Allergen Reactions  . Lisinopril Swelling    Eddie Candlebbott, Atley Neubert Vernon 02/27/2018 2:52 AM

## 2018-02-27 NOTE — Progress Notes (Signed)
[facility] ([facility]) GIP RN visit.  This is a related and covered GIP admission of 02/27/2018 with [facility] diagnosis of Alzheimer's disease per Dr. Barbee ShropshireHertweck. Patient has OOF DNR in home.  Family activated EMS after patient appeared to have aspirated and become unresponsive. On arrival by fire the patient oxygen saturation was in the 30's. She was repositioned, placed on NRB with improved saturations. Patient was admitted for aspiration pneumonia.   Day 1 of [facility] GIP  Visited with patient at bedside, daughter denise was present. Pt did not respond to voice, and is nonverbal.  She does not follow commands. She does however respond to her daughter [name] when comforted by her. Patient did not appear in distress.   Medications: Lactataed ringers 8050ml/hr, Zosyn 3.375g IVPB TID, Depacon 250mg  IVPB QID, Vancocin 1000mg  IVPB QD.  Per MD notes: Plan -Patient received cefepime and vancomycin in the emergency room I will continue with vancomycin and add Zosyn -Cultures -DuoNeb every 4 hours -Aggressive pulmonary hygiene -Hold off on any more IV fluid boluses and decrease IV fluid to 50 mill in hour of LR -Insulin sliding scale -Change Depakote to IV -Start levothyroxine IV if patient mental status does not improve or insert nasogastric tube -DVT prophylaxis -Palliative care consult in the morning -Since patient is not able to provide any history or speak for herself I had a long discussion with the daughter about goals of care patient is on hospice at home but she wants to continue aggressive care we discussed overall guarded prognosis with potential poor prognosis if patient respiratory status worsens daughter agreed about DNR with no intubation no resuscitation.  Goals of Care: Daughter wants patient to return home with hospice. Daughter does want treatment for condition but does not want intubation or resuscitation.  Communication with [facility]: Spoke with daughter at  bedside.  Communication with [facility]: Team updated on admission.   Please use [facility] if ambulance transport is needed at discharge. Please call with any hospice related questions or concerns.  Thank you, Charlynn CourtMary Anne Robertson,RN, CCM [facility] Hospital Liaison (listed on AMION)  [phone]

## 2018-02-27 NOTE — Progress Notes (Signed)
PHARMACY - PHYSICIAN COMMUNICATION CRITICAL VALUE ALERT - BLOOD CULTURE IDENTIFICATION (BCID)  Candice ArtisLois B Michaux is an 83 y.o. female [facility] with a chief complaint of SOB.   Assessment: Currently on day #1 of vancomycin/zosyn for sepsis/PNA. 1 of 2 BCx growing GPC in clusters, BCID detected coag negative staph with methicillin resistance - possible contaminant.  Name of physician (or Provider) Contacted: Paged Elink  Current antibiotics: vancomycin/zosyn  Changes to prescribed antibiotics recommended:  None - continuing on antibiotics for PNA currently   Babs BertinHaley Dakotah Heiman, PharmD, BCPS Please check AMION for all [facility] contact numbers Clinical Pharmacist 02/27/2018 9:46 PM

## 2018-02-27 NOTE — ED Notes (Signed)
[name] at bedside provided coffee

## 2018-02-27 NOTE — ED Triage Notes (Signed)
Pt arrives via EMS. Per their report, the initial call was for CPR then changed to unresponsive. On arrival by fire the patient oxygen saturation was in the 30's. She was repositioned, placed on NRB with improved saturations. Per family the pt has sever alzheimer's and is non verbal, however, tonight appears to be altered. Pressure 230/120 manual, hr 116, cbg 305, saturations >90 on NRB. Pt requiring frequent suctioning en route, with copious secretions. She arrives with DNR in place.

## 2018-02-27 NOTE — ED Notes (Signed)
Provider at bedside

## 2018-02-27 NOTE — ED Notes (Signed)
Patient transported to CT 

## 2018-02-27 NOTE — ED Provider Notes (Addendum)
[facility] EMERGENCY DEPARTMENT Provider Note   CSN: [id number] Arrival date & time: 02/26/18  2359     History   Chief Complaint Chief Complaint  Patient presents with  . Altered Mental Status    HPI Candice Woodward is a 83 y.o. female.  HPI  83 y.o.femalewith medical history significant of end-stage Alzheimer, hypertension, hypothyroidism, recent admission for aspiration pneumonia when it was also uncovered that she had atrial thrombus, not on anticoagulation comes in with chief complaint of unresponsive spell.  According to patient's daughter, patient was being fed when she started noticing that she was uncomfortable in her chair.  Soon after she noted that her head hyperextended, patient was shaking slightly and her tongue turned purple.  Patient was laid flat and EMS was called.  Patient also started spitting out from her nose and her mouth.  When EMS arrived, they noted that patient was hypoxic and in respiratory distress.  They placed her on nonrebreather and brought her to the ER.  Patient is noted to be in respiratory distress breathing about 35-40 times a minute and tachycardic.  She is not responding to any stimuli and is noted to be grunting.  Family reports that patient's CODE STATUS is modified DNR.  They would want patient to be intubated if there was an isolated respiratory issue.  Past Medical History:  Diagnosis Date  . Acute bronchitis   . Alzheimer disease ([place])   . Alzheimer's disease ([place])   . Anxiety state, unspecified   . Dementia in conditions classified elsewhere with behavioral disturbance   . Disorder of bone and cartilage, unspecified   . Dizziness and giddiness   . Hypertension   . IBS (irritable bowel syndrome)   . Irritable bowel syndrome   . Lumbago   . Malnutrition of mild degree ([place])   . Obsessive-compulsive disorders   . Other and unspecified hyperlipidemia   . Personal history of urinary (tract) infection   .  Rickets, active   . Senile dementia, uncomplicated ([place])   . Thyroid disease     Patient Active Problem List   Diagnosis Date Noted  . Lactic acidosis   . Acute metabolic encephalopathy   . Aspiration pneumonia ([place]) 01/09/2018  . Acute hypoxemic respiratory failure ([place]) 01/09/2018  . AKI (acute kidney injury) ([place]) 01/09/2018  . Hyponatremia 01/09/2018  . New onset type 2 diabetes mellitus ([place]) 08/15/2016  . Decreased oral intake   . Goals of care, counseling/discussion   . Palliative care encounter   . Seizure ([place]) 08/06/2016  . Pressure ulcer of both heels, unstageable ([place]) 07/01/2015  . Urinary and fecal incontinence 01/04/2015  . Essential hypertension 01/04/2015  . Gait instability 01/04/2015  . Poor vision 01/04/2015  . Depression 01/04/2015  . Obsessive compulsive disorder 01/04/2015  . Hypothyroidism 08/08/2013  . Alzheimer disease ([place])   . Irritable bowel syndrome   . Malnutrition of mild degree ([place])   . Thyroid disease   . HTN (hypertension)     Past Surgical History:  Procedure Laterality Date  . PARTIAL HYSTERECTOMY    . REPLACEMENT TOTAL KNEE     bilateral  . THYROID SURGERY       OB History   None      Home Medications    Prior to Admission medications   Medication Sig Start Date End Date Taking? Authorizing Provider  albuterol (PROVENTIL) (2.5 MG/3ML) 0.083% nebulizer solution Take 6 mLs (5 mg total) by nebulization 2 (two) times daily  as needed for wheezing or shortness of breath. 01/10/18  Yes Roxan Hockey, MD  blood glucose meter kit and supplies Dispense based on patient and insurance preference. Use up to four times daily as directed. (FOR ICD-9 250.00, 250.01). 08/15/16  Yes Rama, Venetia Maxon, MD  divalproex (DEPAKOTE SPRINKLE) 125 MG capsule Take 2 capsules (250 mg total) by mouth every 12 (twelve) hours. 01/10/18  Yes Emokpae, Courage, MD  guaifenesin (ROBITUSSIN) 100 MG/5ML syrup Take 100-200 mg by mouth 3 (three) times daily as  needed for cough.   Yes [provider]  hydrochlorothiazide (HYDRODIURIL) 25 MG tablet Take 25 mg by mouth daily.   Yes [provider]  insulin glargine (LANTUS) 100 UNIT/ML injection Inject 20 units of Lantus every evening at 10 pm. 08/15/16 02/27/18 Yes Rama, Venetia Maxon, MD  insulin lispro (HUMALOG KWIKPEN) 100 UNIT/ML KiwkPen Inject 0.03 mLs (3 Units total) into the skin 3 (three) times daily. Patient taking differently: Inject 0-10 Units into the skin 3 (three) times daily after meals.  08/10/16 02/27/18 Yes Dessa Phi, DO  Insulin Pen Needle 31G X 5 MM MISC Use with insulin pens 4 times daily 08/10/16  Yes Dessa Phi, DO  ipratropium-albuterol (DUONEB) 0.5-2.5 (3) MG/3ML SOLN Inhale 3 mLs into the lungs every 8 (eight) hours as needed. For cough/shortness of breath/congestion 01/10/18  Yes Emokpae, Courage, MD  levothyroxine (SYNTHROID, LEVOTHROID) 100 MCG tablet TAKE 1 TABLET BY MOUTH FOR THYROID SUPPLEMENT Patient taking differently: Take 100 mcg by mouth daily before breakfast.  06/01/15  Yes Reed, Tiffany L, DO  nystatin cream (MYCOSTATIN) Apply 1 application topically 2 (two) times daily as needed for dry skin.   Yes [provider]  predniSONE (DELTASONE) 5 MG tablet Take 4 tablets [20 mg] daily for 4 days with food , then 2 tablets [10 mg] daily for 4 days, then back to 5 mg daily indefinitely Patient taking differently: Take 5 mg by mouth daily.  01/10/18  Yes Roxan Hockey, MD  Insulin Glargine (LANTUS) 100 UNIT/ML Solostar Pen Inject 20 Units into the skin daily at 10 pm. Patient not taking: Reported on 02/27/2018 08/15/16 02/27/18  Rama, Venetia Maxon, MD    Family History Family History  Problem Relation Age of Onset  . Aneurysm Mother        abdominal aortic  . Cancer Father        bone  . Cancer Sister        colon  . Diabetes Sister     Social History Social History   Tobacco Use  . Smoking status: Never Smoker  . Smokeless  tobacco: Current User    Types: Snuff  Substance Use Topics  . Alcohol use: No    Alcohol/week: 0.0 standard drinks  . Drug use: No     Allergies   Lisinopril   Review of Systems Review of Systems  Unable to perform ROS: Patient unresponsive     Physical Exam Updated Vital Signs BP 133/73 (BP Location: Left Arm)   Pulse 98   Temp (!) 97.2 F (36.2 C) (Oral)   Resp (!) 24   Ht _0  (1.651 m)   Wt 95 kg   SpO2 100%   BMI 34.85 kg/m   Physical Exam  Constitutional: She appears distressed.  HENT:  Head: Atraumatic.  Eyes: Pupils are equal, round, and reactive to light.  Neck: Neck supple.  Cardiovascular:  Tachycardia  Pulmonary/Chest: She is in respiratory distress.  Generalized rhonchi  Abdominal: Soft. She  exhibits distension.  Genitourinary:  Genitourinary Comments: No incontinence appreciated  Neurological:  Eyes are spontaneously open, patient is mumbling and she is moaning with noxious stimuli.  Patient is not following any commands or responding to verbal stimuli.  Patient is noted to be moving all 4 extremities.  There is no abnormal gaze.  Nursing note and vitals reviewed.    ED Treatments / Results  Labs (all labs ordered are listed, but only abnormal results are displayed) Labs Reviewed  COMPREHENSIVE METABOLIC PANEL - Abnormal; Notable for the following components:      Result Value   Sodium 120 (*)    Chloride 85 (*)    CO2 18 (*)    Glucose, Bld 315 (*)    Creatinine, Ser 1.13 (*)    Calcium 8.4 (*)    Albumin 3.2 (*)    GFR calc non Af Amer 46 (*)    GFR calc Af Amer 53 (*)    Anion gap 17 (*)    All other components within normal limits  CBC WITH DIFFERENTIAL/PLATELET - Abnormal; Notable for the following components:   WBC 14.0 (*)    Neutro Abs 10.2 (*)    Abs Immature Granulocytes 0.60 (*)    All other components within normal limits  URINALYSIS, ROUTINE W REFLEX MICROSCOPIC - Abnormal; Notable for the following components:    APPearance HAZY (*)    Glucose, UA >=500 (*)    Hgb urine dipstick MODERATE (*)    Bacteria, UA RARE (*)    All other components within normal limits  TROPONIN I - Abnormal; Notable for the following components:   Troponin I 0.03 (*)    All other components within normal limits  I-STAT CG4 LACTIC ACID, ED - Abnormal; Notable for the following components:   Lactic Acid, Venous 6.67 (*)    All other components within normal limits  I-STAT ARTERIAL BLOOD GAS, ED - Abnormal; Notable for the following components:   pO2, Arterial 81.0 (*)    All other components within normal limits  CBG MONITORING, ED - Abnormal; Notable for the following components:   Glucose-Capillary 323 (*)    All other components within normal limits  I-STAT CG4 LACTIC ACID, ED - Abnormal; Notable for the following components:   Lactic Acid, Venous 4.07 (*)    All other components within normal limits  CBG MONITORING, ED - Abnormal; Notable for the following components:   Glucose-Capillary 257 (*)    All other components within normal limits  CULTURE, BLOOD (ROUTINE X 2)  CULTURE, BLOOD (ROUTINE X 2)  URINE CULTURE  APTT  PROTIME-INR  BRAIN NATRIURETIC PEPTIDE    EKG EKG Interpretation  Date/Time:  Wednesday February 27 2018 00:03:52 EST Ventricular Rate:  115 PR Interval:    QRS Duration: 93 QT Interval:  313 QTC Calculation: 433 R Axis:   -32 Text Interpretation:  Sinus tachycardia Consider right atrial enlargement Abnormal R-wave progression, early transition LVH with secondary repolarization abnormality Anterior Q waves, possibly due to LVH ST elevation suggests acute pericarditis No acute changes Nonspecific ST and T wave abnormality Confirmed by Varney Biles [phone]) on 02/27/2018 3:15:19 AM   Radiology Ct Head Wo Contrast  Result Date: 02/27/2018 CLINICAL DATA:  Altered mental status EXAM: CT HEAD WITHOUT CONTRAST TECHNIQUE: Contiguous axial images were obtained from the base of the skull  through the vertex without intravenous contrast. COMPARISON:  Head CT 08/06/2016 FINDINGS: Brain: There is no mass, hemorrhage or extra-axial collection. There is severe  atrophy with associated ventriculomegaly, unchanged. Severe chronic white matter changes of ischemic microangiopathy. Vascular: No abnormal hyperdensity of the major intracranial arteries or dural venous sinuses. No intracranial atherosclerosis. Skull: The visualized skull base, calvarium and extracranial soft tissues are normal. Sinuses/Orbits: Left mastoid effusion. Paranasal sinuses are clear. The orbits are normal. IMPRESSION: 1. No acute intracranial abnormality. 2. Severe atrophy and chronic ischemic microangiopathy. Electronically Signed   By: Ulyses Jarred M.D.   On: 02/27/2018 01:26   Dg Chest Port 1 View  Result Date: 02/27/2018 CLINICAL DATA:  Shortness of breath EXAM: PORTABLE CHEST 1 VIEW COMPARISON:  Chest radiograph 01/08/2018 FINDINGS: There are reticular opacities in the right upper lobe, increased from the prior study. Lungs are otherwise clear. Mild cardiomegaly. No pleural effusion or pneumothorax. IMPRESSION: Increased reticular opacities in the right upper lobe may indicate developing infection. Electronically Signed   By: Ulyses Jarred M.D.   On: 02/27/2018 01:04    Procedures .Critical Care Performed by: Varney Biles, MD Authorized by: Varney Biles, MD   Critical care provider statement:    Critical care time (minutes):  45   Critical care start time:  02/27/2018 12:08 AM   Critical care end time:  02/27/2018 3:18 AM   Critical care was necessary to treat or prevent imminent or life-threatening deterioration of the following conditions:  Respiratory failure   Critical care was time spent personally by me on the following activities:  Discussions with consultants, evaluation of patient's response to treatment, examination of patient, ordering and performing treatments and interventions, ordering and  review of laboratory studies, ordering and review of radiographic studies, pulse oximetry, re-evaluation of patient's condition, obtaining history from patient or surrogate and review of old charts   (including critical care time)  Medications Ordered in ED Medications  lactated ringers bolus 500 mL (has no administration in time range)  ipratropium-albuterol (DUONEB) 0.5-2.5 (3) MG/3ML nebulizer solution 3 mL (3 mLs Nebulization Given 02/27/18 0443)  valproate (DEPACON) 250 mg in dextrose 5 % 50 mL IVPB (0 mg Intravenous Stopped 02/27/18 0428)  lactated ringers infusion ( Intravenous New Bag/Given 02/27/18 0331)  vancomycin (VANCOCIN) IVPB 1000 mg/200 mL premix (has no administration in time range)  piperacillin-tazobactam (ZOSYN) IVPB 3.375 g (has no administration in time range)  enoxaparin (LOVENOX) injection 40 mg (has no administration in time range)  insulin aspart (novoLOG) injection 0-9 Units (5 Units Subcutaneous Given 02/27/18 0445)  sodium chloride 0.9 % bolus 500 mL (0 mLs Intravenous Stopped 02/27/18 0144)  ceFEPIme (MAXIPIME) 2 g in sodium chloride 0.9 % 100 mL IVPB (0 g Intravenous Stopped 02/27/18 0217)  sodium chloride 0.9 % bolus 500 mL (0 mLs Intravenous Stopped 02/27/18 0217)  lactated ringers bolus 500 mL (0 mLs Intravenous Stopped 02/27/18 0239)  vancomycin (VANCOCIN) 1,500 mg in sodium chloride 0.9 % 500 mL IVPB (0 mg Intravenous Stopped 02/27/18 0403)     Initial Impression / Assessment and Plan / ED Course  I have reviewed the triage vital signs and the nursing notes.  Pertinent labs & imaging results that were available during my care of the patient were reviewed by me and considered in my medical decision making (see chart for details).  Clinical Course as of Feb 28 608  Wed Feb 27, 2018  0127 Results of the ER findings have been discussed with the patient.  Patient is not in respiratory distress to the point where she was when she first arrived.  She is  appearing more comfortable.  Patient's CODE  STATUS is modified DNR.  Family does not want chest compressions, but they would be amenable to intubation if patient is having primary respiratory failure.  Patient's lactic acid is over 4.  We discussed the need for aggressive IV hydration.  We also discussed that patient's ejection fraction is 30% and she has pulmonary hypertension, and that the fluid resuscitation can potentially lead to respiratory failure.  Conversely we discussed that patient with that lactic acid does require aggressive resuscitation in case this was underlying sepsis.  For now family has wanted conservative fluid management.  They have agreed to 500 mL bolus intermittently with close monitoring rather than 30 cc/kg aggressive hydration at the offset.    [AN]  0312 Repeat lactate is improved to 4 from the initial lactate of greater than 6. Patient continues to be afebrile and her heart rate has improved along with her respiratory rate.  With the information available to me, the elevated lactate is not because of sepsis but because of her hypoxic spell and likely seizure like activity.  Lactic Acid, Venous(!!): 4.07 [AN]  0315 Hemodynamic reassessment completed.   [AN]    Clinical Course User Index [AN] Varney Biles, MD    83 year old female comes in with chief complaint of acute respiratory failure.  She has history of aspiration and recently noted to have atrial thrombus.  She is not on any anticoagulant.  She also has end-stage dementia and is under hospice care but has modified DNR with intubation allowed.  Initial impression includes that patient is having a seizure.  It appears that last time she was in the hospital there was also concerns for seizure and patient was loaded with Keppra.  The other possibility includes intracranial hemorrhage or acute embolic stroke.  It also appears based on lung exam that patient has aspirated.  X-ray and CT head has been ordered.   Appropriate labs have been initiated.  6:09 AM Although patient's condition overall is improved, I still think she needs to be seen by critical care because of her elevated lactate with persistent tachypnea and new hypoxia.  X-ray shows aspiration pneumonitis which might be driving process.  She also needs close neuro evaluation.  Final Clinical Impressions(s) / ED Diagnoses   Final diagnoses:  Acute respiratory failure with hypoxia ([place])  Aspiration pneumonia of right lung, unspecified aspiration pneumonia type, unspecified part of lung ([place])  Seizure-like activity [facility])    ED Discharge Orders    None            Varney Biles, MD 02/27/18 [phone]

## 2018-02-27 NOTE — H&P (Addendum)
PULMONARY / CRITICAL CARE MEDICINE   NAME:  Candice Woodward, MRN:  [id number], DOB:  July 30, 1934, LOS: 0 ADMISSION DATE:  02/26/2018, CONSULTATION DATE: 02/27/2018 REFERRING MD: ED physician, CHIEF COMPLAINT: Shortness of breath  BRIEF HISTORY:    Candice Woodward is an 83 year old lady with dementia coming in with aspiration pneumonia shortness of breath HISTORY OF PRESENT ILLNESS   Candice Woodward is an 83 year old lady with multiple comorbidities including advanced dementia on hospice, diabetes mellitus, hypothyroidism coming in with shortness of breath for 1 day. As per daughter patient has been having congestion all day yesterday and she is not able to clear her secretions she is saying that she might have aspirated though she has been making sure to clear her mouth every time she eats.  Patient does have history of aspiration pneumonia and has advanced dementia being bedbound and completely dependent on her daughter and nonverbal.  Patient is on hospice due to her advanced dementia.  No recent history of fever no vomiting no diarrhea.  Patient had what looked like a seizure to the daughter and she called EMS when EMS saw her there she did have pulse unresponsive but her pulse ox was in the 30s so she was put on nonrebreather brought into the emergency room.  We are called to assess the patient for further management.  Patient received more than a liter of IV fluid bolus and cefepime and vancomycin.  SIGNIFICANT PAST MEDICAL HISTORY   -Hypothyroid -Advanced dementia -Bedbound -Diabetes mellitus  SIGNIFICANT EVENTS:   STUDIES:   Chest x-ray showing right upper lobe infiltrate  CULTURES:    ANTIBIOTICS:  Cefepime given in the emergency room Vancomycin started in the emergency room>> -Zosyn 02/27/2018>>  LINES/TUBES:    CONSULTANTS:   SUBJECTIVE:    CONSTITUTIONAL: BP (!) 152/88   Pulse (!) 102   Temp (!) 97.2 F (36.2 C) (Oral)   Resp (!) 26   Wt 95 kg   SpO2 99%   BMI 34.85 kg/m    No intake/output data recorded.        PHYSICAL EXAM: General: Looks acutely ill in moderate respiratory distress with audible chest rattling Neuro: Lethargic opens her eyes to verbal stimuli does not follow any commands nonverbal HEENT: Dry mucous membranes Cardiovascular: Normal heart sound no added sounds or murmurs Lungs: Coarse crackles bilaterally right worse than left Abdomen: Soft no tenderness no organomegaly no guarding Musculoskeletal: Mild bilateral lower limb edema Skin: No rash  RESOLVED PROBLEM LIST   ASSESSMENT AND PLAN   Assessment -Aspiration pneumonia -Acute hypoxemic respiratory failure -Suspected seizure possibly related to hypoxia -Acute metabolic encephalopathy -Lactic acidosis -Acute kidney injury -Severe advanced dementia -Diabetes mellitus with hyperglycemia -History of hypothyroidism  Plan -Patient received cefepime and vancomycin in the emergency room I will continue with vancomycin and add Zosyn -Cultures -DuoNeb every 4 hours -Aggressive pulmonary hygiene -Hold off on any more IV fluid boluses and decrease IV fluid to 50 mill in hour of LR -Insulin sliding scale -Change Depakote to IV -Start levothyroxine IV if patient mental status does not improve or insert nasogastric tube -DVT prophylaxis -Palliative care consult in the morning -Since patient is not able to provide any history or speak for herself I had a long discussion with the daughter about goals of care patient is on hospice at home but she wants to continue aggressive care we discussed overall guarded prognosis with potential poor prognosis if patient respiratory status worsens daughter agreed about DNR with no intubation no resuscitation  I have spent 50 minutes of critical care time this time was spent bedside or in the unit this time was exclusive of any billable procedures patient is needing intensive care due to complex medical problems and complex medical decisions high risk  for further deterioration and high risk for obesity and mortality  SUMMARY OF TODAY'S PLAN:  Antibiotics and close pulmonary status monitoring  Best Practice / Goals of Care / Disposition.   DVT PROPHYLAXIS: Lovenox SUP: None NUTRITION: N.p.o. MOBILITY: Bedrest GOALS OF CARE: DNR FAMILY DISCUSSIONS: Discussed with daughter at bedside DISPOSITION ICU admission  LABS  Glucose Recent Labs  Lab 02/27/18 0002  GLUCAP 323*    BMET Recent Labs  Lab 02/27/18 0011  NA 120*  K 4.3  CL 85*  CO2 18*  BUN 14  CREATININE 1.13*  GLUCOSE 315*    Liver Enzymes Recent Labs  Lab 02/27/18 0011  AST 34  ALT 30  ALKPHOS 76  BILITOT 0.5  ALBUMIN 3.2*    Electrolytes Recent Labs  Lab 02/27/18 0011  CALCIUM 8.4*    CBC Recent Labs  Lab 02/27/18 0011  WBC 14.0*  HGB 13.6  HCT 41.9  PLT 284    ABG Recent Labs  Lab 02/27/18 0119  PHART 7.354  PCO2ART 45.9  PO2ART 81.0*    Coag's Recent Labs  Lab 02/27/18 0011  APTT 30  INR 1.05    Sepsis Markers Recent Labs  Lab 02/27/18 0018  LATICACIDVEN 6.67*    Cardiac Enzymes Recent Labs  Lab 02/27/18 0011  TROPONINI 0.03*    PAST MEDICAL HISTORY :   She  has a past medical history of Acute bronchitis, Alzheimer disease ([place]), Alzheimer's disease ([place]), Anxiety state, unspecified, Dementia in conditions classified elsewhere with behavioral disturbance, Disorder of bone and cartilage, unspecified, Dizziness and giddiness, Hypertension, IBS (irritable bowel syndrome), Irritable bowel syndrome, Lumbago, Malnutrition of mild degree ([place]), Obsessive-compulsive disorders, Other and unspecified hyperlipidemia, Personal history of urinary (tract) infection, Rickets, active, Senile dementia, uncomplicated ([place]), and Thyroid disease.  PAST SURGICAL HISTORY:  She  has a past surgical history that includes Thyroid surgery; Partial hysterectomy; and Replacement total knee.  Allergies  Allergen Reactions  . Lisinopril  Swelling    No current facility-administered medications on file prior to encounter.    Current Outpatient Medications on File Prior to Encounter  Medication Sig  . albuterol (PROVENTIL) (2.5 MG/3ML) 0.083% nebulizer solution Take 6 mLs (5 mg total) by nebulization 2 (two) times daily as needed for wheezing or shortness of breath.  . blood glucose meter kit and supplies Dispense based on patient and insurance preference. Use up to four times daily as directed. (FOR ICD-9 250.00, 250.01).  Marland Kitchen divalproex (DEPAKOTE SPRINKLE) 125 MG capsule Take 2 capsules (250 mg total) by mouth every 12 (twelve) hours.  Marland Kitchen guaifenesin (ROBITUSSIN) 100 MG/5ML syrup Take 100-200 mg by mouth 3 (three) times daily as needed for cough.  . hydrochlorothiazide (HYDRODIURIL) 25 MG tablet Take 25 mg by mouth daily.  . insulin glargine (LANTUS) 100 UNIT/ML injection Inject 20 units of Lantus every evening at 10 pm.  . insulin lispro (HUMALOG KWIKPEN) 100 UNIT/ML KiwkPen Inject 0.03 mLs (3 Units total) into the skin 3 (three) times daily. (Patient taking differently: Inject 0-10 Units into the skin 3 (three) times daily after meals. )  . Insulin Pen Needle 31G X 5 MM MISC Use with insulin pens 4 times daily  . ipratropium-albuterol (DUONEB) 0.5-2.5 (3) MG/3ML SOLN Inhale 3 mLs into the  lungs every 8 (eight) hours as needed. For cough/shortness of breath/congestion  . levothyroxine (SYNTHROID, LEVOTHROID) 100 MCG tablet TAKE 1 TABLET BY MOUTH FOR THYROID SUPPLEMENT (Patient taking differently: Take 100 mcg by mouth daily before breakfast. )  . nystatin cream (MYCOSTATIN) Apply 1 application topically 2 (two) times daily as needed for dry skin.  . predniSONE (DELTASONE) 5 MG tablet Take 4 tablets [20 mg] daily for 4 days with food , then 2 tablets [10 mg] daily for 4 days, then back to 5 mg daily indefinitely (Patient taking differently: Take 5 mg by mouth daily. )  . Insulin Glargine (LANTUS) 100 UNIT/ML Solostar Pen Inject 20  Units into the skin daily at 10 pm. (Patient not taking: Reported on 02/27/2018)    FAMILY HISTORY:   Her family history includes Aneurysm in her mother; Cancer in her father and sister; Diabetes in her sister. Could not be obtained due to patient altered mental status SOCIAL HISTORY:  She  reports that she has never smoked. Her smokeless tobacco use includes snuff. She reports that she does not drink alcohol or use drugs.  Could not be obtained due to patient altered mental status was patient lives with her daughter she is totally dependent bedbound  REVIEW OF SYSTEMS:    Could not be obtained due to patient altered mental status

## 2018-02-27 NOTE — ED Notes (Signed)
Attempted to call report, was told the RN will call me back.

## 2018-02-28 DIAGNOSIS — E1122 Type 2 diabetes mellitus with diabetic chronic kidney disease: Secondary | ICD-10-CM

## 2018-02-28 DIAGNOSIS — Z794 Long term (current) use of insulin: Secondary | ICD-10-CM

## 2018-02-28 DIAGNOSIS — R569 Unspecified convulsions: Secondary | ICD-10-CM

## 2018-02-28 DIAGNOSIS — N183 Chronic kidney disease, stage 3 (moderate): Secondary | ICD-10-CM

## 2018-02-28 LAB — BASIC METABOLIC PANEL
Anion gap: 8 (ref 5–15)
BUN: 10 mg/dL (ref 8–23)
CHLORIDE: 91 mmol/L — AB (ref 98–111)
CO2: 27 mmol/L (ref 22–32)
Calcium: 8.2 mg/dL — ABNORMAL LOW (ref 8.9–10.3)
Creatinine, Ser: 1.07 mg/dL — ABNORMAL HIGH (ref 0.44–1.00)
GFR calc Af Amer: 57 mL/min — ABNORMAL LOW (ref >60)
GFR calc non Af Amer: 49 mL/min — ABNORMAL LOW (ref >60)
GLUCOSE: 178 mg/dL — AB (ref 70–99)
POTASSIUM: 3.6 mmol/L (ref 3.5–5.1)
Sodium: 126 mmol/L — ABNORMAL LOW (ref 135–145)

## 2018-02-28 LAB — URINE CULTURE: CULTURE: NO GROWTH

## 2018-02-28 LAB — GLUCOSE, CAPILLARY
GLUCOSE-CAPILLARY: 174 mg/dL — AB (ref 70–99)
GLUCOSE-CAPILLARY: 207 mg/dL — AB (ref 70–99)
Glucose-Capillary: 153 mg/dL — ABNORMAL HIGH (ref 70–99)
Glucose-Capillary: 218 mg/dL — ABNORMAL HIGH (ref 70–99)
Glucose-Capillary: 185 mg/dL — ABNORMAL HIGH (ref 70–99)

## 2018-02-28 LAB — BRAIN NATRIURETIC PEPTIDE: B NATRIURETIC PEPTIDE 5: 207 pg/mL — AB (ref 0.0–100.0)

## 2018-02-28 MED ORDER — BUDESONIDE 0.25 MG/2ML IN SUSP
0.2500 mg | Freq: Two times a day (BID) | RESPIRATORY_TRACT | Status: DC
  Administered 2018-02-28 – 2018-03-02 (×5): 0.25 mg via RESPIRATORY_TRACT
  Filled 2018-02-28 (×5): qty 2

## 2018-02-28 MED ORDER — DIVALPROEX SODIUM 125 MG PO CSDR
250.0000 mg | DELAYED_RELEASE_CAPSULE | Freq: Two times a day (BID) | ORAL | Status: DC
  Administered 2018-03-01 – 2018-03-02 (×3): 250 mg via ORAL
  Filled 2018-02-28 (×5): qty 2

## 2018-02-28 NOTE — Evaluation (Signed)
Clinical/Bedside Swallow Evaluation Patient Details  Name: Candice Woodward MRN: [id number] Date of Birth: 12/05/1934  Today's Date: 02/28/2018 Time: SLP Start Time (ACUTE ONLY): 1020 SLP Stop Time (ACUTE ONLY): 1127 SLP Time Calculation (min) (ACUTE ONLY): 67 min  Past Medical History:  Past Medical History:  Diagnosis Date  . Acute bronchitis   . Alzheimer disease ([facility])   . Alzheimer's disease ([facility])   . Anxiety state, unspecified   . Dementia in conditions classified elsewhere with behavioral disturbance   . Disorder of bone and cartilage, unspecified   . Dizziness and giddiness   . Hypertension   . IBS (irritable bowel syndrome)   . Irritable bowel syndrome   . Lumbago   . Malnutrition of mild degree ([facility])   . Obsessive-compulsive disorders   . Other and unspecified hyperlipidemia   . Personal history of urinary (tract) infection   . Rickets, active   . Senile dementia, uncomplicated ([facility])   . Thyroid disease    Past Surgical History:  Past Surgical History:  Procedure Laterality Date  . PARTIAL HYSTERECTOMY    . REPLACEMENT TOTAL KNEE     bilateral  . THYROID SURGERY     HPI:  83 y.o. female with PMHx of severe Alzheimer's dementia, cared for at home by her daughter, admitted after witnessed aspiration event.  Pt put on NRB, abx, and per chart, is approaching baseline today.  Pt is being followed by home hospice.  She has a hx of chronic dysphagia.  She was last seen by our SLP service 08/08/16 - at that time, pt's daughter was managing strategies for swallowing/feeding quite well, and had good understanding of the impact of dementia on swallowing.    Assessment / Plan / Recommendation Clinical Impression  Pt presents with progressively declining swallow function, c/w an advancing Alzheimer's dementia, that is marked by intermittent s/s of aspiration, oral holding of POs, likely delayed onset of swallow response.  As session and PO trials progressed, pt's tolerance  deteriorated, coughing increased, and Sp02 dropped to 89%. Pt became tachypneic -RR increased to mid 30s-low 40s.  RN notified - pt put on 2 liters [place]; RT consulted for breathing treatment.  Daughter, Candice Woodward, present - has been taking care of her mother and providing total care.  She is knowledgeable about swallowing strategies/precautions.  Today, she is notably worried about her mother's condition.  We discussed the trajectory of swallowing as it relates to dementia, and the likelihood that Candice Woodward is aspirating with greater frequency.   [name] verbalizes understanding.  Spoke with Leighton ParodyAnn Marie Wilson of home hospice re: the above.    Hold POs for now until Candice Woodward is more comfortable; dysphagia 1/thin liquids is pt's baseline diet. D/W Dr. Elvera LennoxGherghe.  SLP Visit Diagnosis: Dysphagia, unspecified (R13.10)    Aspiration Risk  Moderate aspiration risk    Diet Recommendation     Medication Administration: Crushed with puree    Other  Recommendations Oral Care Recommendations: Oral care QID   Follow up Recommendations None      Frequency and Duration min 1 x/week  1 week       Prognosis Prognosis for Safe Diet Advancement: Guarded Barriers to Reach Goals: Cognitive deficits      Swallow Study   General HPI: 83 y.o. female with PMHx of severe Alzheimer's dementia, cared for at home by her daughter, admitted after witnessed aspiration event.  Pt put on NRB, abx, and per chart, is approaching baseline today.  Pt is being  followed by home hospice.  She has a hx of chronic dysphagia.  She was last seen by our SLP service 08/08/16 - at that time, pt's daughter was managing strategies for swallowing/feeding quite well, and had good understanding of the impact of dementia on swallowing.  Type of Study: Bedside Swallow Evaluation Previous Swallow Assessment: see hpi Diet Prior to this Study: NPO Temperature Spikes Noted: No Respiratory Status: Room air History of Recent Intubation:  No Behavior/Cognition: Alert;Confused;Doesn't follow directions Oral Cavity Assessment: Within Functional Limits Oral Care Completed by SLP: Recent completion by staff Oral Cavity - Dentition: Edentulous Self-Feeding Abilities: Total assist Patient Positioning: Upright in bed Baseline Vocal Quality: Not observed Volitional Cough: Cognitively unable to elicit Volitional Swallow: Unable to elicit    Oral/Motor/Sensory Function     Ice Chips Ice chips: Not tested   Thin Liquid Thin Liquid: Impaired Presentation: Cup Oral Phase Functional Implications: Prolonged oral transit;Oral holding Pharyngeal  Phase Impairments: Cough - Immediate(intermittent)    Nectar Thick Nectar Thick Liquid: Impaired Presentation: Cup Oral phase functional implications: Oral holding Pharyngeal Phase Impairments: Cough - Immediate(intermittent)   Honey Thick Honey Thick Liquid: Not tested   Puree Puree: Impaired Presentation: Spoon Oral Phase Functional Implications: Oral holding   Solid     Solid: Not tested      Candice Woodward 02/28/2018,11:42 AM   Candice Folks L. Samson Frederic, MA CCC/SLP Acute Rehabilitation Services Office number [phone] Pager [phone]

## 2018-02-28 NOTE — Progress Notes (Signed)
PROGRESS NOTE  Candice Woodward ZOX:[id number] DOB: 03-14-1935 DOA: 02/26/2018 PCP: Jiles Garter, FNP   LOS: 1 day   Brief Narrative / Interim history: 83 year old female with advanced dementia on hospice, diabetes mellitus, hypothyroidism, presents to the hospital brought by her daughter with an acute episode of shortness of breath and an ability to clear her secretions.  Daughter is at bedside and tells me that she became poorly responsive and noticed that her tongue started turning blue and decided to come her to the hospital.  Her pulse ox was in the 30s when EMS came.  Subjective: She is nonverbal, essentially bedbound, daughter is at bedside and tells me that she is close to her baseline  Assessment & Plan: Active Problems:   Aspiration pneumonia (HCC)   Principal Problem Acute hypoxic respiratory failure in the setting of aspiration pneumonia -Patient started on vancomycin and Zosyn yesterday, discontinue vancomycin and continue Zosyn alone -Underlying dementia and ongoing dysphagia/aspiration complicate things -Patient is followed by hospice, but goals of care somewhat unclear, daughter is quite conflicted about the future, I have consulted palliative, appreciate input -Remains hypoxic, aspirated during SLP evaluation and became tachypneic and with increased wheezing  Additional Problems Chronic kidney disease stage III -Creatinine has been at baseline  Hyponatremia -Sodium 120 on admission, likely dehydrated, received IV fluids and improved to 126 this morning, continue to monitor  Underlying dementia -Nonverbal, essentially bedbound  Acute metabolic encephalopathy -Getting close to baseline per daughter but she is a poor baseline to begin with  Diabetes mellitus -Uncontrolled, with chronic kidney disease, continue sliding scale -Most recent A1c 8.7 last month, avoid too strict control given age/dementia/variable p.o. intake  Hypothyroidism -Resume Synthroid once  p.o. intake is consistent  Scheduled Meds: . budesonide (PULMICORT) nebulizer solution  0.25 mg Nebulization BID  . enoxaparin (LOVENOX) injection  40 mg Subcutaneous Daily  . insulin aspart  0-9 Units Subcutaneous Q4H  . ipratropium-albuterol  3 mL Nebulization Q4H   Continuous Infusions: . lactated ringers    . lactated ringers 50 mL/hr at 02/27/18 2322  . piperacillin-tazobactam (ZOSYN)  IV 3.375 g (02/28/18 0654)  . valproate sodium 250 mg (02/28/18 1012)  . vancomycin 1,000 mg (02/28/18 0540)   PRN Meds:.lactated ringers  DVT prophylaxis: Lovenox Code Status: DNR Family Communication: Daughter present at bedside Disposition Plan: Home when ready  Consultants:   Palliative care  Procedures:   None   Antimicrobials:  Zosyn 11/27 >>   Objective: Vitals:   02/28/18 0648 02/28/18 0741 02/28/18 0852 02/28/18 0857  BP:  (!) 145/77    Pulse:  87 86   Resp:   20   Temp:  98.2 F (36.8 C)    TempSrc:  Oral    SpO2:  100% 93% 93%  Weight: 96.4 kg     Height:        Intake/Output Summary (Last 24 hours) at 02/28/2018 1110 Last data filed at 02/28/2018 0540 Gross per 24 hour  Intake 724.88 ml  Output 800 ml  Net -75.12 ml   Filed Weights   02/27/18 0017 02/28/18 0648  Weight: 95 kg 96.4 kg    Examination:  Constitutional: crying at times Eyes: No scleral icterus ENMT: Mucous membranes are moist.  Neck: normal, supple Respiratory: Coarse, gurgling breath sounds bilaterally without significant wheezing.  Shallow respiratory effort Cardiovascular: Regular rate and rhythm, no murmurs heard.  No peripheral edema Abdomen: no tenderness. Bowel sounds positive.  Musculoskeletal: no clubbing / cyanosis. Skin: no rashes Neurologic: Does  not follow commands, appears to move spontaneously all extremities  Data Reviewed: I have independently reviewed following labs and imaging studies   CBC: Recent Labs  Lab 02/27/18 0011  WBC 14.0*  NEUTROABS 10.2*  HGB  13.6  HCT 41.9  MCV 86.4  PLT 284   Basic Metabolic Panel: Recent Labs  Lab 02/27/18 0011 02/28/18 0728  NA 120* 126*  K 4.3 3.6  CL 85* 91*  CO2 18* 27  GLUCOSE 315* 178*  BUN 14 10  CREATININE 1.13* 1.07*  CALCIUM 8.4* 8.2*   GFR: Estimated Creatinine Clearance: 48.2 mL/min (A) (by C-G formula based on SCr of 1.07 mg/dL (H)). Liver Function Tests: Recent Labs  Lab 02/27/18 0011  AST 34  ALT 30  ALKPHOS 76  BILITOT 0.5  PROT 7.4  ALBUMIN 3.2*   No results for input(s): LIPASE, AMYLASE in the last 168 hours. No results for input(s): AMMONIA in the last 168 hours. Coagulation Profile: Recent Labs  Lab 02/27/18 0011  INR 1.05   Cardiac Enzymes: Recent Labs  Lab 02/27/18 0011  TROPONINI 0.03*   BNP (last 3 results) No results for input(s): PROBNP in the last 8760 hours. HbA1C: No results for input(s): HGBA1C in the last 72 hours. CBG: Recent Labs  Lab 02/27/18 1702 02/27/18 1949 02/27/18 2313 02/28/18 0329 02/28/18 0740  GLUCAP 163* 126* 119* 153* 174*   Lipid Profile: No results for input(s): CHOL, HDL, LDLCALC, TRIG, CHOLHDL, LDLDIRECT in the last 72 hours. Thyroid Function Tests: No results for input(s): TSH, T4TOTAL, FREET4, T3FREE, THYROIDAB in the last 72 hours. Anemia Panel: No results for input(s): VITAMINB12, FOLATE, FERRITIN, TIBC, IRON, RETICCTPCT in the last 72 hours. Urine analysis:    Component Value Date/Time   COLORURINE YELLOW 02/27/2018 0011   APPEARANCEUR HAZY (A) 02/27/2018 0011   LABSPEC 1.015 02/27/2018 0011   PHURINE 5.0 02/27/2018 0011   GLUCOSEU >=500 (A) 02/27/2018 0011   HGBUR MODERATE (A) 02/27/2018 0011   BILIRUBINUR NEGATIVE 02/27/2018 0011   KETONESUR NEGATIVE 02/27/2018 0011   PROTEINUR NEGATIVE 02/27/2018 0011   NITRITE NEGATIVE 02/27/2018 0011   LEUKOCYTESUR NEGATIVE 02/27/2018 0011   Sepsis Labs: Invalid input(s): PROCALCITONIN, LACTICIDVEN  Recent Results (from the past 240 hour(s))  Blood Culture  (routine x 2)     Status: None (Preliminary result)   Collection Time: 02/27/18 12:11 AM  Result Value Ref Range Status   Specimen Description BLOOD LEFT HAND  Final   Special Requests   Final    BOTTLES DRAWN AEROBIC AND ANAEROBIC Blood Culture adequate volume   Culture   Final    NO GROWTH 1 DAY Performed at [facility], [address], [place], [place] [address]    Report Status PENDING  Incomplete  Blood Culture (routine x 2)     Status: None (Preliminary result)   Collection Time: 02/27/18 12:11 AM  Result Value Ref Range Status   Specimen Description BLOOD RIGHT WRIST  Final   Special Requests   Final    BOTTLES DRAWN AEROBIC ONLY Blood Culture adequate volume   Culture  Setup Time   Final    GRAM POSITIVE COCCI IN CLUSTERS AEROBIC BOTTLE ONLY Organism ID to follow CRITICAL RESULT CALLED TO, READ BACK BY AND VERIFIED WITH: H.BAIRD,PHARMD AT 2143 ON 02/27/18 BY G.MCADOO Performed at [facility], [address] [place], [place] [address]    Culture GRAM POSITIVE COCCI  Final   Report Status PENDING  Incomplete  Urine culture  Status: None   Collection Time: 02/27/18 12:11 AM  Result Value Ref Range Status   Specimen Description URINE, RANDOM  Final   Special Requests NONE  Final   Culture   Final    NO GROWTH Performed at [facility], [address] [place], [place] [address]    Report Status 02/28/2018 FINAL  Final  Blood Culture ID Panel (Reflexed)     Status: Abnormal   Collection Time: 02/27/18 12:11 AM  Result Value Ref Range Status   Enterococcus species NOT DETECTED NOT DETECTED Final   Listeria monocytogenes NOT DETECTED NOT DETECTED Final   Staphylococcus species DETECTED (A) NOT DETECTED Final    Comment: Methicillin (oxacillin) resistant coagulase negative staphylococcus. Possible blood culture contaminant (unless isolated from more than one blood culture draw or clinical case suggests pathogenicity). No antibiotic treatment is  indicated for blood  culture contaminants. CRITICAL RESULT CALLED TO, READ BACK BY AND VERIFIED WITH: H.BAIRD,PHARMD AT 2143 ON 02/27/18 BY G.MCADOO    Staphylococcus aureus (BCID) NOT DETECTED NOT DETECTED Final   Methicillin resistance DETECTED (A) NOT DETECTED Final    Comment: CRITICAL RESULT CALLED TO, READ BACK BY AND VERIFIED WITH: H.BAIRD,PHARMD AT 2143 ON 02/27/18 BY G.MCADOO    Streptococcus species NOT DETECTED NOT DETECTED Final   Streptococcus agalactiae NOT DETECTED NOT DETECTED Final   Streptococcus pneumoniae NOT DETECTED NOT DETECTED Final   Streptococcus pyogenes NOT DETECTED NOT DETECTED Final   Acinetobacter baumannii NOT DETECTED NOT DETECTED Final   Enterobacteriaceae species NOT DETECTED NOT DETECTED Final   Enterobacter cloacae complex NOT DETECTED NOT DETECTED Final   Escherichia coli NOT DETECTED NOT DETECTED Final   Klebsiella oxytoca NOT DETECTED NOT DETECTED Final   Klebsiella pneumoniae NOT DETECTED NOT DETECTED Final   Proteus species NOT DETECTED NOT DETECTED Final   Serratia marcescens NOT DETECTED NOT DETECTED Final   Haemophilus influenzae NOT DETECTED NOT DETECTED Final   Neisseria meningitidis NOT DETECTED NOT DETECTED Final   Pseudomonas aeruginosa NOT DETECTED NOT DETECTED Final   Candida albicans NOT DETECTED NOT DETECTED Final   Candida glabrata NOT DETECTED NOT DETECTED Final   Candida krusei NOT DETECTED NOT DETECTED Final   Candida parapsilosis NOT DETECTED NOT DETECTED Final   Candida tropicalis NOT DETECTED NOT DETECTED Final    Comment: Performed at [facility] Lab, [address], [place], [place] [address]  MRSA PCR Screening     Status: None   Collection Time: 02/27/18 12:21 PM  Result Value Ref Range Status   MRSA by PCR NEGATIVE NEGATIVE Final    Comment:        The GeneXpert MRSA Assay (FDA approved for NASAL specimens only), is one component of a comprehensive MRSA colonization surveillance program. It is not intended  to diagnose MRSA infection nor to guide or monitor treatment for MRSA infections. Performed at [facility], [address] [place], [place] [address]       Radiology Studies: Ct Head Wo Contrast  Result Date: 02/27/2018 CLINICAL DATA:  Altered mental status EXAM: CT HEAD WITHOUT CONTRAST TECHNIQUE: Contiguous axial images were obtained from the base of the skull through the vertex without intravenous contrast. COMPARISON:  Head CT 08/06/2016 FINDINGS: Brain: There is no mass, hemorrhage or extra-axial collection. There is severe atrophy with associated ventriculomegaly, unchanged. Severe chronic white matter changes of ischemic microangiopathy. Vascular: No abnormal hyperdensity of the major intracranial arteries or dural venous sinuses. No intracranial atherosclerosis. Skull: The visualized skull  base, calvarium and extracranial soft tissues are normal. Sinuses/Orbits: Left mastoid effusion. Paranasal sinuses are clear. The orbits are normal. IMPRESSION: 1. No acute intracranial abnormality. 2. Severe atrophy and chronic ischemic microangiopathy. Electronically Signed   By: Deatra RobinsonKevin  Herman M.D.   On: 02/27/2018 01:26   Dg Chest Port 1 View  Result Date: 02/27/2018 CLINICAL DATA:  Shortness of breath EXAM: PORTABLE CHEST 1 VIEW COMPARISON:  Chest radiograph 01/08/2018 FINDINGS: There are reticular opacities in the right upper lobe, increased from the prior study. Lungs are otherwise clear. Mild cardiomegaly. No pleural effusion or pneumothorax. IMPRESSION: Increased reticular opacities in the right upper lobe may indicate developing infection. Electronically Signed   By: Deatra RobinsonKevin  Herman M.D.   On: 02/27/2018 01:04     Pamella Pertostin Gherghe, MD, PhD [facility] Pager [phone]  If 7PM-7AM, please contact night-coverage [email or web address] Password [email or web address] 02/28/2018, 11:10 AM

## 2018-03-01 DIAGNOSIS — Z7189 Other specified counseling: Secondary | ICD-10-CM

## 2018-03-01 DIAGNOSIS — E871 Hypo-osmolality and hyponatremia: Secondary | ICD-10-CM

## 2018-03-01 LAB — CULTURE, BLOOD (ROUTINE X 2): SPECIAL REQUESTS: ADEQUATE

## 2018-03-01 LAB — BASIC METABOLIC PANEL
ANION GAP: 9 (ref 5–15)
BUN: 10 mg/dL (ref 8–23)
CO2: 26 mmol/L (ref 22–32)
Calcium: 8.5 mg/dL — ABNORMAL LOW (ref 8.9–10.3)
Chloride: 92 mmol/L — ABNORMAL LOW (ref 98–111)
Creatinine, Ser: 1.01 mg/dL — ABNORMAL HIGH (ref 0.44–1.00)
GFR calc Af Amer: >60 mL/min (ref >60)
GFR, EST NON AFRICAN AMERICAN: 53 mL/min — AB (ref >60)
Glucose, Bld: 146 mg/dL — ABNORMAL HIGH (ref 70–99)
POTASSIUM: 3.7 mmol/L (ref 3.5–5.1)
SODIUM: 127 mmol/L — AB (ref 135–145)

## 2018-03-01 LAB — GLUCOSE, CAPILLARY
GLUCOSE-CAPILLARY: 150 mg/dL — AB (ref 70–99)
GLUCOSE-CAPILLARY: 221 mg/dL — AB (ref 70–99)
Glucose-Capillary: 195 mg/dL — ABNORMAL HIGH (ref 70–99)
Glucose-Capillary: 153 mg/dL — ABNORMAL HIGH (ref 70–99)
Glucose-Capillary: 210 mg/dL — ABNORMAL HIGH (ref 70–99)
Glucose-Capillary: 208 mg/dL — ABNORMAL HIGH (ref 70–99)

## 2018-03-01 MED ORDER — SCOPOLAMINE 1 MG/3DAYS TD PT72
1.0000 | MEDICATED_PATCH | TRANSDERMAL | Status: DC
  Administered 2018-03-01: 1.5 mg via TRANSDERMAL
  Filled 2018-03-01: qty 1

## 2018-03-01 MED ORDER — PREDNISONE 5 MG PO TABS
5.0000 mg | ORAL_TABLET | Freq: Every day | ORAL | Status: DC
  Administered 2018-03-02: 5 mg via ORAL
  Filled 2018-03-01: qty 1

## 2018-03-01 MED ORDER — LEVOTHYROXINE SODIUM 100 MCG PO TABS
100.0000 ug | ORAL_TABLET | Freq: Every day | ORAL | Status: DC
  Administered 2018-03-02: 100 ug via ORAL
  Filled 2018-03-01: qty 1

## 2018-03-01 NOTE — Progress Notes (Addendum)
[facility] 2W-18 [facility] ([facility] GIP RN Visit @ 0930  This is a related and covered GIP admission of 02/27/2018 with [facility] diagnosis of Alzheimer's disease per Dr. Barbee ShropshireHertweck. Patient has OOF DNR in home.  Family activated EMS after patient appeared to have aspirated and become unresponsive. On arrival by fire the patient oxygen saturation was in the 30's. She was repositioned, placed on NRB with improved saturations. Patient was admitted for aspiration pneumonia.   Day 3 of [facility] GIP.  Visited with patient and daughter, [name] at bedside. Pt is non-verbal but does respond to voice and kisses from daughter. Pt does not appear to be in any distress. [name] is very anxious this morning. She left for a few hours last evening and feels she should not have. She is very focused on the patient's positioning in the bed and the breakfast tray. [name] does feed the patient vanilla pudding and orange juice during my visit. [name] does appear to be able to swallow without coughing or sputtering though she is clearing her throat more after several bites. [name] asks about keeping her mom from becoming dehydrated. I shared with her that she is likely not feeling thirst and hunger as we know it and encouraged her to refer back to her notebook given by hospice about Love and Chicken [employer].  I attempted to have a discussion with her that despite the best positioning and food choices/preparation that patient may still aspirate, this is due to her disease process progressing. I brought up that should this happen, returning to the hospital is not comfort focused. [name] did not want to hear or discuss this further, she states "she needed the hospital and I wasn't going to just let her go out like that". She states she "needed to do something for her, her tongue was blue". We discussed oxygen possibly in the home and suction with [employer], perhaps this would give [name] some confidence that she is  attempting to do something more for her mother.   Pt is on 2 lpm Dana O2, Purewick catheter to wall suction with little clear yellow urine in canister. Pt is receiving Zosyn 3.375 g every 8 hours IV and LR 50 cc/hr. No prn medications administered. [name] shared that Dr. Lafe GarinGherge is adding a scopolamine patch to help with secretions.  Goals of care: Attempted to have further discussion this morning but [name] is avoiding conversation. [name] does desire to take patient home with continued hospice support.  Communication with [facility]: per clinical note, phone conversation with Albin FellingAnne Batten, [facility] LCSW, left message for Edd Fabiananya Troutman, [facility] RN to call when available for update regarding O2 and suction  Please use [facility] if ambulance transport is needed at time of discharge.   Please call with any hospice related questions or concerns.  Thank you, Haynes Bastracy Ennis, RN, BSN [facility] Hospital Liaison [phone]  [facility] Liaisons are on AMION.

## 2018-03-01 NOTE — Progress Notes (Signed)
PROGRESS NOTE  Candice Woodward ZOX:[id number] DOB: [birth year] DOA: 02/26/2018 PCP: Jiles Garter, FNP   LOS: 2 days   Brief Narrative / Interim history: 83 year old female with advanced dementia on hospice, diabetes mellitus, hypothyroidism, presents to the hospital brought by her daughter with an acute episode of shortness of breath and an ability to clear her secretions.  Daughter is at bedside and tells me that she became poorly responsive and noticed that her tongue started turning blue and decided to come her to the hospital.  Her pulse ox was in the 30s when EMS came.  Subjective: -No significant clinical changes, she is nonverbal laying in bed, opens her eyes and looks at me but does not communicate or follows any commands  Assessment & Plan: Active Problems:   Aspiration pneumonia (HCC)   Principal Problem Acute hypoxic respiratory failure in the setting of aspiration pneumonia -Patient started on vancomycin and Zosyn on admission, discontinued vancomycin and continue Zosyn alone.  Cultures without significant growth, she did have 1/2 coag negative staph in the blood which are thought to be contaminant -Underlying dementia and ongoing dysphagia/aspiration complicate things -Hypoxia slowly improving, still requiring oxygen but will attempt to wean off to room air -Add scopolamine patch to help with secretions  Additional Problems Chronic kidney disease stage III -Creatinine has been at baseline  Hyponatremia -Sodium 120 on admission, likely dehydrated, received IV fluids and slowly improving, will discontinue IV fluids today and check it again tomorrow morning, it is 127 this morning  Goals of care -Extensive discussions with the daughter at bedside regarding current clinical picture.  While she seems to be recovering after this most recent aspiration episode, moving forward things will continue to get worse.  Discussed about the fact that she she probably has silent  aspiration of her own secretions as well as food despite the patient receiving excellent care from her daughter.  The daughter seems to be having a degree of anxiety that she is missing things and not doing everything right.  Acknowledges ongoing hospice support at home, however she is not ready for full comfort care or avoiding further hospitalizations.  I have also discussed the case with hospice RN  Underlying dementia -Nonverbal, essentially bedbound  Acute metabolic encephalopathy -Getting close to baseline per daughter but she is a poor baseline to begin with  Diabetes mellitus -Uncontrolled, with chronic kidney disease, continue sliding scale -Most recent A1c 8.7 last month, avoid too strict control given age/dementia/variable p.o. intake  Hypothyroidism -Resume Synthroid once p.o. intake is consistent  Scheduled Meds: . budesonide (PULMICORT) nebulizer solution  0.25 mg Nebulization BID  . divalproex  250 mg Oral Q12H  . enoxaparin (LOVENOX) injection  40 mg Subcutaneous Daily  . insulin aspart  0-9 Units Subcutaneous Q4H  . ipratropium-albuterol  3 mL Nebulization Q4H  . scopolamine  1 patch Transdermal Q72H   Continuous Infusions: . lactated ringers    . lactated ringers 50 mL/hr at 02/28/18 1708  . piperacillin-tazobactam (ZOSYN)  IV 3.375 g (03/01/18 1257)   PRN Meds:.lactated ringers  DVT prophylaxis: Lovenox Code Status: DNR Family Communication: Daughter present at bedside Disposition Plan: Home when ready  Consultants:   Palliative care  Procedures:   None   Antimicrobials:  Zosyn 11/27 >>   Objective: Vitals:   03/01/18 0644 03/01/18 0819 03/01/18 0832 03/01/18 1137  BP:  (!) 162/89    Pulse:  99    Resp:  (!) 21    Temp:  98.4 F (36.9  C)    TempSrc:  Oral    SpO2:  99% 93% 96%  Weight: 97.3 kg     Height:        Intake/Output Summary (Last 24 hours) at 03/01/2018 1317 Last data filed at 03/01/2018 0504 Gross per 24 hour  Intake -    Output 400 ml  Net -400 ml   Filed Weights   02/27/18 0017 02/28/18 0648 03/01/18 0644  Weight: 95 kg 96.4 kg 97.3 kg    Examination:  Constitutional: Alert, confused nonverbal Eyes: No icterus seen ENMT: Moist mucous membranes Respiratory: Coarse, gurgling breath sounds bilaterally without significant wheezing, shallow respiratory effort Cardiovascular: Regular rate and rhythm, no murmurs appreciated, no peripheral edema Abdomen: Soft, nontender, nondistended, positive bowel sounds Musculoskeletal: No clubbing or cyanosis Skin: No rashes seen Neurologic: No focal findings, moves all 4 independently but does not follow commands  Data Reviewed: I have independently reviewed following labs and imaging studies   CBC: Recent Labs  Lab 02/27/18 0011  WBC 14.0*  NEUTROABS 10.2*  HGB 13.6  HCT 41.9  MCV 86.4  PLT 284   Basic Metabolic Panel: Recent Labs  Lab 02/27/18 0011 02/28/18 0728 03/01/18 0729  NA 120* 126* 127*  K 4.3 3.6 3.7  CL 85* 91* 92*  CO2 18* 27 26  GLUCOSE 315* 178* 146*  BUN 14 10 10   CREATININE 1.13* 1.07* 1.01*  CALCIUM 8.4* 8.2* 8.5*   GFR: Estimated Creatinine Clearance: 51.3 mL/min (A) (by C-G formula based on SCr of 1.01 mg/dL (H)). Liver Function Tests: Recent Labs  Lab 02/27/18 0011  AST 34  ALT 30  ALKPHOS 76  BILITOT 0.5  PROT 7.4  ALBUMIN 3.2*   No results for input(s): LIPASE, AMYLASE in the last 168 hours. No results for input(s): AMMONIA in the last 168 hours. Coagulation Profile: Recent Labs  Lab 02/27/18 0011  INR 1.05   Cardiac Enzymes: Recent Labs  Lab 02/27/18 0011  TROPONINI 0.03*   BNP (last 3 results) No results for input(s): PROBNP in the last 8760 hours. HbA1C: No results for input(s): HGBA1C in the last 72 hours. CBG: Recent Labs  Lab 02/28/18 2034 03/01/18 0104 03/01/18 0418 03/01/18 0817 03/01/18 1214  GLUCAP 207* 195* 150* 153* 210*   Lipid Profile: No results for input(s): CHOL, HDL,  LDLCALC, TRIG, CHOLHDL, LDLDIRECT in the last 72 hours. Thyroid Function Tests: No results for input(s): TSH, T4TOTAL, FREET4, T3FREE, THYROIDAB in the last 72 hours. Anemia Panel: No results for input(s): VITAMINB12, FOLATE, FERRITIN, TIBC, IRON, RETICCTPCT in the last 72 hours. Urine analysis:    Component Value Date/Time   COLORURINE YELLOW 02/27/2018 0011   APPEARANCEUR HAZY (A) 02/27/2018 0011   LABSPEC 1.015 02/27/2018 0011   PHURINE 5.0 02/27/2018 0011   GLUCOSEU >=500 (A) 02/27/2018 0011   HGBUR MODERATE (A) 02/27/2018 0011   BILIRUBINUR NEGATIVE 02/27/2018 0011   KETONESUR NEGATIVE 02/27/2018 0011   PROTEINUR NEGATIVE 02/27/2018 0011   NITRITE NEGATIVE 02/27/2018 0011   LEUKOCYTESUR NEGATIVE 02/27/2018 0011   Sepsis Labs: Invalid input(s): PROCALCITONIN, LACTICIDVEN  Recent Results (from the past 240 hour(s))  Blood Culture (routine x 2)     Status: None (Preliminary result)   Collection Time: 02/27/18 12:11 AM  Result Value Ref Range Status   Specimen Description BLOOD LEFT HAND  Final   Special Requests   Final    BOTTLES DRAWN AEROBIC AND ANAEROBIC Blood Culture adequate volume   Culture   Final    NO GROWTH  2 DAYS Performed at [facility], [address] [place], [place] [address]    Report Status PENDING  Incomplete  Blood Culture (routine x 2)     Status: Abnormal   Collection Time: 02/27/18 12:11 AM  Result Value Ref Range Status   Specimen Description BLOOD RIGHT WRIST  Final   Special Requests   Final    BOTTLES DRAWN AEROBIC ONLY Blood Culture adequate volume   Culture  Setup Time   Final    GRAM POSITIVE COCCI IN CLUSTERS AEROBIC BOTTLE ONLY Organism ID to follow CRITICAL RESULT CALLED TO, READ BACK BY AND VERIFIED WITH: H.BAIRD,PHARMD AT 2143 ON 02/27/18 BY G.[email or web address]    Culture (A)  Final    STAPHYLOCOCCUS SPECIES (COAGULASE NEGATIVE) THE SIGNIFICANCE OF ISOLATING THIS ORGANISM FROM A SINGLE SET OF BLOOD CULTURES WHEN MULTIPLE SETS ARE  DRAWN IS UNCERTAIN. PLEASE NOTIFY THE MICROBIOLOGY DEPARTMENT WITHIN ONE WEEK IF SPECIATION AND SENSITIVITIES ARE REQUIRED. Performed at [facility], [address] [place], [place] [address]    Report Status 03/01/2018 FINAL  Final  Urine culture     Status: None   Collection Time: 02/27/18 12:11 AM  Result Value Ref Range Status   Specimen Description URINE, RANDOM  Final   Special Requests NONE  Final   Culture   Final    NO GROWTH Performed at [facility], [address] [place], [place] [address]    Report Status 02/28/2018 FINAL  Final  Blood Culture ID Panel (Reflexed)     Status: Abnormal   Collection Time: 02/27/18 12:11 AM  Result Value Ref Range Status   Enterococcus species NOT DETECTED NOT DETECTED Final   Listeria monocytogenes NOT DETECTED NOT DETECTED Final   Staphylococcus species DETECTED (A) NOT DETECTED Final    Comment: Methicillin (oxacillin) resistant coagulase negative staphylococcus. Possible blood culture contaminant (unless isolated from more than one blood culture draw or clinical case suggests pathogenicity). No antibiotic treatment is indicated for blood  culture contaminants. CRITICAL RESULT CALLED TO, READ BACK BY AND VERIFIED WITH: H.BAIRD,PHARMD AT 2143 ON 02/27/18 BY G.[email or web address]    Staphylococcus aureus (BCID) NOT DETECTED NOT DETECTED Final   Methicillin resistance DETECTED (A) NOT DETECTED Final    Comment: CRITICAL RESULT CALLED TO, READ BACK BY AND VERIFIED WITH: H.BAIRD,PHARMD AT 2143 ON 02/27/18 BY G.MCADOO    Streptococcus species NOT DETECTED NOT DETECTED Final   Streptococcus agalactiae NOT DETECTED NOT DETECTED Final   Streptococcus pneumoniae NOT DETECTED NOT DETECTED Final   Streptococcus pyogenes NOT DETECTED NOT DETECTED Final   Acinetobacter baumannii NOT DETECTED NOT DETECTED Final   Enterobacteriaceae species NOT DETECTED NOT DETECTED Final   Enterobacter cloacae complex NOT DETECTED NOT DETECTED Final   Escherichia  coli NOT DETECTED NOT DETECTED Final   Klebsiella oxytoca NOT DETECTED NOT DETECTED Final   Klebsiella pneumoniae NOT DETECTED NOT DETECTED Final   Proteus species NOT DETECTED NOT DETECTED Final   Serratia marcescens NOT DETECTED NOT DETECTED Final   Haemophilus influenzae NOT DETECTED NOT DETECTED Final   Neisseria meningitidis NOT DETECTED NOT DETECTED Final   Pseudomonas aeruginosa NOT DETECTED NOT DETECTED Final   Candida albicans NOT DETECTED NOT DETECTED Final   Candida glabrata NOT DETECTED NOT DETECTED Final   Candida krusei NOT DETECTED NOT DETECTED Final   Candida parapsilosis NOT DETECTED NOT DETECTED Final   Candida tropicalis NOT DETECTED NOT DETECTED Final    Comment: Performed at [facility], [address] [place], [place] [address]  MRSA PCR Screening     Status: None   Collection Time: 02/27/18 12:21 PM  Result Value Ref Range Status   MRSA by PCR NEGATIVE NEGATIVE Final    Comment:        The GeneXpert MRSA Assay (FDA approved for NASAL specimens only), is one component of a comprehensive MRSA colonization surveillance program. It is not intended to diagnose MRSA infection nor to guide or monitor treatment for MRSA infections. Performed at [facility], [address], [place], [place] [address]     Time spent: 35 minutes, more than 50% in direct discussions/counseling with the daughter regarding goals of care as above  Radiology Studies: No results found.   Pamella Pert, MD, PhD [facility] Pager [phone]  If 7PM-7AM, please contact night-coverage [email or web address] Password [email or web address] 03/01/2018, 1:17 PM

## 2018-03-01 NOTE — Progress Notes (Signed)
  Speech Language Pathology Treatment: Dysphagia  Patient Details Name: Candice Woodward MRN: [id number] DOB: 05/09/1934 Today's Date: 03/01/2018 Time: 1100-1117 SLP Time Calculation (min) (ACUTE ONLY): 17 min  Assessment / Plan / Recommendation Clinical Impression  F/u after yesterday's swallowing assessment.  Pt sleeping soundly; session focused on education.  Her daughter, Candice Woodward, is present and reports much improved eating/swallowing and behavioral response to eating this morning.  Pt no longer tachypneic, SpO2 in the 90s. Candice Woodward with good understanding of her mother's dysphagia and the persistent likelihood of ongoing aspiration, as well as the trajectory of decline.  No further acute or HH SLP needs are identified.  Continue dysphagia 1/purees, thin liquids; crush meds in puree.     HPI HPI: 83 y.o. female with PMHx of severe Alzheimer's dementia, cared for at home by her daughter, admitted after witnessed aspiration event.  Pt put on NRB, abx, and per chart, is approaching baseline today.  Pt is being followed by home hospice.  She has a hx of chronic dysphagia.  She was last seen by our SLP service 08/08/16 - at that time, pt's daughter was managing strategies for swallowing/feeding quite well, and had good understanding of the impact of dementia on swallowing.       SLP Plan  All goals met       Recommendations  Diet recommendations: Dysphagia 1 (puree);Thin liquid Medication Administration: Crushed with puree                Oral Care Recommendations: Oral care BID Follow up Recommendations: None SLP Visit Diagnosis: Dysphagia, unspecified (R13.10) Plan: All goals met       GO               Iam Lipson L. Tivis Ringer, [place] CCC/SLP Acute Rehabilitation Services Office number [phone] Pager [phone]  Candice Woodward 03/01/2018, 11:19 AM

## 2018-03-01 NOTE — Clinical Social Work Note (Signed)
Pt and dtr are well-known to this LCSW from [facility]. Dtr Angelique Blonderenise at bedside, discussing recent events. She states she is receptive to oxygen if ordered back at home.LCSW offered supportive counseling as dtr copes with her mother's decline. [name] is very committed to her mother's care, is realistic about DNR but wishes comfort and to treat issues as they arise. Dtr is fulltime caregiver for her mom, has [facility] CNA, RN, LCSW, chaplain in the home. Her understanding is probable d/c home tomorrow. Homecare team will resume.

## 2018-03-02 ENCOUNTER — Inpatient Hospital Stay (HOSPITAL_COMMUNITY)
Admission: EM | Admit: 2018-03-02 | Discharge: 2018-03-10 | DRG: 871 | Disposition: A | Discharge status: Hospice | Attending: Internal Medicine | Admitting: Internal Medicine

## 2018-03-02 ENCOUNTER — Other Ambulatory Visit: Payer: Self-pay

## 2018-03-02 ENCOUNTER — Emergency Department (HOSPITAL_COMMUNITY)

## 2018-03-02 DIAGNOSIS — E119 Type 2 diabetes mellitus without complications: Secondary | ICD-10-CM | POA: Diagnosis present

## 2018-03-02 DIAGNOSIS — J690 Pneumonitis due to inhalation of food and vomit: Secondary | ICD-10-CM | POA: Diagnosis not present

## 2018-03-02 DIAGNOSIS — Z515 Encounter for palliative care: Secondary | ICD-10-CM | POA: Diagnosis present

## 2018-03-02 DIAGNOSIS — J9621 Acute and chronic respiratory failure with hypoxia: Secondary | ICD-10-CM | POA: Diagnosis present

## 2018-03-02 DIAGNOSIS — F429 Obsessive-compulsive disorder, unspecified: Secondary | ICD-10-CM | POA: Diagnosis present

## 2018-03-02 DIAGNOSIS — G309 Alzheimer's disease, unspecified: Secondary | ICD-10-CM | POA: Diagnosis present

## 2018-03-02 DIAGNOSIS — E876 Hypokalemia: Secondary | ICD-10-CM | POA: Diagnosis present

## 2018-03-02 DIAGNOSIS — I10 Essential (primary) hypertension: Secondary | ICD-10-CM | POA: Diagnosis present

## 2018-03-02 DIAGNOSIS — R6521 Severe sepsis with septic shock: Secondary | ICD-10-CM | POA: Diagnosis present

## 2018-03-02 DIAGNOSIS — N179 Acute kidney failure, unspecified: Secondary | ICD-10-CM | POA: Diagnosis present

## 2018-03-02 DIAGNOSIS — R1310 Dysphagia, unspecified: Secondary | ICD-10-CM | POA: Diagnosis present

## 2018-03-02 DIAGNOSIS — A419 Sepsis, unspecified organism: Secondary | ICD-10-CM

## 2018-03-02 DIAGNOSIS — F0390 Unspecified dementia without behavioral disturbance: Secondary | ICD-10-CM

## 2018-03-02 DIAGNOSIS — Z87440 Personal history of urinary (tract) infections: Secondary | ICD-10-CM

## 2018-03-02 DIAGNOSIS — Z66 Do not resuscitate: Secondary | ICD-10-CM | POA: Diagnosis present

## 2018-03-02 DIAGNOSIS — E785 Hyperlipidemia, unspecified: Secondary | ICD-10-CM | POA: Diagnosis present

## 2018-03-02 DIAGNOSIS — Z96652 Presence of left artificial knee joint: Secondary | ICD-10-CM | POA: Diagnosis present

## 2018-03-02 DIAGNOSIS — E875 Hyperkalemia: Secondary | ICD-10-CM | POA: Diagnosis present

## 2018-03-02 DIAGNOSIS — J9601 Acute respiratory failure with hypoxia: Secondary | ICD-10-CM | POA: Diagnosis not present

## 2018-03-02 DIAGNOSIS — Z79899 Other long term (current) drug therapy: Secondary | ICD-10-CM

## 2018-03-02 DIAGNOSIS — F0281 Dementia in other diseases classified elsewhere with behavioral disturbance: Secondary | ICD-10-CM | POA: Diagnosis present

## 2018-03-02 DIAGNOSIS — Z808 Family history of malignant neoplasm of other organs or systems: Secondary | ICD-10-CM

## 2018-03-02 DIAGNOSIS — R627 Adult failure to thrive: Secondary | ICD-10-CM | POA: Diagnosis present

## 2018-03-02 DIAGNOSIS — G9341 Metabolic encephalopathy: Secondary | ICD-10-CM | POA: Diagnosis present

## 2018-03-02 DIAGNOSIS — A4150 Gram-negative sepsis, unspecified: Secondary | ICD-10-CM | POA: Diagnosis present

## 2018-03-02 DIAGNOSIS — E871 Hypo-osmolality and hyponatremia: Secondary | ICD-10-CM | POA: Diagnosis present

## 2018-03-02 DIAGNOSIS — Z833 Family history of diabetes mellitus: Secondary | ICD-10-CM

## 2018-03-02 DIAGNOSIS — Z79890 Hormone replacement therapy (postmenopausal): Secondary | ICD-10-CM

## 2018-03-02 DIAGNOSIS — F0280 Dementia in other diseases classified elsewhere without behavioral disturbance: Secondary | ICD-10-CM | POA: Diagnosis not present

## 2018-03-02 DIAGNOSIS — E872 Acidosis: Secondary | ICD-10-CM | POA: Diagnosis present

## 2018-03-02 DIAGNOSIS — E860 Dehydration: Secondary | ICD-10-CM | POA: Diagnosis present

## 2018-03-02 DIAGNOSIS — Z7189 Other specified counseling: Secondary | ICD-10-CM | POA: Diagnosis not present

## 2018-03-02 DIAGNOSIS — I110 Hypertensive heart disease with heart failure: Secondary | ICD-10-CM | POA: Diagnosis present

## 2018-03-02 DIAGNOSIS — R0602 Shortness of breath: Secondary | ICD-10-CM

## 2018-03-02 DIAGNOSIS — Z96651 Presence of right artificial knee joint: Secondary | ICD-10-CM | POA: Diagnosis present

## 2018-03-02 DIAGNOSIS — Z800 Family history of malignant neoplasm of digestive organs: Secondary | ICD-10-CM

## 2018-03-02 DIAGNOSIS — Z87891 Personal history of nicotine dependence: Secondary | ICD-10-CM

## 2018-03-02 DIAGNOSIS — M479 Spondylosis, unspecified: Secondary | ICD-10-CM | POA: Diagnosis present

## 2018-03-02 DIAGNOSIS — J9622 Acute and chronic respiratory failure with hypercapnia: Secondary | ICD-10-CM | POA: Diagnosis present

## 2018-03-02 DIAGNOSIS — F419 Anxiety disorder, unspecified: Secondary | ICD-10-CM | POA: Diagnosis present

## 2018-03-02 DIAGNOSIS — E039 Hypothyroidism, unspecified: Secondary | ICD-10-CM | POA: Diagnosis present

## 2018-03-02 DIAGNOSIS — N390 Urinary tract infection, site not specified: Secondary | ICD-10-CM | POA: Diagnosis present

## 2018-03-02 DIAGNOSIS — K5900 Constipation, unspecified: Secondary | ICD-10-CM | POA: Diagnosis present

## 2018-03-02 DIAGNOSIS — I509 Heart failure, unspecified: Secondary | ICD-10-CM | POA: Diagnosis present

## 2018-03-02 DIAGNOSIS — T82898A Other specified complication of vascular prosthetic devices, implants and grafts, initial encounter: Secondary | ICD-10-CM

## 2018-03-02 DIAGNOSIS — I2510 Atherosclerotic heart disease of native coronary artery without angina pectoris: Secondary | ICD-10-CM | POA: Diagnosis present

## 2018-03-02 DIAGNOSIS — Z888 Allergy status to other drugs, medicaments and biological substances status: Secondary | ICD-10-CM

## 2018-03-02 DIAGNOSIS — Z8701 Personal history of pneumonia (recurrent): Secondary | ICD-10-CM

## 2018-03-02 DIAGNOSIS — Z90711 Acquired absence of uterus with remaining cervical stump: Secondary | ICD-10-CM

## 2018-03-02 DIAGNOSIS — Z794 Long term (current) use of insulin: Secondary | ICD-10-CM

## 2018-03-02 DIAGNOSIS — Z7952 Long term (current) use of systemic steroids: Secondary | ICD-10-CM

## 2018-03-02 LAB — COMPREHENSIVE METABOLIC PANEL
ALT: 19 U/L (ref 0–44)
AST: 50 U/L — ABNORMAL HIGH (ref 15–41)
Albumin: 2.8 g/dL — ABNORMAL LOW (ref 3.5–5.0)
Alkaline Phosphatase: 56 U/L (ref 38–126)
Anion gap: 21 — ABNORMAL HIGH (ref 5–15)
BUN: 12 mg/dL (ref 8–23)
CO2: 15 mmol/L — ABNORMAL LOW (ref 22–32)
Calcium: 8.6 mg/dL — ABNORMAL LOW (ref 8.9–10.3)
Chloride: 90 mmol/L — ABNORMAL LOW (ref 98–111)
Creatinine, Ser: 1.19 mg/dL — ABNORMAL HIGH (ref 0.44–1.00)
GFR, EST AFRICAN AMERICAN: 50 mL/min — AB (ref >60)
GFR, EST NON AFRICAN AMERICAN: 43 mL/min — AB (ref >60)
Glucose, Bld: 288 mg/dL — ABNORMAL HIGH (ref 70–99)
Potassium: 6.2 mmol/L — ABNORMAL HIGH (ref 3.5–5.1)
Sodium: 126 mmol/L — ABNORMAL LOW (ref 135–145)
TOTAL PROTEIN: 6.6 g/dL (ref 6.5–8.1)
Total Bilirubin: 1.7 mg/dL — ABNORMAL HIGH (ref 0.3–1.2)

## 2018-03-02 LAB — BASIC METABOLIC PANEL
Anion gap: 12 (ref 5–15)
BUN: 10 mg/dL (ref 8–23)
CO2: 26 mmol/L (ref 22–32)
Calcium: 8.8 mg/dL — ABNORMAL LOW (ref 8.9–10.3)
Chloride: 90 mmol/L — ABNORMAL LOW (ref 98–111)
Creatinine, Ser: 0.95 mg/dL (ref 0.44–1.00)
GFR calc Af Amer: >60 mL/min (ref >60)
GFR calc non Af Amer: 57 mL/min — ABNORMAL LOW (ref >60)
Glucose, Bld: 211 mg/dL — ABNORMAL HIGH (ref 70–99)
Potassium: 4 mmol/L (ref 3.5–5.1)
Sodium: 128 mmol/L — ABNORMAL LOW (ref 135–145)

## 2018-03-02 LAB — I-STAT CHEM 8, ED
BUN: 16 mg/dL (ref 8–23)
Calcium, Ion: 0.95 mmol/L — ABNORMAL LOW (ref 1.15–1.40)
Chloride: 93 mmol/L — ABNORMAL LOW (ref 98–111)
Creatinine, Ser: 1 mg/dL (ref 0.44–1.00)
Glucose, Bld: 296 mg/dL — ABNORMAL HIGH (ref 70–99)
HCT: 41 % (ref 36.0–46.0)
Hemoglobin: 13.9 g/dL (ref 12.0–15.0)
Potassium: 6.2 mmol/L — ABNORMAL HIGH (ref 3.5–5.1)
Sodium: 123 mmol/L — ABNORMAL LOW (ref 135–145)
TCO2: 25 mmol/L (ref 22–32)

## 2018-03-02 LAB — CBC WITH DIFFERENTIAL/PLATELET
Abs Immature Granulocytes: 0.28 10*3/uL — ABNORMAL HIGH (ref 0.00–0.07)
Basophils Absolute: 0.1 10*3/uL (ref 0.0–0.1)
Basophils Relative: 1 %
Eosinophils Absolute: 0.3 10*3/uL (ref 0.0–0.5)
Eosinophils Relative: 2 %
HCT: 41.8 % (ref 36.0–46.0)
HEMOGLOBIN: 12.7 g/dL (ref 12.0–15.0)
Immature Granulocytes: 2 %
Lymphocytes Relative: 26 %
Lymphs Abs: 3.9 10*3/uL (ref 0.7–4.0)
MCH: 27.2 pg (ref 26.0–34.0)
MCHC: 30.4 g/dL (ref 30.0–36.0)
MCV: 89.5 fL (ref 80.0–100.0)
Monocytes Absolute: 0.8 10*3/uL (ref 0.1–1.0)
Monocytes Relative: 6 %
NRBC: 0 % (ref 0.0–0.2)
Neutro Abs: 9.8 10*3/uL — ABNORMAL HIGH (ref 1.7–7.7)
Neutrophils Relative %: 63 %
Platelets: 330 10*3/uL (ref 150–400)
RBC: 4.67 MIL/uL (ref 3.87–5.11)
RDW: 14.8 % (ref 11.5–15.5)
WBC: 15.2 10*3/uL — ABNORMAL HIGH (ref 4.0–10.5)

## 2018-03-02 LAB — I-STAT ARTERIAL BLOOD GAS, ED
Acid-base deficit: 1 mmol/L (ref 0.0–2.0)
Bicarbonate: 28 mmol/L (ref 20.0–28.0)
O2 Saturation: 95 %
PH ART: 7.259 — AB (ref 7.350–7.450)
PO2 ART: 87 mmHg (ref 83.0–108.0)
Patient temperature: 98.1
TCO2: 30 mmol/L (ref 22–32)
pCO2 arterial: 62.3 mmHg — ABNORMAL HIGH (ref 32.0–48.0)

## 2018-03-02 LAB — GLUCOSE, CAPILLARY
GLUCOSE-CAPILLARY: 177 mg/dL — AB (ref 70–99)
GLUCOSE-CAPILLARY: 141 mg/dL — AB (ref 70–99)
Glucose-Capillary: 197 mg/dL — ABNORMAL HIGH (ref 70–99)
Glucose-Capillary: 220 mg/dL — ABNORMAL HIGH (ref 70–99)
Glucose-Capillary: 223 mg/dL — ABNORMAL HIGH (ref 70–99)

## 2018-03-02 LAB — POTASSIUM: Potassium: 4.5 mmol/L (ref 3.5–5.1)

## 2018-03-02 LAB — I-STAT CG4 LACTIC ACID, ED
LACTIC ACID, VENOUS: 3.37 mmol/L — AB (ref 0.5–1.9)
Lactic Acid, Venous: 5.15 mmol/L (ref 0.5–1.9)

## 2018-03-02 LAB — TROPONIN I: Troponin I: 0.04 ng/mL (ref <0.03)

## 2018-03-02 LAB — BRAIN NATRIURETIC PEPTIDE: B Natriuretic Peptide: 493.1 pg/mL — ABNORMAL HIGH (ref 0.0–100.0)

## 2018-03-02 MED ORDER — METRONIDAZOLE 500 MG PO TABS: 500.0000 mg | ORAL_TABLET | Freq: Three times a day (TID) | ORAL | 0 refills | Status: DC

## 2018-03-02 MED ORDER — NITROGLYCERIN IN D5W 200-5 MCG/ML-% IV SOLN: 0.0000 ug/min | INTRAVENOUS | Status: DC

## 2018-03-02 MED ORDER — SODIUM CHLORIDE 0.9 % IV SOLN
2.0000 g | Freq: Once | INTRAVENOUS | Status: AC
  Administered 2018-03-02: 2 g via INTRAVENOUS
  Filled 2018-03-02: qty 2

## 2018-03-02 MED ORDER — VANCOMYCIN HCL IN DEXTROSE 750-5 MG/150ML-% IV SOLN
750.0000 mg | Freq: Two times a day (BID) | INTRAVENOUS | Status: DC
  Filled 2018-03-02: qty 150

## 2018-03-02 MED ORDER — VANCOMYCIN HCL IN DEXTROSE 1-5 GM/200ML-% IV SOLN: 1000.0000 mg | Freq: Once | INTRAVENOUS | Status: DC

## 2018-03-02 MED ORDER — METRONIDAZOLE 50 MG/ML ORAL SUSPENSION: 500.0000 mg | Freq: Three times a day (TID) | ORAL | 0 refills | Status: DC

## 2018-03-02 MED ORDER — IPRATROPIUM-ALBUTEROL 0.5-2.5 (3) MG/3ML IN SOLN
3.0000 mL | RESPIRATORY_TRACT | Status: DC | PRN
  Administered 2018-03-02 – 2018-03-07 (×3): 3 mL via RESPIRATORY_TRACT
  Filled 2018-03-02 (×3): qty 3

## 2018-03-02 MED ORDER — SCOPOLAMINE 1 MG/3DAYS TD PT72: 1.0000 | MEDICATED_PATCH | TRANSDERMAL | 2 refills | Status: AC

## 2018-03-02 MED ORDER — SODIUM CHLORIDE 0.9 % IV BOLUS
500.0000 mL | Freq: Once | INTRAVENOUS | Status: AC
  Administered 2018-03-02: 500 mL via INTRAVENOUS

## 2018-03-02 MED ORDER — VANCOMYCIN HCL 10 G IV SOLR
2000.0000 mg | Freq: Once | INTRAVENOUS | Status: AC
  Administered 2018-03-02: 2000 mg via INTRAVENOUS
  Filled 2018-03-02: qty 2000

## 2018-03-02 MED ORDER — BUDESONIDE 0.25 MG/2ML IN SUSP: 0.2500 mg | Freq: Two times a day (BID) | RESPIRATORY_TRACT | 2 refills | Status: AC

## 2018-03-02 MED ORDER — SODIUM CHLORIDE 0.9 % IV SOLN
2.0000 g | Freq: Two times a day (BID) | INTRAVENOUS | Status: DC
  Filled 2018-03-02: qty 2

## 2018-03-02 MED ORDER — IPRATROPIUM-ALBUTEROL 0.5-2.5 (3) MG/3ML IN SOLN: 3.0000 mL | RESPIRATORY_TRACT | Status: DC | PRN

## 2018-03-02 MED ORDER — IPRATROPIUM-ALBUTEROL 0.5-2.5 (3) MG/3ML IN SOLN: 3.0000 mL | Freq: Three times a day (TID) | RESPIRATORY_TRACT | 3 refills | Status: AC | PRN

## 2018-03-02 MED ORDER — BISACODYL 10 MG RE SUPP
10.0000 mg | Freq: Once | RECTAL | Status: AC
  Administered 2018-03-02: 10 mg via RECTAL
  Filled 2018-03-02: qty 1

## 2018-03-02 MED ORDER — PREDNISONE 5 MG PO TABS: 5.0000 mg | ORAL_TABLET | Freq: Every day | ORAL | Status: DC

## 2018-03-02 NOTE — Progress Notes (Signed)
Pharmacy Antibiotic Note  Candice Woodward is a 83 y.o. female admitted on 02/26/2018 with SOB/PNA.  Pharmacy has been consulted for Zosyn  Dosing.  Vancomycin discontinued 11/29. Scr stable ~1, WBC 14  Plan: Continue Zosyn 3.375 g IV q8h  Follow-up clinical improvement, LOT, renal function  Height: 5\' 5"  (165.1 cm) Weight: 214 lb 8.1 oz (97.3 kg) IBW/kg (Calculated) : 57  Temp (24hrs), Avg:98.4 F (36.9 C), Min:98.4 F (36.9 C), Max:98.5 F (36.9 C)  Recent Labs  Lab 02/27/18 0011 02/27/18 0018 02/27/18 0303 02/28/18 0728 03/01/18 0729 03/02/18 0233  WBC 14.0*  --   --   --   --   --   CREATININE 1.13*  --   --  1.07* 1.01* 0.95  LATICACIDVEN  --  6.67* 4.07*  --   --   --     Estimated Creatinine Clearance: 54.5 mL/min (by C-G formula based on SCr of 0.95 mg/dL).    Allergies  Allergen Reactions  . Lisinopril Swelling    Antimicrobials this admission: Vanc 11/27 >>11/29 Zosyn 11/27 >>  Dose adjustments this admission:   Microbiology results: 11/27 BCx x 2 >> 1/2 coag neg staph with methicillin resistance - possible contaminant  11/27 UCx >> ng 11/27 MRSA PCR >>neg  Thank you for allowing pharmacy to be a part of this patient's care.  Lenward Chancelloranya Makhlouf, PharmD PGY1 Pharmacy Resident Phone [phone] 03/02/2018 11:41 AM

## 2018-03-02 NOTE — H&P (Signed)
History and Physical   Candice Woodward:[id number] DOB: 1935-03-06 DOA: 03/02/2018  Referring MD/NP/PA: Dr. Verta Ellen  PCP: [name], [place]   Outpatient Specialists: None  Patient coming from: Home  Chief Complaint: Acute respiratory failure  HPI: Candice Woodward is a 83 y.o. female with medical history significant of persistent respiratory failure with aspiration pneumonia, dementia on hospice, diabetes, hypothyroidism, who was just discharged from the hospital today after admission for recurrent aspiration pneumonia.  Patient was discharged on comfort feeding to be followed by hospice.  With an hour of arriving home she became more dyspneic confused and daughter brought her back to the emergency room where she was found to be in acute on chronic hypoxic and hypercarbic respiratory failure.  Patient currently on BiPAP as she is a DNR.  Her pulse ox initially was in the 30s apparently according to EMS.  She is nonverbal.  They put her on nonrebreather back and brought her into the ER.  At this point she is septic with hyperkalemia as well as worsening renal function.  Daughter is confused regarding extent of care.  She does not want to go to full comfort measures at this point but at the same time she is a DNR.  She wants patient to be treated for her acute sepsis.  If she does not respond to treatment she will be amenable to consideration of comfort measures.  She has not been seen by the hospice nurse since she left the hospital.  She is currently unable to give any history as she is on BiPAP and daughter is in tears at the moment..  ED Course: Patient has a temperature of 98 5 on arrival with blood pressure 180/99 pulse 132 respirate 32 oxygen sat 78% on 3 L.  She has a white count of 15,000 hemoglobin 13.9 sodium is 123 potassium 6.2 chloride 93 CO2 15 BUN 16 creatinine 1.0.  Troponin 0 0.04 lactic acid of 3 of 5.15.  Her ABG showed a pH of 7.259 PCO2 of 62.3 with PO2 of 87 on BiPAP.  Chest  x-ray showed worsening bilateral pneumonia suspected aspiration pneumonia.  Review of Systems: As per HPI otherwise 10 point review of systems negative.    Past Medical History:  Diagnosis Date  . Acute bronchitis   . Alzheimer disease ([place])   . Alzheimer's disease ([place])   . Anxiety state, unspecified   . Dementia in conditions classified elsewhere with behavioral disturbance   . Disorder of bone and cartilage, unspecified   . Dizziness and giddiness   . Hypertension   . IBS (irritable bowel syndrome)   . Irritable bowel syndrome   . Lumbago   . Malnutrition of mild degree ([place])   . Obsessive-compulsive disorders   . Other and unspecified hyperlipidemia   . Personal history of urinary (tract) infection   . Rickets, active   . Senile dementia, uncomplicated ([place])   . Thyroid disease     Past Surgical History:  Procedure Laterality Date  . PARTIAL HYSTERECTOMY    . REPLACEMENT TOTAL KNEE     bilateral  . THYROID SURGERY       reports that she has never smoked. Her smokeless tobacco use includes snuff. She reports that she does not drink alcohol or use drugs.  Allergies  Allergen Reactions  . Lisinopril Swelling    Family History  Problem Relation Age of Onset  . Aneurysm Mother        abdominal aortic  . Cancer Father  bone  . Cancer Sister        colon  . Diabetes Sister      Prior to Admission medications   Medication Sig Start Date End Date Taking? Authorizing Provider  albuterol (PROVENTIL) (2.5 MG/3ML) 0.083% nebulizer solution Take 6 mLs (5 mg total) by nebulization 2 (two) times daily as needed for wheezing or shortness of breath. 01/10/18  Yes Emokpae, Courage, MD  budesonide (PULMICORT) 0.25 MG/2ML nebulizer solution Take 2 mLs (0.25 mg total) by nebulization 2 (two) times daily. 03/02/18  Yes Gherghe, Vella Redhead, MD  divalproex (DEPAKOTE SPRINKLE) 125 MG capsule Take 2 capsules (250 mg total) by mouth every 12 (twelve) hours. 01/10/18  Yes  Emokpae, Courage, MD  guaifenesin (ROBITUSSIN) 100 MG/5ML syrup Take 100-200 mg by mouth 3 (three) times daily as needed for cough.   Yes [provider]  insulin lispro (HUMALOG KWIKPEN) 100 UNIT/ML KiwkPen Inject 0.03 mLs (3 Units total) into the skin 3 (three) times daily. Patient taking differently: Inject 0-10 Units into the skin 3 (three) times daily after meals.  08/10/16 03/02/26 Yes Dessa Phi, DO  ipratropium-albuterol (DUONEB) 0.5-2.5 (3) MG/3ML SOLN Inhale 3 mLs into the lungs every 8 (eight) hours as needed. For cough/shortness of breath/congestion 03/02/18  Yes Gherghe, Vella Redhead, MD  levothyroxine (SYNTHROID, LEVOTHROID) 100 MCG tablet TAKE 1 TABLET BY MOUTH FOR THYROID SUPPLEMENT Patient taking differently: Take 100 mcg by mouth daily before breakfast.  06/01/15  Yes Reed, Tiffany L, DO  metroNIDAZOLE (FLAGYL) 500 MG tablet Take 1 tablet (500 mg total) by mouth 3 (three) times daily for 7 days. 03/02/18 03/09/18 Yes Gherghe, Vella Redhead, MD  nystatin cream (MYCOSTATIN) Apply 1 application topically 2 (two) times daily as needed for dry skin.   Yes [provider]  predniSONE (DELTASONE) 5 MG tablet Take 1 tablet (5 mg total) by mouth daily. 03/02/18  Yes Gherghe, Vella Redhead, MD  scopolamine (TRANSDERM-SCOP) 1 MG/3DAYS Place 1 patch (1.5 mg total) onto the skin every 3 (three) days. 03/04/18  Yes Caren Griffins, MD  blood glucose meter kit and supplies Dispense based on patient and insurance preference. Use up to four times daily as directed. (FOR ICD-9 250.00, 250.01). 08/15/16   Rama, Venetia Maxon, MD  insulin glargine (LANTUS) 100 UNIT/ML injection Inject 20 units of Lantus every evening at 10 pm. 08/15/16 02/27/18  Rama, Venetia Maxon, MD  Insulin Pen Needle 31G X 5 MM MISC Use with insulin pens 4 times daily 08/10/16   Dessa Phi, DO    Physical Exam: Vitals:   03/02/18 2045 03/02/18 2100 03/02/18 2115 03/02/18 2130  BP:  101/69 123/77 (!) 145/74  Pulse:  96 98  (!) 101  Resp:  (!) 26 (!) 27 (!) 26  Temp:      TempSrc:      SpO2:  100% 100% 100%  Weight: 95.7 kg     Height: _0  (1.651 m)         Constitutional: Patient in respiratory distress on BiPAP nonverbal Vitals:   03/02/18 2045 03/02/18 2100 03/02/18 2115 03/02/18 2130  BP:  101/69 123/77 (!) 145/74  Pulse:  96 98 (!) 101  Resp:  (!) 26 (!) 27 (!) 26  Temp:      TempSrc:      SpO2:  100% 100% 100%  Weight: 95.7 kg     Height: _1  (1.651 m)      Eyes: PERRL, lids and conjunctivae normal ENMT: Mucous membranes are dry  posterior pharynx clear of any exudate or lesions.Normal dentition.  Neck: normal, supple, no masses, no thyromegaly Respiratory: Decreased air entry bilaterally with marked expiratory wheezing and crackles, use of accessory muscles of respiration. Cardiovascular: Sinus tachycardia no murmurs / rubs / gallops. 1+extremity edema. 2+ pedal pulses. No carotid bruits.  Abdomen: no tenderness, no masses palpated. No hepatosplenomegaly. Bowel sounds positive.  Musculoskeletal: no clubbing / cyanosis. No joint deformity upper and lower extremities. Good ROM, no contractures. Normal muscle tone.  Skin: no rashes, lesions, ulcers. No induration Neurologic: CN 2-12 grossly intact. Sensation intact, DTR normal. Strength 5/5 in all 4.  Psychiatric: Obtunded and nonverbal    Labs on Admission: I have personally reviewed following labs and imaging studies  CBC: Recent Labs  Lab 02/27/18 0011 03/02/18 1949 03/02/18 2003  WBC 14.0* 15.2*  --   NEUTROABS 10.2* 9.8*  --   HGB 13.6 12.7 13.9  HCT 41.9 41.8 41.0  MCV 86.4 89.5  --   PLT 284 330  --    Basic Metabolic Panel: Recent Labs  Lab 02/27/18 0011 02/28/18 0728 03/01/18 0729 03/02/18 0233 03/02/18 1949 03/02/18 2003  NA 120* 126* 127* 128* 126* 123*  K 4.3 3.6 3.7 4.0 6.2* 6.2*  CL 85* 91* 92* 90* 90* 93*  CO2 18* _0 15*  --   GLUCOSE 315* 178* 146* 211* 288* 296*  BUN _1 CREATININE 1.13* 1.07* 1.01* 0.95 1.19* 1.00  CALCIUM 8.4* 8.2* 8.5* 8.8* 8.6*  --    GFR: Estimated Creatinine Clearance: 51.4 mL/min (by C-G formula based on SCr of 1 mg/dL). Liver Function Tests: Recent Labs  Lab 02/27/18 0011 03/02/18 1949  AST 34 50*  ALT 30 19  ALKPHOS 76 56  BILITOT 0.5 1.7*  PROT 7.4 6.6  ALBUMIN 3.2* 2.8*   No results for input(s): LIPASE, AMYLASE in the last 168 hours. No results for input(s): AMMONIA in the last 168 hours. Coagulation Profile: Recent Labs  Lab 02/27/18 0011  INR 1.05   Cardiac Enzymes: Recent Labs  Lab 02/27/18 0011 03/02/18 1949  TROPONINI 0.03* 0.04*   BNP (last 3 results) No results for input(s): PROBNP in the last 8760 hours. HbA1C: No results for input(s): HGBA1C in the last 72 hours. CBG: Recent Labs  Lab 03/02/18 0057 03/02/18 0500 03/02/18 0724 03/02/18 1208 03/02/18 1613  GLUCAP 220* 177* 141* 197* 223*   Lipid Profile: No results for input(s): CHOL, HDL, LDLCALC, TRIG, CHOLHDL, LDLDIRECT in the last 72 hours. Thyroid Function Tests: No results for input(s): TSH, T4TOTAL, FREET4, T3FREE, THYROIDAB in the last 72 hours. Anemia Panel: No results for input(s): VITAMINB12, FOLATE, FERRITIN, TIBC, IRON, RETICCTPCT in the last 72 hours. Urine analysis:    Component Value Date/Time   COLORURINE YELLOW 02/27/2018 0011   APPEARANCEUR HAZY (A) 02/27/2018 0011   LABSPEC 1.015 02/27/2018 0011   PHURINE 5.0 02/27/2018 0011   GLUCOSEU >=500 (A) 02/27/2018 0011   HGBUR MODERATE (A) 02/27/2018 0011   BILIRUBINUR NEGATIVE 02/27/2018 0011   KETONESUR NEGATIVE 02/27/2018 0011   PROTEINUR NEGATIVE 02/27/2018 0011   NITRITE NEGATIVE 02/27/2018 0011   LEUKOCYTESUR NEGATIVE 02/27/2018 0011   Sepsis Labs: _2 (procalcitonin:4,lacticidven:4) ) Recent Results (from the past 240 hour(s))  Blood Culture (routine x 2)     Status: None (Preliminary result)   Collection Time: 02/27/18 12:11 AM  Result Value Ref  Range Status   Specimen Description BLOOD LEFT HAND  Final   Special  Requests   Final    BOTTLES DRAWN AEROBIC AND ANAEROBIC Blood Culture adequate volume   Culture   Final    NO GROWTH 3 DAYS Performed at [facility], [place] [address] [place], [place] [address]    Report Status PENDING  Incomplete  Blood Culture (routine x 2)     Status: Abnormal   Collection Time: 02/27/18 12:11 AM  Result Value Ref Range Status   Specimen Description BLOOD RIGHT WRIST  Final   Special Requests   Final    BOTTLES DRAWN AEROBIC ONLY Blood Culture adequate volume   Culture  Setup Time   Final    GRAM POSITIVE COCCI IN CLUSTERS AEROBIC BOTTLE ONLY Organism ID to follow CRITICAL RESULT CALLED TO, READ BACK BY AND VERIFIED WITH: H.BAIRD,PHARMD AT 2143 ON 02/27/18 BY G.[email or web address]    Culture (A)  Final    STAPHYLOCOCCUS SPECIES (COAGULASE NEGATIVE) THE SIGNIFICANCE OF ISOLATING THIS ORGANISM FROM A SINGLE SET OF BLOOD CULTURES WHEN MULTIPLE SETS ARE DRAWN IS UNCERTAIN. PLEASE NOTIFY THE MICROBIOLOGY DEPARTMENT WITHIN ONE WEEK IF SPECIATION AND SENSITIVITIES ARE REQUIRED. Performed at [facility], [place] [address] [place], [place] [address]    Report Status 03/01/2018 FINAL  Final  Urine culture     Status: None   Collection Time: 02/27/18 12:11 AM  Result Value Ref Range Status   Specimen Description URINE, RANDOM  Final   Special Requests NONE  Final   Culture   Final    NO GROWTH Performed at [facility], [place] [address] [place], [place] [address]    Report Status 02/28/2018 FINAL  Final  Blood Culture ID Panel (Reflexed)     Status: Abnormal   Collection Time: 02/27/18 12:11 AM  Result Value Ref Range Status   Enterococcus species NOT DETECTED NOT DETECTED Final   Listeria monocytogenes NOT DETECTED NOT DETECTED Final   Staphylococcus species DETECTED (A) NOT DETECTED Final    Comment: Methicillin (oxacillin) resistant coagulase negative staphylococcus. Possible blood culture  contaminant (unless isolated from more than one blood culture draw or clinical case suggests pathogenicity). No antibiotic treatment is indicated for blood  culture contaminants. CRITICAL RESULT CALLED TO, READ BACK BY AND VERIFIED WITH: H.BAIRD,PHARMD AT 2143 ON 02/27/18 BY G.MCADOO    Staphylococcus aureus (BCID) NOT DETECTED NOT DETECTED Final   Methicillin resistance DETECTED (A) NOT DETECTED Final    Comment: CRITICAL RESULT CALLED TO, READ BACK BY AND VERIFIED WITH: H.BAIRD,PHARMD AT 2143 ON 02/27/18 BY G.MCADOO    Streptococcus species NOT DETECTED NOT DETECTED Final   Streptococcus agalactiae NOT DETECTED NOT DETECTED Final   Streptococcus pneumoniae NOT DETECTED NOT DETECTED Final   Streptococcus pyogenes NOT DETECTED NOT DETECTED Final   Acinetobacter baumannii NOT DETECTED NOT DETECTED Final   Enterobacteriaceae species NOT DETECTED NOT DETECTED Final   Enterobacter cloacae complex NOT DETECTED NOT DETECTED Final   Escherichia coli NOT DETECTED NOT DETECTED Final   Klebsiella oxytoca NOT DETECTED NOT DETECTED Final   Klebsiella pneumoniae NOT DETECTED NOT DETECTED Final   Proteus species NOT DETECTED NOT DETECTED Final   Serratia marcescens NOT DETECTED NOT DETECTED Final   Haemophilus influenzae NOT DETECTED NOT DETECTED Final   Neisseria meningitidis NOT DETECTED NOT DETECTED Final   Pseudomonas aeruginosa NOT DETECTED NOT DETECTED Final   Candida albicans NOT DETECTED NOT DETECTED Final   Candida glabrata NOT DETECTED NOT DETECTED Final   Candida krusei NOT DETECTED NOT DETECTED Final   Candida parapsilosis  NOT DETECTED NOT DETECTED Final   Candida tropicalis NOT DETECTED NOT DETECTED Final    Comment: Performed at [facility], [place] [address] [place], [place] [address]  MRSA PCR Screening     Status: None   Collection Time: 02/27/18 12:21 PM  Result Value Ref Range Status   MRSA by PCR NEGATIVE NEGATIVE Final    Comment:        The GeneXpert MRSA Assay  (FDA approved for NASAL specimens only), is one component of a comprehensive MRSA colonization surveillance program. It is not intended to diagnose MRSA infection nor to guide or monitor treatment for MRSA infections. Performed at [facility], [place] [address] [place], [place] [address]   Blood Culture (routine x 2)     Status: None (Preliminary result)   Collection Time: 03/02/18  8:22 PM  Result Value Ref Range Status   Specimen Description BLOOD LEFT FOREARM  Final   Special Requests   Final    BOTTLES DRAWN AEROBIC AND ANAEROBIC Blood Culture results may not be optimal due to an inadequate volume of blood received in culture bottles   Culture PENDING  Incomplete   Report Status PENDING  Incomplete     Radiological Exams on Admission: Dg Chest Port 1 View  Result Date: 03/02/2018 CLINICAL DATA:  Shortness of breath. EXAM: PORTABLE CHEST 1 VIEW COMPARISON:  02/27/2018 FINDINGS: Lungs are adequately inflated demonstrate persistent hazy airspace process over the right upper lobe with worsening patchy airspace process over the left mid to lower lung and right base. Possible small amount left pleural fluid. Cardiomediastinal silhouette and remainder of the exam is unchanged. IMPRESSION: Worsening bilateral multifocal airspace process likely pneumonia. Possible small amount left pleural fluid. Electronically Signed   By: Marin Olp M.D.   On: 03/02/2018 20:21    EKG: Independently reviewed.  Sinus tachycardia with a rate of 130.  No significant ST changes  Assessment/Plan Principal Problem:   Sepsis, Gram negative (HCC) Active Problems:   Alzheimer disease (HCC)   Hypothyroidism   Essential hypertension   Acute hypoxemic respiratory failure (HCC)   AKI (acute kidney injury) (HCC)   Hyponatremia   Acute metabolic encephalopathy   Hyperkalemia     #1 severe sepsis: Most likely gram-negative sepsis.  Due to pneumonia and possible UTI.  Check UA.  Start patient back on  vancomycin and cefepime.  Blood cultures obtained.  Admit to stepdown unit on BiPAP.  If no improvement within 24 hours we may have to go full comfort measures.  #2 acute kidney injury: Mild.  Creatinine at 1.0.  Monitor closely.  #3 hyperkalemia: We will give insulin with D50.  Monitor patient's response and potassium levels.  Kayexalate may have to be used but patient cannot take anything p.o. at the moment.  We may direct our attention.  #4 history of hypertension: Blood pressure is currently stable at 140s.  No hypotension yet.  #5 hyponatremia: probably secondary to her current pneumonia.  She appears mildly dehydrated.  Continue with IV fluids.  #6 metabolic encephalopathy: Secondary to sepsis.  Continue close monitoring.  #7 dementia: Patient is currently nonverbal and confused.  But she has end-stage dementia and is on hospice.  Will resume conversation with daughter regarding possible full comfort measures at this point.  #8 acute on chronic hypoxic and hypercarbic respiratory failure: Continue with BiPAP.  Continue underlying treatment of pneumonia.  #9 diabetes: Continue glycemic control.  Sliding scale insulin to be ordered.  DVT prophylaxis: Heparin Code Status: DNR Family Communication: Daughter at bedside Disposition Plan: To be determined Consults called: None Admission status: Inpatient  Severity of Illness: The appropriate patient status for this patient is INPATIENT. Inpatient status is judged to be reasonable and necessary in order to provide the required intensity of service to ensure the patient's safety. The patient's presenting symptoms, physical exam findings, and initial radiographic and laboratory data in the context of their chronic comorbidities is felt to place them at high risk for further clinical deterioration. Furthermore, it is not anticipated that the patient will be medically stable for discharge from the hospital within 2 midnights of admission. The  following factors support the patient status of inpatient.   " The patient's presenting symptoms include shortness of breath. " The worrisome physical exam findings include tachypnea with significant altered mental status. " The initial radiographic and laboratory data are worrisome because of hyperkalemia. " The chronic co-morbidities include diabetes with sepsis.   * I certify that at the point of admission it is my clinical judgment that the patient will require inpatient hospital care spanning beyond 2 midnights from the point of admission due to high intensity of service, high risk for further deterioration and high frequency of surveillance required.Barbette Merino MD [facility] Pager [phone]  If 7PM-7AM, please contact night-coverage [email or web address] Password [email or web address]  03/02/2018, 10:19 PM

## 2018-03-02 NOTE — Progress Notes (Signed)
Pharmacy Antibiotic Note  Candice Woodward is a 83 y.o. female admitted on 03/02/2018 with sepsis.  Pharmacy has been consulted for vancomycin and cefepime dosing. Pt is afebrile and WBC is elevated at 15.2. SCr is WNL and lactic acid is elevated at 5.15.  Plan: Vancomycin 2gm IV x 1 then 750mg  IV Q12H Cefepime 2gm IV Q12H F/u renal fxn, C&S, clinical status and trough at SS     Temp (24hrs), Avg:98.4 F (36.9 C), Min:98.3 F (36.8 C), Max:98.5 F (36.9 C)  Recent Labs  Lab 02/27/18 0011 02/27/18 0018 02/27/18 0303 02/28/18 0728 03/01/18 0729 03/02/18 0233  WBC 14.0*  --   --   --   --   --   CREATININE 1.13*  --   --  1.07* 1.01* 0.95  LATICACIDVEN  --  6.67* 4.07*  --   --   --     Estimated Creatinine Clearance: 54.5 mL/min (by C-G formula based on SCr of 0.95 mg/dL).    Allergies  Allergen Reactions  . Lisinopril Swelling    Antimicrobials this admission: Vanc 11/30>> Cefepime 11/30>>  Dose adjustments this admission: N/A  Microbiology results: Pending  Thank you for allowing pharmacy to be a part of this patient's care.  Noam Franzen, Drake LeachRachel Lynn 03/02/2018 7:58 PM

## 2018-03-02 NOTE — ED Triage Notes (Signed)
Patient seen here earlier for same; returned for increased SOB. Family states patient has been "gasping and gurgling". Patient sats were 30% on room air per EMS. Currently on Cpap.

## 2018-03-02 NOTE — ED Provider Notes (Addendum)
[facility] EMERGENCY DEPARTMENT Provider Note   CSN: [id number] Arrival date & time: 03/02/18  1940   LEVEL 5 CAVEAT - DEMENTIA  History   Chief Complaint Chief Complaint  Patient presents with  . Shortness of Breath    HPI Candice Woodward is a 83 y.o. female.  HPI  83 year old female presents in respiratory distress.  History is very limited as the patient has Alzheimer's and is unresponsive.  The patient's history is obtained by EMS and there is no current family available.  Patient was just discharged and within an hour was at home and getting a bath.  Started all of a sudden become very short of breath and gasping for air.  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  EMS able to get up a little higher with CPAP.  The patient is a DO NOT RESUSCITATE. Family called hospice but no one answered so EMS was called.  Past Medical History:  Diagnosis Date  . Acute bronchitis   . Alzheimer disease ([place])   . Alzheimer's disease ([place])   . Anxiety state, unspecified   . Dementia in conditions classified elsewhere with behavioral disturbance   . Disorder of bone and cartilage, unspecified   . Dizziness and giddiness   . Hypertension   . IBS (irritable bowel syndrome)   . Irritable bowel syndrome   . Lumbago   . Malnutrition of mild degree ([place])   . Obsessive-compulsive disorders   . Other and unspecified hyperlipidemia   . Personal history of urinary (tract) infection   . Rickets, active   . Senile dementia, uncomplicated ([place])   . Thyroid disease     Patient Active Problem List   Diagnosis Date Noted  . Sepsis, Gram negative ([place]) 03/02/2018  . Hyperkalemia 03/02/2018  . Lactic acidosis   . Acute metabolic encephalopathy   . Aspiration pneumonia ([place]) 01/09/2018  . Acute hypoxemic respiratory failure ([place]) 01/09/2018  . AKI (acute kidney injury) ([place]) 01/09/2018  . Hyponatremia 01/09/2018  . New onset type 2  diabetes mellitus ([place]) 08/15/2016  . Decreased oral intake   . Goals of care, counseling/discussion   . Palliative care encounter   . Seizure ([place]) 08/06/2016  . Pressure ulcer of both heels, unstageable ([place]) 07/01/2015  . Urinary and fecal incontinence 01/04/2015  . Essential hypertension 01/04/2015  . Gait instability 01/04/2015  . Poor vision 01/04/2015  . Depression 01/04/2015  . Obsessive compulsive disorder 01/04/2015  . Hypothyroidism 08/08/2013  . Alzheimer disease ([place])   . Irritable bowel syndrome   . Malnutrition of mild degree ([place])   . Thyroid disease   . HTN (hypertension)     Past Surgical History:  Procedure Laterality Date  . PARTIAL HYSTERECTOMY    . REPLACEMENT TOTAL KNEE     bilateral  . THYROID SURGERY       OB History   None      Home Medications    Prior to Admission medications   Medication Sig Start Date End Date Taking? Authorizing Provider  albuterol (PROVENTIL) (2.5 MG/3ML) 0.083% nebulizer solution Take 6 mLs (5 mg total) by nebulization 2 (two) times daily as needed for wheezing or shortness of breath. 01/10/18  Yes Emokpae, Courage, MD  budesonide (PULMICORT) 0.25 MG/2ML nebulizer solution Take 2 mLs (0.25 mg total) by nebulization 2 (two) times daily. 03/02/18  Yes Gherghe, Vella Redhead, MD  divalproex (DEPAKOTE SPRINKLE) 125 MG capsule Take 2 capsules (  250 mg total) by mouth every 12 (twelve) hours. 01/10/18  Yes Emokpae, Courage, MD  guaifenesin (ROBITUSSIN) 100 MG/5ML syrup Take 100-200 mg by mouth 3 (three) times daily as needed for cough.   Yes [provider]  insulin lispro (HUMALOG KWIKPEN) 100 UNIT/ML KiwkPen Inject 0.03 mLs (3 Units total) into the skin 3 (three) times daily. Patient taking differently: Inject 0-10 Units into the skin 3 (three) times daily after meals.  08/10/16 03/02/26 Yes Dessa Phi, DO  ipratropium-albuterol (DUONEB) 0.5-2.5 (3) MG/3ML SOLN Inhale 3 mLs into the lungs every 8 (eight) hours as needed.  For cough/shortness of breath/congestion 03/02/18  Yes Gherghe, Vella Redhead, MD  levothyroxine (SYNTHROID, LEVOTHROID) 100 MCG tablet TAKE 1 TABLET BY MOUTH FOR THYROID SUPPLEMENT Patient taking differently: Take 100 mcg by mouth daily before breakfast.  06/01/15  Yes Reed, Tiffany L, DO  metroNIDAZOLE (FLAGYL) 500 MG tablet Take 1 tablet (500 mg total) by mouth 3 (three) times daily for 7 days. 03/02/18 03/09/18 Yes Gherghe, Vella Redhead, MD  nystatin cream (MYCOSTATIN) Apply 1 application topically 2 (two) times daily as needed for dry skin.   Yes [provider]  predniSONE (DELTASONE) 5 MG tablet Take 1 tablet (5 mg total) by mouth daily. 03/02/18  Yes Gherghe, Vella Redhead, MD  scopolamine (TRANSDERM-SCOP) 1 MG/3DAYS Place 1 patch (1.5 mg total) onto the skin every 3 (three) days. 03/04/18  Yes Caren Griffins, MD  blood glucose meter kit and supplies Dispense based on patient and insurance preference. Use up to four times daily as directed. (FOR ICD-9 250.00, 250.01). 08/15/16   Rama, Venetia Maxon, MD  insulin glargine (LANTUS) 100 UNIT/ML injection Inject 20 units of Lantus every evening at 10 pm. 08/15/16 02/27/18  Rama, Venetia Maxon, MD  Insulin Pen Needle 31G X 5 MM MISC Use with insulin pens 4 times daily 08/10/16   Dessa Phi, DO    Family History Family History  Problem Relation Age of Onset  . Aneurysm Mother        abdominal aortic  . Cancer Father        bone  . Cancer Sister        colon  . Diabetes Sister     Social History Social History   Tobacco Use  . Smoking status: Never Smoker  . Smokeless tobacco: Current User    Types: Snuff  Substance Use Topics  . Alcohol use: No    Alcohol/week: 0.0 standard drinks  . Drug use: No     Allergies   Lisinopril   Review of Systems Review of Systems  Unable to perform ROS: Severe respiratory distress     Physical Exam Updated Vital Signs BP 132/76   Pulse 84   Temp 98.1 F (36.7 C) (Axillary)   Resp 20   Ht  '5\' 5"'$  (1.651 m)   Wt 95.7 kg   SpO2 100%   BMI 35.11 kg/m   Physical Exam  Constitutional: She appears well-developed and well-nourished. She appears ill.  obese  HENT:  Head: Normocephalic and atraumatic.  Right Ear: External ear normal.  Left Ear: External ear normal.  Nose: Nose normal.  Eyes: Right eye exhibits no discharge. Left eye exhibits no discharge.  Cardiovascular: Regular rhythm and normal heart sounds. Tachycardia present.  Pulmonary/Chest: Accessory muscle usage present. Tachypnea noted. She is in respiratory distress. She has wheezes (diffuse, expiratory).  Abdominal: Soft. There is no tenderness.  Musculoskeletal:       Right lower leg: She  exhibits edema.       Left lower leg: She exhibits edema.  Neurological: She is alert.  Awake, but does not track, follow commands or respond to name  Skin: Skin is warm and dry.  Psychiatric: Her mood appears not anxious.  Nursing note and vitals reviewed.    ED Treatments / Results  Labs (all labs ordered are listed, but only abnormal results are displayed) Labs Reviewed  COMPREHENSIVE METABOLIC PANEL - Abnormal; Notable for the following components:      Result Value   Sodium 126 (*)    Potassium 6.2 (*)    Chloride 90 (*)    CO2 15 (*)    Glucose, Bld 288 (*)    Creatinine, Ser 1.19 (*)    Calcium 8.6 (*)    Albumin 2.8 (*)    AST 50 (*)    Total Bilirubin 1.7 (*)    GFR calc non Af Amer 43 (*)    GFR calc Af Amer 50 (*)    Anion gap 21 (*)    All other components within normal limits  CBC WITH DIFFERENTIAL/PLATELET - Abnormal; Notable for the following components:   WBC 15.2 (*)    Neutro Abs 9.8 (*)    Abs Immature Granulocytes 0.28 (*)    All other components within normal limits  BRAIN NATRIURETIC PEPTIDE - Abnormal; Notable for the following components:   B Natriuretic Peptide 493.1 (*)    All other components within normal limits  TROPONIN I - Abnormal; Notable for the following components:    Troponin I 0.04 (*)    All other components within normal limits  I-STAT CG4 LACTIC ACID, ED - Abnormal; Notable for the following components:   Lactic Acid, Venous 5.15 (*)    All other components within normal limits  I-STAT CHEM 8, ED - Abnormal; Notable for the following components:   Sodium 123 (*)    Potassium 6.2 (*)    Chloride 93 (*)    Glucose, Bld 296 (*)    Calcium, Ion 0.95 (*)    All other components within normal limits  I-STAT ARTERIAL BLOOD GAS, ED - Abnormal; Notable for the following components:   pH, Arterial 7.259 (*)    pCO2 arterial 62.3 (*)    All other components within normal limits  I-STAT CG4 LACTIC ACID, ED - Abnormal; Notable for the following components:   Lactic Acid, Venous 3.37 (*)    All other components within normal limits  CULTURE, BLOOD (ROUTINE X 2)  CULTURE, BLOOD (ROUTINE X 2)  URINE CULTURE  POTASSIUM  URINALYSIS, ROUTINE W REFLEX MICROSCOPIC    EKG EKG Interpretation  Date/Time:  Saturday March 02 2018 19:58:18 EST Ventricular Rate:  108 PR Interval:    QRS Duration: 86 QT Interval:  316 QTC Calculation: 424 R Axis:   -30 Text Interpretation:  Sinus tachycardia Probable left atrial enlargement Abnormal R-wave progression, early transition LVH with secondary repolarization abnormality Inferior infarct, old Anterior Q waves, possibly due to LVH no significant change since Feb 27 2018 Confirmed by Sherwood Gambler [phone]) on 03/02/2018 8:03:16 PM   Radiology Dg Chest Port 1 View  Result Date: 03/02/2018 CLINICAL DATA:  Shortness of breath. EXAM: PORTABLE CHEST 1 VIEW COMPARISON:  02/27/2018 FINDINGS: Lungs are adequately inflated demonstrate persistent hazy airspace process over the right upper lobe with worsening patchy airspace process over the left mid to lower lung and right base. Possible small amount left pleural fluid. Cardiomediastinal silhouette and remainder of  the exam is unchanged. IMPRESSION: Worsening bilateral  multifocal airspace process likely pneumonia. Possible small amount left pleural fluid. Electronically Signed   By: Marin Olp M.D.   On: 03/02/2018 20:21    Procedures .Critical Care Performed by: Sherwood Gambler, MD Authorized by: Sherwood Gambler, MD   Critical care provider statement:    Critical care time (minutes):  45   Critical care time was exclusive of:  Separately billable procedures and treating other patients   Critical care was necessary to treat or prevent imminent or life-threatening deterioration of the following conditions:  Respiratory failure and sepsis   Critical care was time spent personally by me on the following activities:  Development of treatment plan with patient or surrogate, discussions with consultants, evaluation of patient's response to treatment, examination of patient, obtaining history from patient or surrogate, ordering and performing treatments and interventions, ordering and review of laboratory studies, ordering and review of radiographic studies, pulse oximetry, re-evaluation of patient's condition, review of old charts and ventilator management   (including critical care time)  Medications Ordered in ED Medications  ipratropium-albuterol (DUONEB) 0.5-2.5 (3) MG/3ML nebulizer solution 3 mL (3 mLs Nebulization Given 03/02/18 2009)  ceFEPIme (MAXIPIME) 2 g in sodium chloride 0.9 % 100 mL IVPB (has no administration in time range)  vancomycin (VANCOCIN) IVPB 750 mg/150 ml premix (has no administration in time range)  ceFEPIme (MAXIPIME) 2 g in sodium chloride 0.9 % 100 mL IVPB (0 g Intravenous Stopped 03/02/18 2237)  vancomycin (VANCOCIN) 2,000 mg in sodium chloride 0.9 % 500 mL IVPB (0 mg Intravenous Stopped 03/02/18 2322)  sodium chloride 0.9 % bolus 500 mL (0 mLs Intravenous Stopped 03/02/18 2237)     Initial Impression / Assessment and Plan / ED Course  I have reviewed the triage vital signs and the nursing notes.  Pertinent labs & imaging  results that were available during my care of the patient were reviewed by me and considered in my medical decision making (see chart for details).     Patient presents with severe respiratory distress. Better on BiPAP although her chronic baseline mental status is pretty poor.  I had long discussion with the daughter shortly after her arrival.  We discussed the patient's severe illness and significant possibility of death.  We discussed options.  Daughter who is the power of attorney does not want aggressive measures such as pressors, intubation, or CPR.  However she does seem to want treatment and is not quite ready for comfort care.  I tried to stress that comfort care is a significant possibility given the patient's severe illness.  With her acute respiratory distress and the chest x-ray findings she was treated broadly for pneumonia.  Initial potassiums are concerning for hyperkalemia but these are hemolyzed.  Recheck is 4.5.  She did drop her pressure after being on the potassium and having better dyspnea control and so she was given a small amount of fluids.  However she is very complicated given possible fluid overload in the setting of septic shock.  Her pressures are stable at this time.  I discussed with the ICU but given likely need for comfort care they are preferring for hospitalist to admit and I have consulted the hospitalist.  Dr. Jonelle Sidle will admit.  Final Clinical Impressions(s) / ED Diagnoses   Final diagnoses:  Acute respiratory failure with hypoxia ([place])  Septic shock [facility])    ED Discharge Orders    None       Sherwood Gambler, MD  03/02/18 2356    Sherwood Gambler, MD 03/02/18 2358

## 2018-03-02 NOTE — Progress Notes (Addendum)
[facility] GIP RN Visit @ 1030  This is a related and covered GIP admission of 02/27/2018 with [facility] diagnosis ofAlzheimer's diseaseper Dr. Barbee ShropshireHertweck. Patient has OOF DNR in home. Family activated EMS after patient appeared to have aspirated and become unresponsive. On arrival by fire the patient oxygen saturation was in the 30's. She was repositioned, placed on NRB with improved saturations. Patient was admitted for aspiration pneumonia.   Day 4 of [facility] GIP.  Visited with patient and daughter, Candice Woodward at bedside. Pt is non-verbal but does respond to voice and kisses from daughter. Pt does not appear to be in any distress.  Candice Woodward states she is ready for her mother to be back at home in her routine.  Plan will be to discharge today with oxygen in place.  Provided support to [place], as she feels "traumatized" by the events that led up to this hospitalization, and we discussed what to do in the event that this situation happens again.  Pt is on 2 lpm [place] O2, Purewick catheter to wall suction with little clear yellow urine in canister.   Goals of care: Return to home with [facility] support.  Needs O2 prior to returning.  This has been ordered.  Will update RNCM when this is in place.   Communication with [facility]: completed  Please use [facility] if ambulance transport is needed at time of discharge.   Please call with any hospice related questions or concerns.  Thank you, Wallis BambergJennifer Woody BSN, RN  [facility] Liaison (listed in [place]) [phone]

## 2018-03-02 NOTE — Discharge Summary (Addendum)
Physician Discharge Summary  Candice Woodward BTY:[id number] DOB: 10/29/1934 DOA: 02/26/2018  PCP: [name], FNP  Admit date: 02/26/2018 Discharge date: 03/02/2018  Admitted From: home Disposition:  home  Recommendations for Outpatient Follow-up:  1. Follow up with PCP in 1-2 weeks  Home Health: none Equipment/Devices: none  Discharge Condition: guarded CODE STATUS: DNR Diet recommendation: comfort feeding  HPI: Per Dr. Elwyn Reach, [name] is an 83 year old lady with multiple comorbidities including advanced dementia on hospice, diabetes mellitus, hypothyroidism coming in with shortness of breath for 1 day. As per daughter patient has been having congestion all day yesterday and she is not able to clear her secretions she is saying that she might have aspirated though she has been making sure to clear her mouth every time she eats.  Patient does have history of aspiration pneumonia and has advanced dementia being bedbound and completely dependent on her daughter and nonverbal.  Patient is on hospice due to her advanced dementia.  No recent history of fever no vomiting no diarrhea.  Patient had what looked like a seizure to the daughter and she called EMS when EMS saw her there she did have pulse unresponsive but her pulse ox was in the 30s so she was put on nonrebreather brought into the emergency room. We are called to assess the patient for further management.  Patient received more than a liter of IV fluid bolus and cefepime and vancomycin.  Hospital Course:  Principal problem Acute hypoxic respiratory failure in the setting of aspiration pneumonia -Patient started on vancomycin and Zosyn on admission, discontinued vancomycin and continued Zosyn alone with clinical improvement. She is afebrile. Cultures without significant growth, she did have 1/2 coag negative staph in the blood which are thought to be contaminant. Underlying dementia and ongoing dysphagia/aspiration  complicate things. Placed on Metronidazole to complete treatment (daughter was concerned about Augmentin due to severe diarrhea in the past due to Augmentin)  Additional problems Chronic kidney disease stage III -Creatinine has been at baseline Hyponatremia -Sodium 120 on admission, likely dehydrated, received IV fluids and slowly improving, hold HCTZ on d/c. Na improving on d/c Goals of care -Extensive discussions with the daughter at bedside regarding current clinical picture.  While she seems to be recovering after this most recent aspiration episode, moving forward things will continue to get worse.  Discussed about the fact that she she probably has silent aspiration of her own secretions as well as food despite the patient receiving excellent care from her daughter.  The daughter seems to be having a degree of anxiety that she is missing things and not doing everything right.  Acknowledges ongoing hospice support at home, however she is not ready for full comfort care or avoiding further hospitalizations.  I have also discussed the case with hospice RN Underlying dementia -Nonverbal, essentially bedbound Acute metabolic encephalopathy -Getting close to baseline per daughter but she is a poor baseline to begin with Diabetes mellitus -continue home regimen  Hypothyroidism -resume synthroid  Discharge Diagnoses:  Active Problems:   Aspiration pneumonia [facility])     Discharge Instructions   Allergies as of 03/02/2018      Reactions   Lisinopril Swelling      Medication List    STOP taking these medications   hydrochlorothiazide 25 MG tablet Commonly known as:  HYDRODIURIL     TAKE these medications   albuterol (2.5 MG/3ML) 0.083% nebulizer solution Commonly known as:  PROVENTIL Take 6 mLs (5 mg total) by nebulization 2 (  two) times daily as needed for wheezing or shortness of breath.   blood glucose meter kit and supplies Dispense based on patient and insurance preference. Use  up to four times daily as directed. (FOR ICD-9 250.00, 250.01).   budesonide 0.25 MG/2ML nebulizer solution Commonly known as:  PULMICORT Take 2 mLs (0.25 mg total) by nebulization 2 (two) times daily.   divalproex 125 MG capsule Commonly known as:  DEPAKOTE SPRINKLE Take 2 capsules (250 mg total) by mouth every 12 (twelve) hours.   guaifenesin 100 MG/5ML syrup Commonly known as:  ROBITUSSIN Take 100-200 mg by mouth 3 (three) times daily as needed for cough.   insulin glargine 100 UNIT/ML injection Commonly known as:  LANTUS Inject 20 units of Lantus every evening at 10 pm. What changed:  Another medication with the same name was removed. Continue taking this medication, and follow the directions you see here.   insulin lispro 100 UNIT/ML KiwkPen Commonly known as:  HUMALOG Inject 0.03 mLs (3 Units total) into the skin 3 (three) times daily. What changed:    how much to take  when to take this   Insulin Pen Needle 31G X 5 MM Misc Use with insulin pens 4 times daily   ipratropium-albuterol 0.5-2.5 (3) MG/3ML Soln Commonly known as:  DUONEB Inhale 3 mLs into the lungs every 8 (eight) hours as needed. For cough/shortness of breath/congestion   levothyroxine 100 MCG tablet Commonly known as:  SYNTHROID, LEVOTHROID TAKE 1 TABLET BY MOUTH FOR THYROID SUPPLEMENT What changed:  See the new instructions.   metroNIDAZOLE 500 MG tablet Commonly known as:  FLAGYL Take 1 tablet (500 mg total) by mouth 3 (three) times daily for 7 days.   nystatin cream Commonly known as:  MYCOSTATIN Apply 1 application topically 2 (two) times daily as needed for dry skin.   predniSONE 5 MG tablet Commonly known as:  DELTASONE Take 1 tablet (5 mg total) by mouth daily.   scopolamine 1 MG/3DAYS Commonly known as:  TRANSDERM-SCOP Place 1 patch (1.5 mg total) onto the skin every 3 (three) days. Start taking on:  03/04/2018            Durable Medical Equipment  (From admission, onward)           Start     Ordered   03/02/18 1054  For home use only DME oxygen  Once    Question Answer Comment  Mode or (Route) Nasal cannula   Liters per Minute 2   Frequency Continuous (stationary and portable oxygen unit needed)   Oxygen delivery system Gas      03/02/18 1053         Follow-up Information    Sheets, Angelique, FNP. Schedule an appointment as soon as possible for a visit in 1 week(s).   Specialty:  Family Medicine Why:  have labs done in a week Contact information: [address] [place] [address] [phone]           Consultations:  None   Procedures/Studies:  Ct Head Wo Contrast  Result Date: 02/27/2018 CLINICAL DATA:  Altered mental status EXAM: CT HEAD WITHOUT CONTRAST TECHNIQUE: Contiguous axial images were obtained from the base of the skull through the vertex without intravenous contrast. COMPARISON:  Head CT 08/06/2016 FINDINGS: Brain: There is no mass, hemorrhage or extra-axial collection. There is severe atrophy with associated ventriculomegaly, unchanged. Severe chronic white matter changes of ischemic microangiopathy. Vascular: No abnormal hyperdensity of the major intracranial arteries or dural venous  sinuses. No intracranial atherosclerosis. Skull: The visualized skull base, calvarium and extracranial soft tissues are normal. Sinuses/Orbits: Left mastoid effusion. Paranasal sinuses are clear. The orbits are normal. IMPRESSION: 1. No acute intracranial abnormality. 2. Severe atrophy and chronic ischemic microangiopathy. Electronically Signed   By: Ulyses Jarred M.D.   On: 02/27/2018 01:26   Dg Chest Port 1 View  Result Date: 02/27/2018 CLINICAL DATA:  Shortness of breath EXAM: PORTABLE CHEST 1 VIEW COMPARISON:  Chest radiograph 01/08/2018 FINDINGS: There are reticular opacities in the right upper lobe, increased from the prior study. Lungs are otherwise clear. Mild cardiomegaly. No pleural effusion or pneumothorax. IMPRESSION: Increased  reticular opacities in the right upper lobe may indicate developing infection. Electronically Signed   By: Ulyses Jarred M.D.   On: 02/27/2018 01:04      Subjective: Non verbal. Alert  Discharge Exam: Vitals:   03/02/18 0740 03/02/18 1612  BP:  136/82  Pulse:  89  Resp:  13  Temp:  98.3 F (36.8 C)  SpO2: 96% 100%    General: Pt is alert, awake, not in acute distress Cardiovascular: RRR, S1/S2 +, no rubs, no gallops Respiratory: CTA bilaterally Abdominal: Soft, NT, ND, bowel sounds + Extremities: no edema, no cyanosis    The results of significant diagnostics from this hospitalization (including imaging, microbiology, ancillary and laboratory) are listed below for reference.     Microbiology: Recent Results (from the past 240 hour(s))  Blood Culture (routine x 2)     Status: None (Preliminary result)   Collection Time: 02/27/18 12:11 AM  Result Value Ref Range Status   Specimen Description BLOOD LEFT HAND  Final   Special Requests   Final    BOTTLES DRAWN AEROBIC AND ANAEROBIC Blood Culture adequate volume   Culture   Final    NO GROWTH 3 DAYS Performed at [facility], [address], [place], [place] [address]    Report Status PENDING  Incomplete  Blood Culture (routine x 2)     Status: Abnormal   Collection Time: 02/27/18 12:11 AM  Result Value Ref Range Status   Specimen Description BLOOD RIGHT WRIST  Final   Special Requests   Final    BOTTLES DRAWN AEROBIC ONLY Blood Culture adequate volume   Culture  Setup Time   Final    GRAM POSITIVE COCCI IN CLUSTERS AEROBIC BOTTLE ONLY Organism ID to follow CRITICAL RESULT CALLED TO, READ BACK BY AND VERIFIED WITH: H.BAIRD,PHARMD AT 2143 ON 02/27/18 BY G.MCADOO    Culture (A)  Final    STAPHYLOCOCCUS SPECIES (COAGULASE NEGATIVE) THE SIGNIFICANCE OF ISOLATING THIS ORGANISM FROM A SINGLE SET OF BLOOD CULTURES WHEN MULTIPLE SETS ARE DRAWN IS UNCERTAIN. PLEASE NOTIFY THE MICROBIOLOGY DEPARTMENT WITHIN ONE WEEK IF  SPECIATION AND SENSITIVITIES ARE REQUIRED. Performed at [facility], [place] [address] [place], [place] [address]    Report Status 03/01/2018 FINAL  Final  Urine culture     Status: None   Collection Time: 02/27/18 12:11 AM  Result Value Ref Range Status   Specimen Description URINE, RANDOM  Final   Special Requests NONE  Final   Culture   Final    NO GROWTH Performed at [facility], [place] [address], [place], [place] [address]    Report Status 02/28/2018 FINAL  Final  Blood Culture ID Panel (Reflexed)     Status: Abnormal   Collection Time: 02/27/18 12:11 AM  Result Value Ref Range Status   Enterococcus species NOT  DETECTED NOT DETECTED Final   Listeria monocytogenes NOT DETECTED NOT DETECTED Final   Staphylococcus species DETECTED (A) NOT DETECTED Final    Comment: Methicillin (oxacillin) resistant coagulase negative staphylococcus. Possible blood culture contaminant (unless isolated from more than one blood culture draw or clinical case suggests pathogenicity). No antibiotic treatment is indicated for blood  culture contaminants. CRITICAL RESULT CALLED TO, READ BACK BY AND VERIFIED WITH: H.BAIRD,PHARMD AT 2143 ON 02/27/18 BY G.MCADOO    Staphylococcus aureus (BCID) NOT DETECTED NOT DETECTED Final   Methicillin resistance DETECTED (A) NOT DETECTED Final    Comment: CRITICAL RESULT CALLED TO, READ BACK BY AND VERIFIED WITH: H.BAIRD,PHARMD AT 2143 ON 02/27/18 BY G.MCADOO    Streptococcus species NOT DETECTED NOT DETECTED Final   Streptococcus agalactiae NOT DETECTED NOT DETECTED Final   Streptococcus pneumoniae NOT DETECTED NOT DETECTED Final   Streptococcus pyogenes NOT DETECTED NOT DETECTED Final   Acinetobacter baumannii NOT DETECTED NOT DETECTED Final   Enterobacteriaceae species NOT DETECTED NOT DETECTED Final   Enterobacter cloacae complex NOT DETECTED NOT DETECTED Final   Escherichia coli NOT DETECTED NOT DETECTED Final   Klebsiella oxytoca NOT DETECTED NOT  DETECTED Final   Klebsiella pneumoniae NOT DETECTED NOT DETECTED Final   Proteus species NOT DETECTED NOT DETECTED Final   Serratia marcescens NOT DETECTED NOT DETECTED Final   Haemophilus influenzae NOT DETECTED NOT DETECTED Final   Neisseria meningitidis NOT DETECTED NOT DETECTED Final   Pseudomonas aeruginosa NOT DETECTED NOT DETECTED Final   Candida albicans NOT DETECTED NOT DETECTED Final   Candida glabrata NOT DETECTED NOT DETECTED Final   Candida krusei NOT DETECTED NOT DETECTED Final   Candida parapsilosis NOT DETECTED NOT DETECTED Final   Candida tropicalis NOT DETECTED NOT DETECTED Final    Comment: Performed at [facility], [place]. [address] [place], [place] [address]  MRSA PCR Screening     Status: None   Collection Time: 02/27/18 12:21 PM  Result Value Ref Range Status   MRSA by PCR NEGATIVE NEGATIVE Final    Comment:        The GeneXpert MRSA Assay (FDA approved for NASAL specimens only), is one component of a comprehensive MRSA colonization surveillance program. It is not intended to diagnose MRSA infection nor to guide or monitor treatment for MRSA infections. Performed at [facility], [place] [address] [place], [place] [address]      Labs: BNP (last 3 results) Recent Labs    01/08/18 2209 02/28/18 0443  BNP 219.2* 130.8*   Basic Metabolic Panel: Recent Labs  Lab 02/27/18 0011 02/28/18 0728 03/01/18 0729 03/02/18 0233  NA 120* 126* 127* 128*  K 4.3 3.6 3.7 4.0  CL 85* 91* 92* 90*  CO2 18* _0 GLUCOSE 315* 178* 146* 211*  BUN _1 CREATININE 1.13* 1.07* 1.01* 0.95  CALCIUM 8.4* 8.2* 8.5* 8.8*   Liver Function Tests: Recent Labs  Lab 02/27/18 0011  AST 34  ALT 30  ALKPHOS 76  BILITOT 0.5  PROT 7.4  ALBUMIN 3.2*   No results for input(s): LIPASE, AMYLASE in the last 168 hours. No results for input(s): AMMONIA in the last 168 hours. CBC: Recent Labs  Lab 02/27/18 0011  WBC 14.0*  NEUTROABS 10.2*  HGB 13.6    HCT 41.9  MCV 86.4  PLT 284   Cardiac Enzymes: Recent Labs  Lab 02/27/18 0011  TROPONINI 0.03*   BNP: Invalid input(s): POCBNP CBG: Recent Labs  Lab 03/01/18 2120 03/02/18 0057 03/02/18 0500 03/02/18 0724 03/02/18 1208  GLUCAP 221* 220* 177* 141* 197*   D-Dimer No results for input(s): DDIMER in the last 72 hours. Hgb A1c No results for input(s): HGBA1C in the last 72 hours. Lipid Profile No results for input(s): CHOL, HDL, LDLCALC, TRIG, CHOLHDL, LDLDIRECT in the last 72 hours. Thyroid function studies No results for input(s): TSH, T4TOTAL, T3FREE, THYROIDAB in the last 72 hours.  Invalid input(s): FREET3 Anemia work up No results for input(s): VITAMINB12, FOLATE, FERRITIN, TIBC, IRON, RETICCTPCT in the last 72 hours. Urinalysis    Component Value Date/Time   COLORURINE YELLOW 02/27/2018 0011   APPEARANCEUR HAZY (A) 02/27/2018 0011   LABSPEC 1.015 02/27/2018 0011   PHURINE 5.0 02/27/2018 0011   GLUCOSEU >=500 (A) 02/27/2018 0011   HGBUR MODERATE (A) 02/27/2018 0011   BILIRUBINUR NEGATIVE 02/27/2018 0011   KETONESUR NEGATIVE 02/27/2018 0011   PROTEINUR NEGATIVE 02/27/2018 0011   NITRITE NEGATIVE 02/27/2018 0011   LEUKOCYTESUR NEGATIVE 02/27/2018 0011   Sepsis Labs Invalid input(s): PROCALCITONIN,  WBC,  LACTICIDVEN   Time coordinating discharge: 45 minutes  SIGNED:  Marzetta Board, MD  [facility] 03/02/2018, 4:17 PM Pager [phone]  If 7PM-7AM, please contact night-coverage [email or web address] Password [email or web address]

## 2018-03-02 NOTE — Care Management Note (Addendum)
Case Management Note  Patient Details  Name: Candice Woodward MRN: [id number] Date of Birth: 03/10/1935  Subjective/Objective:                    Action/Plan:  10:40 Spoke w daughter at bedside to discuss Home Hospice and transportation. She would like home oxygen and transport via [facility]. Spoke w Wallis BambergJennifer Woody RN [facility] [phone] who will set up arrangements for home O2. Victorino DikeJennifer will call me back to let me know when it is arranged so I can tell daughter at bedside who go home to accept it and then I [facility] [phone].  13:15  Spoke w daughter [name] at bedside to confirm she is aware [facility] btwn 3-7. She was and stated that she was leaving after she fed her mo to go to the pharmacy and home to be there to let them set it up. She is to call me when it arrives so that transport can be called. [name]- [phone]- [phone]  16:15  Angelique Blonderenise called to say that O2 has been delivered and patient can return home. Notified Chief Executive OfficerJennifer RN w [facility]. Arranged transport via [facility]. Notified Quarry managerBen RN bedisde that transport has been set up.    Expected Discharge Date:  03/02/18               Expected Discharge Plan:  Home w Hospice Care  In-House Referral:     Discharge planning Services  CM Consult  Post Acute Care Choice:  Durable Medical Equipment, Resumption of Svcs/PTA Provider Choice offered to:  Adult Children  DME Arranged:  Oxygen DME Agency:  [facility]  HH Arranged:    HH Agency:  [facility]  Status of Service:  In process, will continue to follow  If discussed at Long Length of Stay Meetings, dates discussed:    Additional Comments:  Lawerance SabalDebbie Johanna Stafford, RN 03/02/2018, 10:38 AM

## 2018-03-03 DIAGNOSIS — G309 Alzheimer's disease, unspecified: Secondary | ICD-10-CM

## 2018-03-03 DIAGNOSIS — F0280 Dementia in other diseases classified elsewhere without behavioral disturbance: Secondary | ICD-10-CM

## 2018-03-03 DIAGNOSIS — Z515 Encounter for palliative care: Secondary | ICD-10-CM

## 2018-03-03 DIAGNOSIS — J9601 Acute respiratory failure with hypoxia: Secondary | ICD-10-CM

## 2018-03-03 DIAGNOSIS — R627 Adult failure to thrive: Secondary | ICD-10-CM

## 2018-03-03 DIAGNOSIS — E872 Acidosis: Secondary | ICD-10-CM

## 2018-03-03 DIAGNOSIS — A4150 Gram-negative sepsis, unspecified: Principal | ICD-10-CM

## 2018-03-03 LAB — CBC WITH DIFFERENTIAL/PLATELET
Abs Immature Granulocytes: 0.05 10*3/uL (ref 0.00–0.07)
Basophils Absolute: 0 10*3/uL (ref 0.0–0.1)
Basophils Relative: 0 %
Eosinophils Absolute: 0.1 10*3/uL (ref 0.0–0.5)
Eosinophils Relative: 1 %
HCT: 34.1 % — ABNORMAL LOW (ref 36.0–46.0)
Hemoglobin: 10.7 g/dL — ABNORMAL LOW (ref 12.0–15.0)
Immature Granulocytes: 0 %
LYMPHS ABS: 1.4 10*3/uL (ref 0.7–4.0)
Lymphocytes Relative: 12 %
MCH: 27.2 pg (ref 26.0–34.0)
MCHC: 31.4 g/dL (ref 30.0–36.0)
MCV: 86.8 fL (ref 80.0–100.0)
MONOS PCT: 5 %
Monocytes Absolute: 0.6 10*3/uL (ref 0.1–1.0)
NEUTROS PCT: 82 %
Neutro Abs: 9.4 10*3/uL — ABNORMAL HIGH (ref 1.7–7.7)
Platelets: 203 10*3/uL (ref 150–400)
RBC: 3.93 MIL/uL (ref 3.87–5.11)
RDW: 14.6 % (ref 11.5–15.5)
WBC: 11.6 10*3/uL — ABNORMAL HIGH (ref 4.0–10.5)
nRBC: 0 % (ref 0.0–0.2)

## 2018-03-03 LAB — COMPREHENSIVE METABOLIC PANEL
ALT: 14 U/L (ref 0–44)
ANION GAP: 8 (ref 5–15)
AST: 18 U/L (ref 15–41)
Albumin: 2.6 g/dL — ABNORMAL LOW (ref 3.5–5.0)
Alkaline Phosphatase: 42 U/L (ref 38–126)
BUN: 9 mg/dL (ref 8–23)
CO2: 30 mmol/L (ref 22–32)
Calcium: 8.5 mg/dL — ABNORMAL LOW (ref 8.9–10.3)
Chloride: 92 mmol/L — ABNORMAL LOW (ref 98–111)
Creatinine, Ser: 1.02 mg/dL — ABNORMAL HIGH (ref 0.44–1.00)
GFR calc Af Amer: >60 mL/min (ref >60)
GFR calc non Af Amer: 52 mL/min — ABNORMAL LOW (ref >60)
Glucose, Bld: 174 mg/dL — ABNORMAL HIGH (ref 70–99)
Potassium: 4.2 mmol/L (ref 3.5–5.1)
Sodium: 130 mmol/L — ABNORMAL LOW (ref 135–145)
TOTAL PROTEIN: 6 g/dL — AB (ref 6.5–8.1)
Total Bilirubin: 0.6 mg/dL (ref 0.3–1.2)

## 2018-03-03 LAB — PROCALCITONIN: Procalcitonin: 1.87 ng/mL

## 2018-03-03 LAB — GLUCOSE, CAPILLARY
GLUCOSE-CAPILLARY: 176 mg/dL — AB (ref 70–99)
GLUCOSE-CAPILLARY: 165 mg/dL — AB (ref 70–99)
Glucose-Capillary: 159 mg/dL — ABNORMAL HIGH (ref 70–99)
Glucose-Capillary: 154 mg/dL — ABNORMAL HIGH (ref 70–99)

## 2018-03-03 LAB — LACTIC ACID, PLASMA
Lactic Acid, Venous: 1.2 mmol/L (ref 0.5–1.9)
Lactic Acid, Venous: 0.9 mmol/L (ref 0.5–1.9)

## 2018-03-03 LAB — CORTISOL-AM, BLOOD: Cortisol - AM: 11.5 ug/dL (ref 6.7–22.6)

## 2018-03-03 LAB — PROTIME-INR
INR: 1.1
Prothrombin Time: 14.1 seconds (ref 11.4–15.2)

## 2018-03-03 LAB — MRSA PCR SCREENING: MRSA by PCR: NEGATIVE

## 2018-03-03 MED ORDER — SODIUM CHLORIDE 0.9 % IV SOLN
2.0000 g | Freq: Once | INTRAVENOUS | Status: AC
  Administered 2018-03-03: 2 g via INTRAVENOUS
  Filled 2018-03-03: qty 2

## 2018-03-03 MED ORDER — ONDANSETRON HCL 4 MG/2ML IJ SOLN: 4.0000 mg | Freq: Four times a day (QID) | INTRAMUSCULAR | Status: DC | PRN

## 2018-03-03 MED ORDER — BISACODYL 10 MG RE SUPP
10.0000 mg | Freq: Once | RECTAL | Status: DC
  Filled 2018-03-03 (×2): qty 1

## 2018-03-03 MED ORDER — FLEET ENEMA 7-19 GM/118ML RE ENEM
1.0000 | ENEMA | Freq: Once | RECTAL | Status: DC
  Filled 2018-03-03 (×2): qty 1

## 2018-03-03 MED ORDER — INSULIN ASPART 100 UNIT/ML ~~LOC~~ SOLN
0.0000 [IU] | Freq: Every day | SUBCUTANEOUS | Status: DC
  Administered 2018-03-08: 2 [IU] via SUBCUTANEOUS
  Administered 2018-03-09: 3 [IU] via SUBCUTANEOUS

## 2018-03-03 MED ORDER — ONDANSETRON HCL 4 MG PO TABS: 4.0000 mg | ORAL_TABLET | Freq: Four times a day (QID) | ORAL | Status: DC | PRN

## 2018-03-03 MED ORDER — HEPARIN SODIUM (PORCINE) 5000 UNIT/ML IJ SOLN
5000.0000 [IU] | Freq: Three times a day (TID) | INTRAMUSCULAR | Status: DC
  Administered 2018-03-03 – 2018-03-10 (×21): 5000 [IU] via SUBCUTANEOUS
  Filled 2018-03-03 (×20): qty 1

## 2018-03-03 MED ORDER — ORAL CARE MOUTH RINSE
15.0000 mL | Freq: Two times a day (BID) | OROMUCOSAL | Status: DC
  Administered 2018-03-04 – 2018-03-10 (×7): 15 mL via OROMUCOSAL

## 2018-03-03 MED ORDER — SODIUM CHLORIDE 0.9 % IV SOLN
2.0000 g | INTRAVENOUS | Status: DC
  Filled 2018-03-03: qty 2

## 2018-03-03 MED ORDER — SODIUM CHLORIDE 0.9 % IV SOLN: INTRAVENOUS | Status: DC

## 2018-03-03 MED ORDER — DEXTROSE-NACL 5-0.9 % IV SOLN
INTRAVENOUS | Status: DC
  Administered 2018-03-03 – 2018-03-04 (×3): via INTRAVENOUS

## 2018-03-03 MED ORDER — CHLORHEXIDINE GLUCONATE 0.12 % MT SOLN
15.0000 mL | Freq: Two times a day (BID) | OROMUCOSAL | Status: DC
  Administered 2018-03-03 – 2018-03-10 (×14): 15 mL via OROMUCOSAL
  Filled 2018-03-03 (×14): qty 15

## 2018-03-03 MED ORDER — VANCOMYCIN HCL IN DEXTROSE 1-5 GM/200ML-% IV SOLN
1000.0000 mg | Freq: Once | INTRAVENOUS | Status: AC
  Administered 2018-03-03: 1000 mg via INTRAVENOUS
  Filled 2018-03-03: qty 200

## 2018-03-03 MED ORDER — INSULIN ASPART 100 UNIT/ML ~~LOC~~ SOLN
0.0000 [IU] | Freq: Three times a day (TID) | SUBCUTANEOUS | Status: DC
  Administered 2018-03-03 – 2018-03-04 (×4): 2 [IU] via SUBCUTANEOUS
  Administered 2018-03-05 – 2018-03-07 (×5): 1 [IU] via SUBCUTANEOUS
  Administered 2018-03-07 – 2018-03-08 (×2): 2 [IU] via SUBCUTANEOUS
  Administered 2018-03-08: 5 [IU] via SUBCUTANEOUS
  Administered 2018-03-08 – 2018-03-10 (×5): 3 [IU] via SUBCUTANEOUS

## 2018-03-03 NOTE — Progress Notes (Signed)
Pharmacy Antibiotic Note  Candice Woodward is a 83 y.o. female admitted on 03/02/2018 with sepsis.  Pharmacy has been consulted for vancomycin and cefepime dosing. She was just dc yesterday but had to come back due to resp failure. Restart on same regimen from yesterday.   Scr 1.02 CrCl 52. MRSA PCR neg. Dr. Renford DillsAdhikari is ok with dc vanc.   Plan: Dc vanc Cefepime 2gm IV Q24 F/u renal fxn, C&S, clinical status and trough at SS  Height: 5\' 5"  (165.1 cm) Weight: 221 lb 6.4 oz (100.4 kg) IBW/kg (Calculated) : 57  Temp (24hrs), Avg:98.2 F (36.8 C), Min:98.1 F (36.7 C), Max:98.3 F (36.8 C)  Recent Labs  Lab 02/27/18 0011 02/27/18 0018 02/27/18 0303  03/01/18 0729 03/02/18 0233 03/02/18 1949 03/02/18 2003 03/02/18 2004 03/02/18 2217 03/03/18 1002  WBC 14.0*  --   --   --   --   --  15.2*  --   --   --  11.6*  CREATININE 1.13*  --   --    < > 1.01* 0.95 1.19* 1.00  --   --  1.02*  LATICACIDVEN  --  6.67* 4.07*  --   --   --   --   --  5.15* 3.37*  --    < > = values in this interval not displayed.    Estimated Creatinine Clearance: 51.7 mL/min (A) (by C-G formula based on SCr of 1.02 mg/dL (H)).    Allergies  Allergen Reactions  . Lisinopril Swelling    Antimicrobials this admission: Vanc 11/30>> Cefepime 11/30>>  Dose adjustments this admission: N/A  Microbiology results: 11/30 blood>> 12/1 MRSA PCR>>neg  Ulyses SouthwardMinh Pham, PharmD, BCIDP, AAHIVP, CPP Infectious Disease Pharmacist 03/03/2018 3:41 PM

## 2018-03-03 NOTE — Progress Notes (Signed)
[facility] ([facility])  [facility] is aware of readmission.  Detailed note to follow.  Appreciate Palliative Medical Team consult, as the daughter [name]([name]) needs much support with decision making.  Please feel free to call for update prior to meeting with family.  Thank you, Wallis BambergJennifer Woody BSN, RN [facility] Hospital Liaison (listed in [place]) [phone]

## 2018-03-03 NOTE — Consult Note (Signed)
Consultation Note Date: 03/03/2018   Patient Name: Candice Woodward  DOB: 11/13/1934  MRN: [id number]  Age / Sex: 83 y.o., female  PCP: [name], [place] Referring Physician: Shelly Coss, MD  Reason for Consultation: Establishing goals of care and Psychosocial/spiritual support  HPI/Patient Profile: 83 y.o. female   admitted on 03/02/2018 with past  medical history significant for  ES-dementia/ on hospice benefit, persistent respiratory failure with aspiration pneumonia,  diabetes, hypothyroidism, who was just discharged from the hospital today after admission for recurrent aspiration pneumonia.    Patient was discharged on comfort feeding to be followed by hospice.  With an hour of arriving home she became more dyspneic confused and daughter brought her back to the emergency room where she was found to be in acute on chronic hypoxic and hypercarbic respiratory failure.   [name] is requiring intermittent BiPAP for respiratory support, she is a DNR.  Patient has end-stage dementia  with a prognosis of 6 months or less.  Her daughter faces treatment option decisions, advanced directive decisions, and anticipatory care needs.    Clinical Assessment and Goals of Care:   This NP Wadie Lessen reviewed medical records, received report from team, assessed the patient and then meet at the patient's bedside along with her daughter/ Candice Woodward to discuss diagnosis, prognosis, GOC, EOL wishes disposition and options.  Concept of Hospice and Palliative Care were discussed in detail.  A detailed discussion was had today regarding advanced directives.  Concepts specific to code status, artifical feeding and hydration, continued IV antibiotics and rehospitalization was had.  The difference between a aggressive medical intervention path  and a palliative comfort care path for this patient at this time was had.  Values  and goals of care important to patient and family were attempted to be elicited.  Natural trajectory and expectations of dementia and  EOL were discussed.  I discussed the patient's high risk for decompensation and explained that decompensation as actual transition at end of life.  Daughter very clearly and adamantly "I own this" is open to all offered and available medical interventions to prolong life.      Questions and concerns addressed.   Family encouraged to call with questions or concerns.    PMT will continue to support holistically.    NEXT OF KIN/ daughter    SUMMARY OF RECOMMENDATIONS    Code Status/Advance Care Planning:  DNR   Palliative Prophylaxis:   Aspiration, Bowel Regimen, Delirium Protocol, Frequent Pain Assessment and Oral Care   Psycho-social/Spiritual:   Desire for further Chaplaincy support:no  Additional Recommendations: Education on Hospice  Prognosis:   < 6 months  Discharge Planning: Home with Hospice      Primary Diagnoses: Present on Admission: . Sepsis, Gram negative ([place]) . Hypothyroidism . Hyponatremia . Essential hypertension . Alzheimer disease ([place]) . AKI (acute kidney injury) ([place]) . Acute hypoxemic respiratory failure ([place]) . Acute metabolic encephalopathy . Hyperkalemia   I have reviewed the medical record, interviewed the patient and family, and examined the  patient. The following aspects are pertinent.  Past Medical History:  Diagnosis Date  . Acute bronchitis   . Alzheimer disease ([place])   . Alzheimer's disease ([place])   . Anxiety state, unspecified   . Dementia in conditions classified elsewhere with behavioral disturbance   . Disorder of bone and cartilage, unspecified   . Dizziness and giddiness   . Hypertension   . IBS (irritable bowel syndrome)   . Irritable bowel syndrome   . Lumbago   . Malnutrition of mild degree ([place])   . Obsessive-compulsive disorders   . Other and unspecified hyperlipidemia   .  Personal history of urinary (tract) infection   . Rickets, active   . Senile dementia, uncomplicated ([place])   . Thyroid disease    Social History   Socioeconomic History  . Marital status: Widowed    Spouse name: Not on file  . Number of children: Not on file  . Years of education: Not on file  . Highest education level: Not on file  Occupational History  . Not on file  Social Needs  . Financial resource strain: Patient refused  . Food insecurity:    Worry: Patient refused    Inability: Patient refused  . Transportation needs:    Medical: Patient refused    Non-medical: Patient refused  Tobacco Use  . Smoking status: Never Smoker  . Smokeless tobacco: Current User    Types: Snuff  Substance and Sexual Activity  . Alcohol use: No    Alcohol/week: 0.0 standard drinks  . Drug use: No  . Sexual activity: Not Currently    Birth control/protection: Post-menopausal  Lifestyle  . Physical activity:    Days per week: Patient refused    Minutes per session: Patient refused  . Stress: Patient refused  Relationships  . Social connections:    Talks on phone: Patient refused    Gets together: Patient refused    Attends religious service: Patient refused    Active member of club or organization: Patient refused    Attends meetings of clubs or organizations: Patient refused    Relationship status: Patient refused  Other Topics Concern  . Not on file  Social History Narrative  . Not on file   Family History  Problem Relation Age of Onset  . Aneurysm Mother        abdominal aortic  . Cancer Father        bone  . Cancer Sister        colon  . Diabetes Sister    Scheduled Meds: . bisacodyl  10 mg Rectal Once  . heparin  5,000 Units Subcutaneous Q8H  . insulin aspart  0-5 Units Subcutaneous QHS  . insulin aspart  0-9 Units Subcutaneous TID WC  . sodium phosphate  1 enema Rectal Once   Continuous Infusions: . dextrose 5 % and 0.9% NaCl 75 mL/hr at 03/03/18 1048   PRN  Meds:.ipratropium-albuterol, ondansetron **OR** ondansetron (ZOFRAN) IV Medications Prior to Admission:  Prior to Admission medications   Medication Sig Start Date End Date Taking? Authorizing Provider  albuterol (PROVENTIL) (2.5 MG/3ML) 0.083% nebulizer solution Take 6 mLs (5 mg total) by nebulization 2 (two) times daily as needed for wheezing or shortness of breath. 01/10/18  Yes Emokpae, Courage, MD  budesonide (PULMICORT) 0.25 MG/2ML nebulizer solution Take 2 mLs (0.25 mg total) by nebulization 2 (two) times daily. 03/02/18  Yes Gherghe, Vella Redhead, MD  divalproex (DEPAKOTE SPRINKLE) 125 MG capsule Take 2 capsules (250 mg total)  by mouth every 12 (twelve) hours. 01/10/18  Yes Emokpae, Courage, MD  guaifenesin (ROBITUSSIN) 100 MG/5ML syrup Take 100-200 mg by mouth 3 (three) times daily as needed for cough.   Yes [provider]  insulin lispro (HUMALOG KWIKPEN) 100 UNIT/ML KiwkPen Inject 0.03 mLs (3 Units total) into the skin 3 (three) times daily. Patient taking differently: Inject 0-10 Units into the skin 3 (three) times daily after meals.  08/10/16 03/02/26 Yes Dessa Phi, DO  ipratropium-albuterol (DUONEB) 0.5-2.5 (3) MG/3ML SOLN Inhale 3 mLs into the lungs every 8 (eight) hours as needed. For cough/shortness of breath/congestion 03/02/18  Yes Gherghe, Vella Redhead, MD  levothyroxine (SYNTHROID, LEVOTHROID) 100 MCG tablet TAKE 1 TABLET BY MOUTH FOR THYROID SUPPLEMENT Patient taking differently: Take 100 mcg by mouth daily before breakfast.  06/01/15  Yes Reed, Tiffany L, DO  metroNIDAZOLE (FLAGYL) 500 MG tablet Take 1 tablet (500 mg total) by mouth 3 (three) times daily for 7 days. 03/02/18 03/09/18 Yes Gherghe, Vella Redhead, MD  nystatin cream (MYCOSTATIN) Apply 1 application topically 2 (two) times daily as needed for dry skin.   Yes [provider]  predniSONE (DELTASONE) 5 MG tablet Take 1 tablet (5 mg total) by mouth daily. 03/02/18  Yes Gherghe, Vella Redhead, MD  scopolamine  (TRANSDERM-SCOP) 1 MG/3DAYS Place 1 patch (1.5 mg total) onto the skin every 3 (three) days. 03/04/18  Yes Caren Griffins, MD  blood glucose meter kit and supplies Dispense based on patient and insurance preference. Use up to four times daily as directed. (FOR ICD-9 250.00, 250.01). 08/15/16   Rama, Venetia Maxon, MD  insulin glargine (LANTUS) 100 UNIT/ML injection Inject 20 units of Lantus every evening at 10 pm. 08/15/16 02/27/18  Rama, Venetia Maxon, MD  Insulin Pen Needle 31G X 5 MM MISC Use with insulin pens 4 times daily 08/10/16   Dessa Phi, DO   Allergies  Allergen Reactions  . Lisinopril Swelling   Review of Systems  Unable to perform ROS: Acuity of condition    Physical Exam  Constitutional: She appears well-developed. She appears ill. Face mask in place.  Pulmonary/Chest: She has decreased breath sounds. She has rhonchi.  Neurological:  Responsive and unable to follow commands.  Skin: Skin is warm and dry.    Vital Signs: BP 131/78 (BP Location: Right Arm)   Pulse 85   Temp 98.1 F (36.7 C) (Axillary)   Resp 19   Ht _0  (1.651 m)   Wt 100.4 kg   SpO2 100%   BMI 36.84 kg/m  Pain Scale: Faces   Pain Score: 4    SpO2: SpO2: 100 % O2 Device:SpO2: 100 % O2 Flow Rate: .   IO: Intake/output summary: No intake or output data in the 24 hours ending 03/03/18 1540  LBM: Last BM Date: 03/02/18 Baseline Weight: Weight: 95.7 kg Most recent weight: Weight: 100.4 kg     Palliative Assessment/Data: 20 %    Discussed Jennifer/hospice leiason  Time In: 1520 Time Out: 1630 Time Total: 70 minutes Greater than 50%  of this time was spent counseling and coordinating care related to the above assessment and plan.  Signed by: Wadie Lessen, NP   Please contact Palliative Medicine Team phone at [phone] for questions and concerns.  For individual provider: See Shea Evans

## 2018-03-03 NOTE — Progress Notes (Signed)
SLP Cancellation Note  Patient Details Name: Candice Woodward MRN: [id number] DOB: 01/22/1935   Cancelled treatment:       Reason Eval/Treat Not Completed: Medical issues which prohibited therapy; Pt is on BiPAP and not appropriate for POs.  Pt known to SLP service from recent admission, during which time focus was on education/support regarding advancing dementia, deteriorating swallow, and ongoing aspiration.  Stopped by room to speak with daughter, [name], who was not present. D/W RN.   Will follow to determine if SLP involvement is warranted at this time.  Thank you, Lakevia Perris L. Samson Fredericouture, MA CCC/SLP Acute Rehabilitation Services Office number [phone] Pager [phone]  Blenda MountsCouture, Parrie Rasco Laurice 03/03/2018, 1:46 PM

## 2018-03-03 NOTE — Progress Notes (Signed)
PROGRESS NOTE    Candice Woodward  MVH:[id number] DOB: Jan 06, 1935 DOA: 03/02/2018 PCP: Jiles Garter, FNP   Brief Narrative: Patient is 83 year old with past medical history of recurrent aspiration pneumonia, chronic respiratory failure, dementia on hospice, diabetes, was discharged from here 2 days ago and was brought back because of worsening dyspnea.  Patient was managed here for aspiration pneumonia and was discharged on comfort feeding followed by hospice.  On arrival, she was found to have severe lactic acidosis, acute respiratory failure and chest x-ray showed worsening bilateral multifocal pneumonia.  Assessment & Plan:   Principal Problem:   Sepsis, Gram negative (HCC) Active Problems:   Alzheimer disease (HCC)   Hypothyroidism   Essential hypertension   Acute hypoxemic respiratory failure (HCC)   AKI (acute kidney injury) (HCC)   Hyponatremia   Acute metabolic encephalopathy   Hyperkalemia  Severe sepsis:Presented with severe lactic acidosis.  Sepsis secondary to aspiration pneumonia.  Daughter agreeable  on continue antibiotics for now.  Blood cultures have been sent.  Acute on chronic hypoxic/hypercarbic respiratory failure: Started on BiPAP.  Recurrent aspiration pneumonia: Chest x-ray showed bilateral multifocal pneumonia.  Started on broad-spectrum antibiotics.  Currently on BiPAP.  Continue n.p.o. If her respiratory status does not improve, she may be a candidate for residential hospice.  We will discuss with the daughter about that.  We might also initiate full comfort measures here. She was with hospice at home.  We will get palliative evaluation.  We will also consult social worker for residential hospice when appropriate.  Hyperkalemia: Resolved  Acute kidney injury:Resolved  Hyponatremia: Most likely associated with pneumonia.  Continue gentle IV fluids  Metabolic encephalopathy: Associated with pneumonia, electrolyte abnormalities.  Patient is nonverbal on  baseline.  Dementia:Nonverbal and confused at baseline.  Being followed with hospice at home.  Diabetes: Continue sliding-scale insulin for now.         DVT prophylaxis: SCD Code Status: DNR Family Communication: daughter Disposition Plan: Undetermined.  Likely residential hospice    Consultants: None  Procedures:None  Antimicrobials: Vancomycin and cefepime  Subjective: Patient seen and examined at bedside this morning.  Remains nonverbal.  On BiPAP.  Unable to contribute any history.  Objective: Vitals:   03/03/18 0545 03/03/18 0615 03/03/18 0630 03/03/18 0828  BP: 107/70 103/70 114/69 132/65  Pulse: 78   85  Resp: 16 (!) 21 20 (!) 23  Temp:    98.2 F (36.8 C)  TempSrc:    Oral  SpO2: 98%   99%  Weight:    100.4 kg  Height:       No intake or output data in the 24 hours ending 03/03/18 0830 Filed Weights   03/02/18 2045 03/03/18 0828  Weight: 95.7 kg 100.4 kg    Examination:  General exam: In  Mild to moderate distress,obese HEENT:PERRL,Oral mucosa moist, Ear/Nose normal on gross exam Respiratory system: Bilateral decreased air entry, bilateral coarse breathing sounds,crackles Cardiovascular system: S1 & S2 heard, RRR. No JVD, murmurs, rubs, gallops or clicks. Pedal edema. Gastrointestinal system: Abdomen is nondistended, soft and nontender. No organomegaly or masses felt. Normal bowel sounds heard. Central nervous system: Not Alert and oriented.  Extremities: Pedal edema, no clubbing ,no cyanosis, distal peripheral pulses palpable.   Data Reviewed: I have personally reviewed following labs and imaging studies  CBC: Recent Labs  Lab 02/27/18 0011 03/02/18 1949 03/02/18 2003  WBC 14.0* 15.2*  --   NEUTROABS 10.2* 9.8*  --   HGB 13.6 12.7 13.9  HCT  41.9 41.8 41.0  MCV 86.4 89.5  --   PLT 284 330  --    Basic Metabolic Panel: Recent Labs  Lab 02/27/18 0011 02/28/18 0728 03/01/18 0729 03/02/18 0233 03/02/18 1949 03/02/18 2003  03/02/18 2204  NA 120* 126* 127* 128* 126* 123*  --   K 4.3 3.6 3.7 4.0 6.2* 6.2* 4.5  CL 85* 91* 92* 90* 90* 93*  --   CO2 18* 27 26 26  15*  --   --   GLUCOSE 315* 178* 146* 211* 288* 296*  --   BUN 14 10 10 10 12 16   --   CREATININE 1.13* 1.07* 1.01* 0.95 1.19* 1.00  --   CALCIUM 8.4* 8.2* 8.5* 8.8* 8.6*  --   --    GFR: Estimated Creatinine Clearance: 52.7 mL/min (by C-G formula based on SCr of 1 mg/dL). Liver Function Tests: Recent Labs  Lab 02/27/18 0011 03/02/18 1949  AST 34 50*  ALT 30 19  ALKPHOS 76 56  BILITOT 0.5 1.7*  PROT 7.4 6.6  ALBUMIN 3.2* 2.8*   No results for input(s): LIPASE, AMYLASE in the last 168 hours. No results for input(s): AMMONIA in the last 168 hours. Coagulation Profile: Recent Labs  Lab 02/27/18 0011  INR 1.05   Cardiac Enzymes: Recent Labs  Lab 02/27/18 0011 03/02/18 1949  TROPONINI 0.03* 0.04*   BNP (last 3 results) No results for input(s): PROBNP in the last 8760 hours. HbA1C: No results for input(s): HGBA1C in the last 72 hours. CBG: Recent Labs  Lab 03/02/18 0057 03/02/18 0500 03/02/18 0724 03/02/18 1208 03/02/18 1613  GLUCAP 220* 177* 141* 197* 223*   Lipid Profile: No results for input(s): CHOL, HDL, LDLCALC, TRIG, CHOLHDL, LDLDIRECT in the last 72 hours. Thyroid Function Tests: No results for input(s): TSH, T4TOTAL, FREET4, T3FREE, THYROIDAB in the last 72 hours. Anemia Panel: No results for input(s): VITAMINB12, FOLATE, FERRITIN, TIBC, IRON, RETICCTPCT in the last 72 hours. Sepsis Labs: Recent Labs  Lab 02/27/18 0018 02/27/18 0303 03/02/18 2004 03/02/18 2217  LATICACIDVEN 6.67* 4.07* 5.15* 3.37*    Recent Results (from the past 240 hour(s))  Blood Culture (routine x 2)     Status: None (Preliminary result)   Collection Time: 02/27/18 12:11 AM  Result Value Ref Range Status   Specimen Description BLOOD LEFT HAND  Final   Special Requests   Final    BOTTLES DRAWN AEROBIC AND ANAEROBIC Blood Culture  adequate volume   Culture   Final    NO GROWTH 3 DAYS Performed at [facility], [address] [place], [place] [address]    Report Status PENDING  Incomplete  Blood Culture (routine x 2)     Status: Abnormal   Collection Time: 02/27/18 12:11 AM  Result Value Ref Range Status   Specimen Description BLOOD RIGHT WRIST  Final   Special Requests   Final    BOTTLES DRAWN AEROBIC ONLY Blood Culture adequate volume   Culture  Setup Time   Final    GRAM POSITIVE COCCI IN CLUSTERS AEROBIC BOTTLE ONLY Organism ID to follow CRITICAL RESULT CALLED TO, READ BACK BY AND VERIFIED WITH: H.BAIRD,PHARMD AT 2143 ON 02/27/18 BY G.MCADOO    Culture (A)  Final    STAPHYLOCOCCUS SPECIES (COAGULASE NEGATIVE) THE SIGNIFICANCE OF ISOLATING THIS ORGANISM FROM A SINGLE SET OF BLOOD CULTURES WHEN MULTIPLE SETS ARE DRAWN IS UNCERTAIN. PLEASE NOTIFY THE MICROBIOLOGY DEPARTMENT WITHIN ONE WEEK IF SPECIATION AND SENSITIVITIES ARE REQUIRED. Performed at [facility], [address], [place], [place] [address]    Report Status 03/01/2018 FINAL  Final  Urine culture     Status: None   Collection Time: 02/27/18 12:11 AM  Result Value Ref Range Status   Specimen Description URINE, RANDOM  Final   Special Requests NONE  Final   Culture   Final    NO GROWTH Performed at [facility], [address] [place], [place] [address]    Report Status 02/28/2018 FINAL  Final  Blood Culture ID Panel (Reflexed)     Status: Abnormal   Collection Time: 02/27/18 12:11 AM  Result Value Ref Range Status   Enterococcus species NOT DETECTED NOT DETECTED Final   Listeria monocytogenes NOT DETECTED NOT DETECTED Final   Staphylococcus species DETECTED (A) NOT DETECTED Final    Comment: Methicillin (oxacillin) resistant coagulase negative staphylococcus. Possible blood culture contaminant (unless isolated from more than one blood culture draw or clinical case suggests pathogenicity). No antibiotic treatment is  indicated for blood  culture contaminants. CRITICAL RESULT CALLED TO, READ BACK BY AND VERIFIED WITH: H.BAIRD,PHARMD AT 2143 ON 02/27/18 BY G.MCADOO    Staphylococcus aureus (BCID) NOT DETECTED NOT DETECTED Final   Methicillin resistance DETECTED (A) NOT DETECTED Final    Comment: CRITICAL RESULT CALLED TO, READ BACK BY AND VERIFIED WITH: H.BAIRD,PHARMD AT 2143 ON 02/27/18 BY G.MCADOO    Streptococcus species NOT DETECTED NOT DETECTED Final   Streptococcus agalactiae NOT DETECTED NOT DETECTED Final   Streptococcus pneumoniae NOT DETECTED NOT DETECTED Final   Streptococcus pyogenes NOT DETECTED NOT DETECTED Final   Acinetobacter baumannii NOT DETECTED NOT DETECTED Final   Enterobacteriaceae species NOT DETECTED NOT DETECTED Final   Enterobacter cloacae complex NOT DETECTED NOT DETECTED Final   Escherichia coli NOT DETECTED NOT DETECTED Final   Klebsiella oxytoca NOT DETECTED NOT DETECTED Final   Klebsiella pneumoniae NOT DETECTED NOT DETECTED Final   Proteus species NOT DETECTED NOT DETECTED Final   Serratia marcescens NOT DETECTED NOT DETECTED Final   Haemophilus influenzae NOT DETECTED NOT DETECTED Final   Neisseria meningitidis NOT DETECTED NOT DETECTED Final   Pseudomonas aeruginosa NOT DETECTED NOT DETECTED Final   Candida albicans NOT DETECTED NOT DETECTED Final   Candida glabrata NOT DETECTED NOT DETECTED Final   Candida krusei NOT DETECTED NOT DETECTED Final   Candida parapsilosis NOT DETECTED NOT DETECTED Final   Candida tropicalis NOT DETECTED NOT DETECTED Final    Comment: Performed at [facility], [address] [place], [place] [address]  MRSA PCR Screening     Status: None   Collection Time: 02/27/18 12:21 PM  Result Value Ref Range Status   MRSA by PCR NEGATIVE NEGATIVE Final    Comment:        The GeneXpert MRSA Assay (FDA approved for NASAL specimens only), is one component of a comprehensive MRSA colonization surveillance program. It is not intended  to diagnose MRSA infection nor to guide or monitor treatment for MRSA infections. Performed at [facility], [address], [place], [place] [address]   Blood Culture (routine x 2)     Status: None (Preliminary result)   Collection Time: 03/02/18  8:22 PM  Result Value Ref Range Status   Specimen Description BLOOD LEFT FOREARM  Final   Special Requests   Final    BOTTLES DRAWN AEROBIC AND ANAEROBIC Blood Culture results may not be optimal due to an inadequate volume of blood received in culture bottles  Culture PENDING  Incomplete   Report Status PENDING  Incomplete         Radiology Studies: Dg Chest Port 1 View  Result Date: 03/02/2018 CLINICAL DATA:  Shortness of breath. EXAM: PORTABLE CHEST 1 VIEW COMPARISON:  02/27/2018 FINDINGS: Lungs are adequately inflated demonstrate persistent hazy airspace process over the right upper lobe with worsening patchy airspace process over the left mid to lower lung and right base. Possible small amount left pleural fluid. Cardiomediastinal silhouette and remainder of the exam is unchanged. IMPRESSION: Worsening bilateral multifocal airspace process likely pneumonia. Possible small amount left pleural fluid. Electronically Signed   By: Elberta Fortisaniel  Boyle M.D.   On: 03/02/2018 20:21        Scheduled Meds: Continuous Infusions: . ceFEPime (MAXIPIME) IV    . vancomycin       LOS: 1 day    Time spent: 35 mins.More than 50% of that time was spent in counseling and/or coordination of care.      Burnadette PopAmrit Kellsey Sansone, MD [facility] Pager [phone]  If 7PM-7AM, please contact night-coverage [email or web address] Password [email or web address] 03/03/2018, 8:30 AM

## 2018-03-03 NOTE — Progress Notes (Signed)
[id number] GIP RN [phone]@ 1400  This is a related and covered GIP admission of 12/1 with [facility] diagnosis ofAlzheimer's diseaseper Dr. Jamie BrookesMonguilod. Patient has OOF DNR in home. [facility] was at home for approximately one hour, daughter was bathing her and noticed that the pt was turning blue and unresponsive.  EMS was activated, reported SPO2 in the 30s, bipap was initiated and pt was transported to ED.  Pt admitted with acute respiratory failure.  Day 1 of [facility] GIP.  Visited pt at the bedside.  She is responsive per her norm-does not respond to my visit, not interactive, on bipap 50%.  Exchanged report with bedside RN, reports that pt looks better.  Lactic acid on re-admission was 3.37. ABG revealed respiratory acidosis.   Pt receiving:  NS @ 150/hr, maxipime 2g IV BID, vancomycin 1g x 1.  Also received 500cc bolus and briefly ntg infusion.  Duoneb x 1 for PRN.    Pt is on bipap @ 50%, Purewick catheter to wall suction with little clear yellow urine in canister.   Goals of care:  Ongoing.  Goals of care has been approached with Angelique Blonderenise (dtr and caregiver), she is not receptive to proceeding with comfort care, but refusing aggressive care.  She wants to get her mother well enough to return home.  PMT is following as well and assisting with ongoing discussions and GOC.  Spoke with Angelique Blonderenise by phone, she is upset that her mother was discharged.  She was not in a state to discuss further goals of care.   Communication with UEA:[id number]:completed  Transfer summary and medication list placed on chart.  Please use [facility] if ambulance transport is needed at time of discharge.   Please call with any hospice related questions or concerns.   Thank you, Wallis BambergJennifer Woody BSN, RN  [facility] Hospital Liaison (listed in [place]) [phone]

## 2018-03-03 NOTE — ED Notes (Signed)
Pt can go to the floor at 730

## 2018-03-03 NOTE — Progress Notes (Signed)
RT called to adjust patient mask. Patient tolerated well. RT will continue to monitor.

## 2018-03-03 NOTE — Progress Notes (Signed)
Candice Woodward [id number] Admission Data: 03/02/2018 0900 Attending Provider: Burnadette PopAdhikari, Amrit, MD  WUJ:WJXBJYPCP:Sheets, Michaelle CopasAngelique, FNP Consults/ Treatment Team:   Candice Woodward is a 83 y.o. female patient admitted from ED awake, alert & orientated  X 1,  DNR, VSS - Blood pressure 131/78, pulse 85, temperature 98.1 F (36.7 C), temperature source Axillary, resp. rate 19, height 5\' 5"  (1.651 m), weight 100.4 kg, SpO2 100 %.on BiPap, no shortness of breath and no distress noted. Tele # [id number] placed and pt is currently running: NSR.   IV site WDL:  Left FA and Left thumb with a transparent dsg that's clean dry and intact.  Allergies:   Allergies  Allergen Reactions  . Lisinopril Swelling     Past Medical History:  Diagnosis Date  . Acute bronchitis   . Alzheimer disease (HCC)   . Alzheimer's disease (HCC)   . Anxiety state, unspecified   . Dementia in conditions classified elsewhere with behavioral disturbance   . Disorder of bone and cartilage, unspecified   . Dizziness and giddiness   . Hypertension   . IBS (irritable bowel syndrome)   . Irritable bowel syndrome   . Lumbago   . Malnutrition of mild degree (HCC)   . Obsessive-compulsive disorders   . Other and unspecified hyperlipidemia   . Personal history of urinary (tract) infection   . Rickets, active   . Senile dementia, uncomplicated (HCC)   . Thyroid disease     History:  obtained from daughter   Pt and family orientation to unit, room and routine. Information packet given to patient/family and safety video watched.  Admission INP armband ID verified with patient/family, and in place. SR up x 2, fall risk assessment complete with family verbalizing understanding of risks associated with falls. Family/patient verbalizes an understanding of how to use the call bell and to call for help before getting out of bed.  Skin assessed, please see flowsheet.  Will cont to monitor and assist as needed.  Tacey HeapElisha Epperson, RN 03/03/2018 1:03  PM

## 2018-03-03 NOTE — Progress Notes (Signed)
Assisted transport of pt from ED to 5W. No noted respiratory issues at this time.

## 2018-03-04 ENCOUNTER — Inpatient Hospital Stay (HOSPITAL_COMMUNITY)

## 2018-03-04 ENCOUNTER — Inpatient Hospital Stay: Payer: Self-pay

## 2018-03-04 LAB — CBC WITH DIFFERENTIAL/PLATELET
Abs Immature Granulocytes: 0.03 10*3/uL (ref 0.00–0.07)
BASOS ABS: 0 10*3/uL (ref 0.0–0.1)
Basophils Relative: 1 %
Eosinophils Absolute: 0.2 10*3/uL (ref 0.0–0.5)
Eosinophils Relative: 2 %
HCT: 33.7 % — ABNORMAL LOW (ref 36.0–46.0)
Hemoglobin: 10.6 g/dL — ABNORMAL LOW (ref 12.0–15.0)
Immature Granulocytes: 0 %
Lymphocytes Relative: 18 %
Lymphs Abs: 1.3 10*3/uL (ref 0.7–4.0)
MCH: 27.5 pg (ref 26.0–34.0)
MCHC: 31.5 g/dL (ref 30.0–36.0)
MCV: 87.5 fL (ref 80.0–100.0)
Monocytes Absolute: 0.5 10*3/uL (ref 0.1–1.0)
Monocytes Relative: 7 %
NRBC: 0 % (ref 0.0–0.2)
Neutro Abs: 5 10*3/uL (ref 1.7–7.7)
Neutrophils Relative %: 72 %
Platelets: 180 10*3/uL (ref 150–400)
RBC: 3.85 MIL/uL — AB (ref 3.87–5.11)
RDW: 14.5 % (ref 11.5–15.5)
WBC: 7 10*3/uL (ref 4.0–10.5)

## 2018-03-04 LAB — BASIC METABOLIC PANEL
Anion gap: 9 (ref 5–15)
Anion gap: 9 (ref 5–15)
BUN: 6 mg/dL — ABNORMAL LOW (ref 8–23)
BUN: <5 mg/dL — ABNORMAL LOW (ref 8–23)
CO2: 28 mmol/L (ref 22–32)
CO2: 28 mmol/L (ref 22–32)
Calcium: 8.3 mg/dL — ABNORMAL LOW (ref 8.9–10.3)
Calcium: 8.1 mg/dL — ABNORMAL LOW (ref 8.9–10.3)
Chloride: 95 mmol/L — ABNORMAL LOW (ref 98–111)
Chloride: 96 mmol/L — ABNORMAL LOW (ref 98–111)
Creatinine, Ser: 0.91 mg/dL (ref 0.44–1.00)
Creatinine, Ser: 0.91 mg/dL (ref 0.44–1.00)
GFR calc Af Amer: >60 mL/min (ref >60)
GFR calc non Af Amer: 60 mL/min — ABNORMAL LOW (ref >60)
GFR, EST NON AFRICAN AMERICAN: 60 mL/min — AB (ref >60)
GLUCOSE: 163 mg/dL — AB (ref 70–99)
Glucose, Bld: 197 mg/dL — ABNORMAL HIGH (ref 70–99)
Potassium: 3.8 mmol/L (ref 3.5–5.1)
Potassium: 3.8 mmol/L (ref 3.5–5.1)
Sodium: 132 mmol/L — ABNORMAL LOW (ref 135–145)
Sodium: 133 mmol/L — ABNORMAL LOW (ref 135–145)

## 2018-03-04 LAB — CULTURE, BLOOD (ROUTINE X 2)
Culture: NO GROWTH
Special Requests: ADEQUATE

## 2018-03-04 LAB — GLUCOSE, CAPILLARY
Glucose-Capillary: 153 mg/dL — ABNORMAL HIGH (ref 70–99)
Glucose-Capillary: 156 mg/dL — ABNORMAL HIGH (ref 70–99)
Glucose-Capillary: 170 mg/dL — ABNORMAL HIGH (ref 70–99)
Glucose-Capillary: 186 mg/dL — ABNORMAL HIGH (ref 70–99)

## 2018-03-04 MED ORDER — MINERAL OIL RE ENEM
1.0000 | ENEMA | Freq: Once | RECTAL | Status: AC
  Administered 2018-03-04: 1 via RECTAL
  Filled 2018-03-04 (×2): qty 1

## 2018-03-04 MED ORDER — SCOPOLAMINE 1 MG/3DAYS TD PT72
1.0000 | MEDICATED_PATCH | TRANSDERMAL | Status: DC
  Administered 2018-03-04 – 2018-03-07 (×2): 1.5 mg via TRANSDERMAL
  Filled 2018-03-04 (×3): qty 1

## 2018-03-04 MED ORDER — SODIUM CHLORIDE 0.9 % IV SOLN
2.0000 g | Freq: Two times a day (BID) | INTRAVENOUS | Status: DC
  Administered 2018-03-04 – 2018-03-06 (×5): 2 g via INTRAVENOUS
  Filled 2018-03-04 (×5): qty 2

## 2018-03-04 MED ORDER — SODIUM CHLORIDE 0.9% FLUSH: 10.0000 mL | INTRAVENOUS | Status: DC | PRN

## 2018-03-04 MED ORDER — BISACODYL 10 MG RE SUPP
10.0000 mg | Freq: Once | RECTAL | Status: AC
  Administered 2018-03-04: 10 mg via RECTAL
  Filled 2018-03-04: qty 1

## 2018-03-04 MED ORDER — MORPHINE SULFATE (PF) 2 MG/ML IV SOLN
2.0000 mg | INTRAVENOUS | Status: DC | PRN
  Administered 2018-03-04 – 2018-03-09 (×2): 2 mg via INTRAVENOUS
  Filled 2018-03-04 (×2): qty 1

## 2018-03-04 NOTE — Evaluation (Signed)
Clinical/Bedside Swallow Evaluation Patient Details  Name: Candice Woodward MRN: [id number] Date of Birth: 26-Sep-1934  Today's Date: 03/04/2018 Time: SLP Start Time (ACUTE ONLY): 1444 SLP Stop Time (ACUTE ONLY): 1509 SLP Time Calculation (min) (ACUTE ONLY): 25 min  Past Medical History:  Past Medical History:  Diagnosis Date  . Acute bronchitis   . Alzheimer disease (HCC)   . Alzheimer's disease (HCC)   . Anxiety state, unspecified   . Dementia in conditions classified elsewhere with behavioral disturbance   . Disorder of bone and cartilage, unspecified   . Dizziness and giddiness   . Hypertension   . IBS (irritable bowel syndrome)   . Irritable bowel syndrome   . Lumbago   . Malnutrition of mild degree (HCC)   . Obsessive-compulsive disorders   . Other and unspecified hyperlipidemia   . Personal history of urinary (tract) infection   . Rickets, active   . Senile dementia, uncomplicated (HCC)   . Thyroid disease    Past Surgical History:  Past Surgical History:  Procedure Laterality Date  . PARTIAL HYSTERECTOMY    . REPLACEMENT TOTAL KNEE     bilateral  . THYROID SURGERY     HPI:  83 y.o. female with PMHx of severe Alzheimer's dementia, cared for at home by her daughter, admitted after witnessed aspiration event.  Pt put on NRB, abx, and per chart, is approaching baseline today.  Pt is being followed by home hospice.  She has a hx of chronic dysphagia.  She was last seen by our SLP service 08/08/16 - at that time, pt's daughter was managing strategies for swallowing/feeding quite well, and had good understanding of the impact of dementia on swallowing.    Assessment / Plan / Recommendation Clinical Impression  Pt is lethargic this afternoon and only takes in one sip of thin liquids via straw. An immediate "gurgling" sound was noted, followed by an increase in RR. Pt would not take in any other sips. Her daughter is concerned that she is not ready to swallow as her mentation is  altered from baseline. We discussed the risks of aspiration, which can increase as underlying dementia progresses. Recommend maintaining NPO status for today with f/u to assess for readiness to start additional POs, which she has previously done with daughter accepting the risks of aspiration and focusing instead on careful feeding strategies. SLP Visit Diagnosis: Dysphagia, unspecified (R13.10)    Aspiration Risk  Moderate aspiration risk    Diet Recommendation NPO   Medication Administration: Via alternative means    Other  Recommendations Oral Care Recommendations: Oral care QID   Follow up Recommendations 24 hour supervision/assistance      Frequency and Duration            Prognosis Prognosis for Safe Diet Advancement: Fair Barriers to Reach Goals: Cognitive deficits      Swallow Study   General HPI: 83 y.o. female with PMHx of severe Alzheimer's dementia, cared for at home by her daughter, admitted after witnessed aspiration event.  Pt put on NRB, abx, and per chart, is approaching baseline today.  Pt is being followed by home hospice.  She has a hx of chronic dysphagia.  She was last seen by our SLP service 08/08/16 - at that time, pt's daughter was managing strategies for swallowing/feeding quite well, and had good understanding of the impact of dementia on swallowing.  Type of Study: Bedside Swallow Evaluation Previous Swallow Assessment: see hpi Diet Prior to this Study: NPO Temperature Spikes  Noted: No Respiratory Status: Nasal cannula History of Recent Intubation: No Behavior/Cognition: Lethargic/Drowsy;Doesn't follow directions Oral Cavity Assessment: (UTA) Oral Care Completed by SLP: No Oral Cavity - Dentition: Edentulous Self-Feeding Abilities: Total assist Patient Positioning: Upright in bed Baseline Vocal Quality: Not observed Volitional Cough: Cognitively unable to elicit Volitional Swallow: Unable to elicit    Oral/Motor/Sensory Function Overall Oral  Motor/Sensory Function: (not able to follow commands for direct assessment)   Ice Chips Ice chips: Not tested   Thin Liquid Thin Liquid: Impaired Presentation: Straw Pharyngeal  Phase Impairments: Change in Vital Signs    Nectar Thick Nectar Thick Liquid: Not tested   Honey Thick Honey Thick Liquid: Not tested   Puree Puree: Not tested   Solid     Solid: Not tested      [name], Rosaisela Jamroz 03/04/2018,3:29 PM  [name], M.A. CCC-SLP Acute Herbalist[facility]abilitation Services Pager [phone] Office [phone]

## 2018-03-04 NOTE — Progress Notes (Signed)
Consent for PICC insertion obtain from daughter Candice Woodward.

## 2018-03-04 NOTE — Significant Event (Signed)
Rapid Response Event Note  Overview: Time Called: 1830 Arrival Time: 1835 Event Type: Respiratory  Initial Focused Assessment: Per RN patient has been tolerating [place] most of the day.  After laying her flat and rolling her on her side she had respiratory distress.  O2 sats decreased to 50s. Daughter at bedside  Interventions: RT at bedside NTS then placed patient on bipap 16/6 100% O2 sats improved rapidly but patient was still labored RR 30s BP 148/82  ST 120s  RR 33 O2 sat 99% on Bipap [facility] PRN morphine ordered for respiratory distress Scopolamine patch reordered PCXR ordered  Plan of Care (if not transferred): Will follow up RN to call if assistance needed  Event Summary: Name of Physician Notified: Adhikari at 1830    at    Outcome: Stayed in room and stabalized  Event End Time: 1852  Marcellina MillinLayton, Kaiesha Tonner

## 2018-03-04 NOTE — Progress Notes (Signed)
Pharmacy Antibiotic Note  Candice Woodward is a 83 y.o. female admitted on 03/02/2018 with sepsis.  Pharmacy has been consulted for cefepime dosing. She was just dc yesterday but had to come back due to resp failure.  Scr 0.91 CrCl 57.   Plan: Increase Cefepime 2gm IV Q12 F/u renal fxn, C&S, clinical status and trough at SS  Height: 5\' 5"  (165.1 cm) Weight: 221 lb 6.4 oz (100.4 kg) IBW/kg (Calculated) : 57  Temp (24hrs), Avg:98.1 F (36.7 C), Min:98.1 F (36.7 C), Max:98.1 F (36.7 C)  Recent Labs  Lab 02/27/18 0011  02/27/18 0303  03/02/18 0233 03/02/18 1949 03/02/18 2003 03/02/18 2004 03/02/18 2217 03/03/18 1002 03/03/18 1908 03/03/18 2146 03/04/18 0640  WBC 14.0*  --   --   --   --  15.2*  --   --   --  11.6*  --   --  7.0  CREATININE 1.13*  --   --    < > 0.95 1.19* 1.00  --   --  1.02*  --   --  0.91  LATICACIDVEN  --    < > 4.07*  --   --   --   --  5.15* 3.37*  --  1.2 0.9  --    < > = values in this interval not displayed.    Estimated Creatinine Clearance: 57.9 mL/min (by C-G formula based on SCr of 0.91 mg/dL).    Allergies  Allergen Reactions  . Lisinopril Swelling    Antimicrobials this admission: Vanc 11/30>>12/1 Cefepime 11/30>>  Dose adjustments this admission: N/A  Microbiology results: 11/30 blood>> 12/1 MRSA PCR>>neg  Praneeth Bussey A. Jeanella CrazePierce, PharmD, BCPS Clinical Pharmacist [place] Pager: [phone] Please utilize Amion for appropriate phone number to reach the unit pharmacist [facility])   03/04/2018 9:54 AM

## 2018-03-04 NOTE — Progress Notes (Signed)
Called to room by RN for decreased O2 sats and increased work of breathing.  Sats reading in the 40's when I got to the room.  Placed on Bipap at 100%.  O2 sats now 99% pt still in visible distress.  NT suction done minimal secretions.  Rapid response nurse also at beside.  RT will continue to monitor.

## 2018-03-04 NOTE — Progress Notes (Signed)
[facility] [phone] [facility] ([facility]) GIP RN Visit@0900   This is a related and covered GIP admission of 12/1 with [facility] diagnosis ofAlzheimer's diseaseper Dr. Jamie BrookesMonguilod. Patient has OOF DNR in home. [facility], was at home for approximately one hour, daughter was bathing her and noticed that the pt was turning blue and unresponsive.  EMS was activated, reported SPO2 in the 30s, bipap was initiated and pt was transported to ED.  Pt admitted with acute respiratory failure.  Day2of [facility] GIP.  Visited pt and daughter [name] at the bedside.  Much time spent with [name] attempting to discuss goals of care, and clarifying code status.  [name] states that she feels overwhelmed and that her mother is not getting treatment because she is under hospice services.  Re-educated her that she is being treated with bipap, antibiotics, speech evaluation etc.  Re-educated her what the current code status means, and she agrees with DNR, however she still feels like if her mother is a DNR it means she will not be treated for her current illness.  Attempted to help interpret medical information to [facility] to help calm her fears.  Pt receiving:  D5NS@ 75, maxipime 2g IV BID.  No PRNs throughout the evening.  Pt transitioned from bipap to [place] @ 5lpm, tolerated well with HR maintaining 80's, SPO2 100% and RR 18.  Pt appeared to be comfortable and in no distress.  Plan is to have speech evaluate swallowing again, disimpaction and possible KUB.  Per Angelique Blonderenise, no significant bowel movement since Tuesday, and she is concerned with this as well.  Goals of care: Ongoing.  Goals of care has been approached with Angelique Blonderenise (dtr and caregiver), she is not receptive to proceeding with comfort care, but refusing aggressive care.  She wants to get her mother well enough to return home.  Even if her mother does not return to baseline, she wants to take her home.  She would not want residential  hospice.  PMT is following as well and assisting with ongoing discussions and GOC.  Communication with UXL:[id number]:completed  Please use [facility] for ambulance transport at time of discharge.   Please call with any hospice related questions or concerns.   Thank you, Wallis BambergJennifer Woody BSN, RN  [facility] Liaison ([place] in [place]) [phone]

## 2018-03-04 NOTE — Progress Notes (Signed)
PROGRESS NOTE    Candice Woodward  ZOX:[id number] DOB: 1934-11-20 DOA: 03/02/2018 PCP: Jiles Garter, FNP   Brief Narrative: Patient is 83 year old with past medical history of recurrent aspiration pneumonia, chronic respiratory failure, dementia on hospice, diabetes, was discharged from here 2 days ago and was brought back because of worsening dyspnea.  Patient was managed here for aspiration pneumonia and was discharged on comfort feeding followed by hospice.  On arrival, she was found to have severe lactic acidosis, acute respiratory failure and chest x-ray showed worsening bilateral multifocal pneumonia.  Currently she is being managed for acute respiratory failure secondary to multifocal pneumonia.  Palliative care has been consulted.  Assessment & Plan:   Principal Problem:   Sepsis, Gram negative (HCC) Active Problems:   Alzheimer disease (HCC)   Hypothyroidism   Essential hypertension   Acute hypoxemic respiratory failure (HCC)   AKI (acute kidney injury) (HCC)   Hyponatremia   Acute metabolic encephalopathy   Hyperkalemia  Severe sepsis:Presented with severe lactic acidosis.  Sepsis secondary to aspiration pneumonia. .  Blood cultures have been sent.  Acute on chronic hypoxic/hypercarbic respiratory failure: Secondary to bilateral multifocal pneumonia.  Started on BiPAP on admission.  We have weaned off BiPAP today and put her on nasal cannula.  Recurrent aspiration pneumonia: Chest x-ray showed bilateral multifocal pneumonia.  Started on broad-spectrum antibiotics.  Currently off BiPAP.  Anticipating speech therapies reevaluation for initiation of feeding. She was with hospice at home.  We will get palliative evaluation.   Hyperkalemia: Resolved  Acute kidney injury:Resolved  Hyponatremia:Mild.Continue current  gentle IV fluids  Metabolic encephalopathy: Associated with pneumonia, metabolic encephalopathy.  Patient is nonverbal on baseline.  Dementia:Nonverbal and  confused at baseline.  Being followed with hospice at home.  Diabetes: Continue sliding-scale insulin for now.  Constipation: We will try bowel regimens.  If it does not result in bowel movement, will order KUB.  She might need disimpaction if KUB shows constipation.   Advanced age/multiple comorbidities/poor prognosis: We have discussed extensively with the daughter at the bedside daily.Patient is DNR.  Daughter still anticipates full treatment and says her goal is to take her mother home. She denies residential hospice or comfort care.      DVT prophylaxis: SCD Code Status: DNR Family Communication: daughter Disposition Plan: Undetermined at this point.   Consultants: None  Procedures:None  Antimicrobials: Cefepime day 2  Subjective: Patient seen and examined at bedside this morning.  Remains nonverbal.  On BiPAP.  Unable to contribute any history.  CT does not in any kind of distress though.  Daughter was present at the bedside.  Objective: Vitals:   03/04/18 1009 03/04/18 1051 03/04/18 1200 03/04/18 1251  BP:   (!) 150/94   Pulse: 86 75 79 80  Resp: (!) 25 (!) 21 (!) 22 (!) 27  Temp:   99 F (37.2 C)   TempSrc: Oral  Axillary   SpO2: 100% 100% 100% 99%  Weight:      Height:        Intake/Output Summary (Last 24 hours) at 03/04/2018 1313 Last data filed at 03/04/2018 0900 Gross per 24 hour  Intake 1498.61 ml  Output 400 ml  Net 1098.61 ml   Filed Weights   03/02/18 2045 03/03/18 0828  Weight: 95.7 kg 100.4 kg    Examination:  General exam: Chronically ill looking, obese HEENT:PERRL,Oral mucosa moist, Ear/Nose normal on gross exam Respiratory system: Bilateral decreased air entry, bilateral coarse breathing sounds,crackles Cardiovascular system: S1 & S2  heard, RRR. No JVD, murmurs, rubs, gallops or clicks. Pedal edema. Gastrointestinal system: Abdomen is distended, soft and nontender. No organomegaly or masses felt. Normal bowel sounds heard. Central nervous  system: Not Alert and oriented.  Extremities: Pedal edema, no clubbing ,no cyanosis, distal peripheral pulses palpable.   Data Reviewed: I have personally reviewed following labs and imaging studies  CBC: Recent Labs  Lab 02/27/18 0011 03/02/18 1949 03/02/18 2003 03/03/18 1002 03/04/18 0640  WBC 14.0* 15.2*  --  11.6* 7.0  NEUTROABS 10.2* 9.8*  --  9.4* 5.0  HGB 13.6 12.7 13.9 10.7* 10.6*  HCT 41.9 41.8 41.0 34.1* 33.7*  MCV 86.4 89.5  --  86.8 87.5  PLT 284 330  --  203 180   Basic Metabolic Panel: Recent Labs  Lab 03/01/18 0729 03/02/18 0233 03/02/18 1949 03/02/18 2003 03/02/18 2204 03/03/18 1002 03/04/18 0640  NA 127* 128* 126* 123*  --  130* 132*  K 3.7 4.0 6.2* 6.2* 4.5 4.2 3.8  CL 92* 90* 90* 93*  --  92* 95*  CO2 26 26 15*  --   --  30 28  GLUCOSE 146* 211* 288* 296*  --  174* 197*  BUN 10 10 12 16   --  9 6*  CREATININE 1.01* 0.95 1.19* 1.00  --  1.02* 0.91  CALCIUM 8.5* 8.8* 8.6*  --   --  8.5* 8.3*   GFR: Estimated Creatinine Clearance: 57.9 mL/min (by C-G formula based on SCr of 0.91 mg/dL). Liver Function Tests: Recent Labs  Lab 02/27/18 0011 03/02/18 1949 03/03/18 1002  AST 34 50* 18  ALT 30 19 14   ALKPHOS 76 56 42  BILITOT 0.5 1.7* 0.6  PROT 7.4 6.6 6.0*  ALBUMIN 3.2* 2.8* 2.6*   No results for input(s): LIPASE, AMYLASE in the last 168 hours. No results for input(s): AMMONIA in the last 168 hours. Coagulation Profile: Recent Labs  Lab 02/27/18 0011 03/03/18 1002  INR 1.05 1.10   Cardiac Enzymes: Recent Labs  Lab 02/27/18 0011 03/02/18 1949  TROPONINI 0.03* 0.04*   BNP (last 3 results) No results for input(s): PROBNP in the last 8760 hours. HbA1C: No results for input(s): HGBA1C in the last 72 hours. CBG: Recent Labs  Lab 03/03/18 1233 03/03/18 1724 03/03/18 2156 03/04/18 0751 03/04/18 1136  GLUCAP 176* 154* 165* 186* 153*   Lipid Profile: No results for input(s): CHOL, HDL, LDLCALC, TRIG, CHOLHDL, LDLDIRECT in the last  72 hours. Thyroid Function Tests: No results for input(s): TSH, T4TOTAL, FREET4, T3FREE, THYROIDAB in the last 72 hours. Anemia Panel: No results for input(s): VITAMINB12, FOLATE, FERRITIN, TIBC, IRON, RETICCTPCT in the last 72 hours. Sepsis Labs: Recent Labs  Lab 03/02/18 2004 03/02/18 2217 03/03/18 1002 03/03/18 1908 03/03/18 2146  PROCALCITON  --   --  1.87  --   --   LATICACIDVEN 5.15* 3.37*  --  1.2 0.9    Recent Results (from the past 240 hour(s))  Blood Culture (routine x 2)     Status: None   Collection Time: 02/27/18 12:11 AM  Result Value Ref Range Status   Specimen Description BLOOD LEFT HAND  Final   Special Requests   Final    BOTTLES DRAWN AEROBIC AND ANAEROBIC Blood Culture adequate volume   Culture   Final    NO GROWTH 5 DAYS Performed at [facility], [address] [place], [place] [address]    Report Status 03/04/2018 FINAL  Final  Blood Culture (routine x 2)  Status: Abnormal   Collection Time: 02/27/18 12:11 AM  Result Value Ref Range Status   Specimen Description BLOOD RIGHT WRIST  Final   Special Requests   Final    BOTTLES DRAWN AEROBIC ONLY Blood Culture adequate volume   Culture  Setup Time   Final    GRAM POSITIVE COCCI IN CLUSTERS AEROBIC BOTTLE ONLY Organism ID to follow CRITICAL RESULT CALLED TO, READ BACK BY AND VERIFIED WITH: H.BAIRD,PHARMD AT 2143 ON 02/27/18 BY G.MCADOO    Culture (A)  Final    STAPHYLOCOCCUS SPECIES (COAGULASE NEGATIVE) THE SIGNIFICANCE OF ISOLATING THIS ORGANISM FROM A SINGLE SET OF BLOOD CULTURES WHEN MULTIPLE SETS ARE DRAWN IS UNCERTAIN. PLEASE NOTIFY THE MICROBIOLOGY DEPARTMENT WITHIN ONE WEEK IF SPECIATION AND SENSITIVITIES ARE REQUIRED. Performed at [facility], [address], [place], [place] [address]    Report Status 03/01/2018 FINAL  Final  Urine culture     Status: None   Collection Time: 02/27/18 12:11 AM  Result Value Ref Range Status   Specimen Description URINE, RANDOM  Final    Special Requests NONE  Final   Culture   Final    NO GROWTH Performed at [facility], [address] [place], [place] [address]    Report Status 02/28/2018 FINAL  Final  Blood Culture ID Panel (Reflexed)     Status: Abnormal   Collection Time: 02/27/18 12:11 AM  Result Value Ref Range Status   Enterococcus species NOT DETECTED NOT DETECTED Final   Listeria monocytogenes NOT DETECTED NOT DETECTED Final   Staphylococcus species DETECTED (A) NOT DETECTED Final    Comment: Methicillin (oxacillin) resistant coagulase negative staphylococcus. Possible blood culture contaminant (unless isolated from more than one blood culture draw or clinical case suggests pathogenicity). No antibiotic treatment is indicated for blood  culture contaminants. CRITICAL RESULT CALLED TO, READ BACK BY AND VERIFIED WITH: H.BAIRD,PHARMD AT 2143 ON 02/27/18 BY G.MCADOO    Staphylococcus aureus (BCID) NOT DETECTED NOT DETECTED Final   Methicillin resistance DETECTED (A) NOT DETECTED Final    Comment: CRITICAL RESULT CALLED TO, READ BACK BY AND VERIFIED WITH: H.BAIRD,PHARMD AT 2143 ON 02/27/18 BY G.MCADOO    Streptococcus species NOT DETECTED NOT DETECTED Final   Streptococcus agalactiae NOT DETECTED NOT DETECTED Final   Streptococcus pneumoniae NOT DETECTED NOT DETECTED Final   Streptococcus pyogenes NOT DETECTED NOT DETECTED Final   Acinetobacter baumannii NOT DETECTED NOT DETECTED Final   Enterobacteriaceae species NOT DETECTED NOT DETECTED Final   Enterobacter cloacae complex NOT DETECTED NOT DETECTED Final   Escherichia coli NOT DETECTED NOT DETECTED Final   Klebsiella oxytoca NOT DETECTED NOT DETECTED Final   Klebsiella pneumoniae NOT DETECTED NOT DETECTED Final   Proteus species NOT DETECTED NOT DETECTED Final   Serratia marcescens NOT DETECTED NOT DETECTED Final   Haemophilus influenzae NOT DETECTED NOT DETECTED Final   Neisseria meningitidis NOT DETECTED NOT DETECTED Final   Pseudomonas  aeruginosa NOT DETECTED NOT DETECTED Final   Candida albicans NOT DETECTED NOT DETECTED Final   Candida glabrata NOT DETECTED NOT DETECTED Final   Candida krusei NOT DETECTED NOT DETECTED Final   Candida parapsilosis NOT DETECTED NOT DETECTED Final   Candida tropicalis NOT DETECTED NOT DETECTED Final    Comment: Performed at [facility], [address], [place], [place] [address]  MRSA PCR Screening     Status: None   Collection Time: 02/27/18 12:21 PM  Result Value Ref Range Status   MRSA by PCR  NEGATIVE NEGATIVE Final    Comment:        The GeneXpert MRSA Assay (FDA approved for NASAL specimens only), is one component of a comprehensive MRSA colonization surveillance program. It is not intended to diagnose MRSA infection nor to guide or monitor treatment for MRSA infections. Performed at [facility], [address], [place], [place] [address]   Blood Culture (routine x 2)     Status: None (Preliminary result)   Collection Time: 03/02/18  8:22 PM  Result Value Ref Range Status   Specimen Description BLOOD LEFT FOREARM  Final   Special Requests   Final    BOTTLES DRAWN AEROBIC AND ANAEROBIC Blood Culture results may not be optimal due to an inadequate volume of blood received in culture bottles   Culture   Final    NO GROWTH 2 DAYS Performed at [facility], [address], [place], [place] [address]    Report Status PENDING  Incomplete  Blood Culture (routine x 2)     Status: None (Preliminary result)   Collection Time: 03/02/18  8:22 PM  Result Value Ref Range Status   Specimen Description BLOOD LEFT HAND  Final   Special Requests   Final    BOTTLES DRAWN AEROBIC ONLY Blood Culture results may not be optimal due to an excessive volume of blood received in culture bottles   Culture   Final    NO GROWTH 2 DAYS Performed at [facility], [address], [place], [place] [address]    Report Status PENDING  Incomplete  MRSA PCR Screening     Status:  None   Collection Time: 03/03/18  8:36 AM  Result Value Ref Range Status   MRSA by PCR NEGATIVE NEGATIVE Final    Comment:        The GeneXpert MRSA Assay (FDA approved for NASAL specimens only), is one component of a comprehensive MRSA colonization surveillance program. It is not intended to diagnose MRSA infection nor to guide or monitor treatment for MRSA infections. Performed at [facility], [address], [place], [place] [address]          Radiology Studies: Dg Chest Port 1 View  Result Date: 03/02/2018 CLINICAL DATA:  Shortness of breath. EXAM: PORTABLE CHEST 1 VIEW COMPARISON:  02/27/2018 FINDINGS: Lungs are adequately inflated demonstrate persistent hazy airspace process over the right upper lobe with worsening patchy airspace process over the left mid to lower lung and right base. Possible small amount left pleural fluid. Cardiomediastinal silhouette and remainder of the exam is unchanged. IMPRESSION: Worsening bilateral multifocal airspace process likely pneumonia. Possible small amount left pleural fluid. Electronically Signed   By: Elberta Fortis M.D.   On: 03/02/2018 20:21   [place] Ekg Site Rite  Result Date: 03/04/2018 If Site Rite image not attached, placement could not be confirmed due to current cardiac rhythm.       Scheduled Meds: . chlorhexidine  15 mL Mouth Rinse BID  . heparin  5,000 Units Subcutaneous Q8H  . insulin aspart  0-5 Units Subcutaneous QHS  . insulin aspart  0-9 Units Subcutaneous TID WC  . mouth rinse  15 mL Mouth Rinse q12n4p  . mineral oil  1 enema Rectal Once   Continuous Infusions: . ceFEPime (MAXIPIME) IV    . dextrose 5 % and 0.9% NaCl 75 mL/hr at 03/04/18 0700     LOS: 2 days    Time spent: 35 mins.More than 50%  of that time was spent in counseling and/or coordination of care.      Burnadette Pop, MD [facility] Pager [phone]  If 7PM-7AM, please contact night-coverage [email or web address] Password  [email or web address] 03/04/2018, 1:13 PM

## 2018-03-04 NOTE — Significant Event (Signed)
I was notified that patient became dyspneic and desaturated.I have requested to put bipap back. Will obtain chest xray. Will insert foley catheter.Will hold fluids for now.

## 2018-03-04 NOTE — Progress Notes (Signed)
Pt was given a bath. Approximately 20 minutes later she appeared to be choking and struggling on her secretions.  Pt oxygen meter had a poor waveform, extremities were cold, pulses were thready.  Monitor read HR in 130-140's.    Oral pharangeal suctioning, [place] turned to 5L.  Respiratory and rapid response were called. Deep resp suctioning, with little return  Bipap was put on pt, repositioned higher in bed.  Lungs are rhonchorous bilaterally, extremities are cool.  Dr. Renford DillsAdhikari was notified of situation.  New orders on chart.    [name] was at bedside throughout event.

## 2018-03-04 NOTE — Clinical Social Work Note (Signed)
Pt well-known to the SE team of [facility]. As previously documented, pt was hospitalized last week, discharged and then returned the same day. Dtr Langley Gauss at bedside. She agrees that pt is DNR but wants to discuss issues on case by case basis. She continues to push food and fluids at home, though we have gently offered that pts often eat less and sleep more as they decline. Pt increasingly emotionally labile in recent months with crying-type facial expressions; dtr has stated she will sometimes awaken her at night with crying behaviors. 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 LCSW met with dtr, met with RN; speech therapist in to evaluate. [facility] will follow and assist as needed.

## 2018-03-04 NOTE — Progress Notes (Signed)
Peripherally Inserted Central Catheter/Midline Placement  The IV Nurse has discussed with the patient and/or persons authorized to consent for the patient, the purpose of this procedure and the potential benefits and risks involved with this procedure.  The benefits include less needle sticks, lab draws from the catheter, and the patient may be discharged home with the catheter. Risks include, but not limited to, infection, bleeding, blood clot (thrombus formation), and puncture of an artery; nerve damage and irregular heartbeat and possibility to perform a PICC exchange if needed/ordered by physician.  Alternatives to this procedure were also discussed.  Bard Power PICC patient education guide, fact sheet on infection prevention and patient information card has been provided to patient /or left at bedside.    PICC/Midline Placement Documentation  PICC Single Lumen 03/04/18 PICC Right Basilic 41 cm 0 cm (Active)  Indication for Insertion or Continuance of Line Poor Vasculature-patient has had multiple peripheral attempts or PIVs lasting less than 24 hours 03/04/2018  3:54 PM  Exposed Catheter (cm) 0 cm 03/04/2018  3:54 PM  Site Assessment Clean;Dry;Intact 03/04/2018  3:54 PM  Line Status Flushed;Blood return noted 03/04/2018  3:54 PM  Dressing Type Transparent 03/04/2018  3:54 PM  Dressing Status Clean;Dry;Intact;Antimicrobial disc in place 03/04/2018  3:54 PM  Dressing Intervention New dressing 03/04/2018  3:54 PM  Dressing Change Due 03/11/18 03/04/2018  3:54 PM    Daughter signe conssent at bedside  Maximino GreenlandLumban, Orton Capell Albarece 03/04/2018, 3:55 PM

## 2018-03-05 ENCOUNTER — Inpatient Hospital Stay (HOSPITAL_COMMUNITY)

## 2018-03-05 ENCOUNTER — Encounter (HOSPITAL_COMMUNITY): Payer: Self-pay

## 2018-03-05 DIAGNOSIS — R627 Adult failure to thrive: Secondary | ICD-10-CM

## 2018-03-05 DIAGNOSIS — Z515 Encounter for palliative care: Secondary | ICD-10-CM

## 2018-03-05 LAB — GLUCOSE, CAPILLARY
Glucose-Capillary: 139 mg/dL — ABNORMAL HIGH (ref 70–99)
Glucose-Capillary: 129 mg/dL — ABNORMAL HIGH (ref 70–99)
Glucose-Capillary: 132 mg/dL — ABNORMAL HIGH (ref 70–99)
Glucose-Capillary: 118 mg/dL — ABNORMAL HIGH (ref 70–99)
Glucose-Capillary: 136 mg/dL — ABNORMAL HIGH (ref 70–99)

## 2018-03-05 MED ORDER — FUROSEMIDE 10 MG/ML IJ SOLN
40.0000 mg | Freq: Once | INTRAMUSCULAR | Status: AC
  Administered 2018-03-05: 40 mg via INTRAVENOUS
  Filled 2018-03-05: qty 4

## 2018-03-05 NOTE — Progress Notes (Signed)
PICC line out, 2.5 cm, observed when dsg changed per IV consult. Site flushed without difficulty and with good blood return. Primary nurse assisted me with the dsg changed, she will call MD to get an order for X-ray to check placement. RR will update IV team via IV consult.  Hulda Marinonna Mackinzee Roszak RN IV VAST Team

## 2018-03-05 NOTE — Progress Notes (Signed)
[facility] GIP RN Visit@11 :00  This is a related and covered GIP admission of12/1with [facility] diagnosis ofAlzheimer's diseaseper Dr. Jamie BrookesMonguilod. Patient has OOF DNR in home.[facility], was at home for approximately one hour, daughter was bathing her and noticed that the pt was turning blue and unresponsive. EMS was activated, reported SPO2 in the 30s, bipap was initiated and pt was transported to ED.  Pt admitted with acute respiratory failure.  Day3of [facility] GIP.  Visited patient at bedside. No family or visitors present. Patient is on BiPAP Fi O2 50%.She did not tolerate wean off BiPAP. She does not respond to voice and appears to be sleeping. No apparent distress. She has a PICC line that was placed on 03/04/2018.     Medications: Maxipime 2g IVPB BID. No PRNs have been given.   Per MD notes: -Acute on chronic hypoxic/hypercarbic respiratory failure: Secondary to bilateral multifocal pneumonia..  To be put on BiPAP back yesterday night because of acute respiratory distress.  Our goal is to take off the BiPAP and put her on nasal cannula so that she can be started on feeding.  We will try that today.CXR last night showed possible pulmonary edema.  Given her dose of Lasix 40mg  this morning. -Recurrent aspiration pneumonia: Chest x-ray showed bilateral multifocal pneumonia.  Started on broad-spectrum antibiotics.  Currently on BiPAP.  Anticipating speech therapy help  for initiation of feeding.We will get a CT chest when her respiratory status is more stable and she is weaned off  BiPAP. -Advanced age/multiple comorbidities/poor prognosis: We have discussed extensively with the daughter at the bedside daily.Patient is DNR.  Daughter still anticipates full treatment and says her goal is to take her mother home. She had denied  residential hospice or comfort care on my previous conversation.  Palliative care is also  following.  Hopefully palliative care will help us this.  From my personal review, she is a candidate for residential hospice and comfort care.  Goals of care:Ongoing. Goals of care has been approached with Angelique Blonderenise (dtr and caregiver), she is not receptive to proceeding with comfort care, but refusing aggressive care. She wants to get her mother well enough to return home.  Even if her mother does not return to baseline, she wants to take her home.  She would not want residential hospice.  PMT is following as well and assisting with ongoing discussions and GOC.  Communication with [facility]: Left voicemail for daughter.   Communication with ZOX:[id number]:completed  Please use [facility] for ambulance transport at time of discharge.   Please call with any hospice related questions or concerns.   Thank you, Elsie SaasMary Anne Robertson, RN, [facility] Hospital Liaison (listed in [place]) [phone]

## 2018-03-05 NOTE — Progress Notes (Signed)
SLP Cancellation Note  Patient Details Name: Candice Woodward MRN: [id number] DOB: 05/28/1934   Cancelled treatment:  Pt remains on BIPAP and is not appropriate for swallow reassessment.  Daughter, [name], not present.  SLP will follow along to determine any real potential for PO, as well as provide ongoing education and support for [facility].          Blenda MountsCouture, Keeghan Mcintire Laurice 03/05/2018, 12:59 PM

## 2018-03-05 NOTE — Progress Notes (Signed)
  Speech Language Pathology Treatment: Dysphagia  Patient Details Name: Candice Woodward MRN: [id number] DOB: 01/31/1935 Today's Date: 03/05/2018 Time: [id number] SLP Time Calculation (min) (ACUTE ONLY): 59 min  Assessment / Plan / Recommendation Clinical Impression  SLP was paged to return as pt had been taken off BiPAP and family was now present. Pt is more alert and accepting of POs, although she still needs significant cueing for acceptance - fluctuating between Mod and Max cues throughout session. POs were offered by SLP and by daughter, using all swallowing and positional strategies that daughter requested. Pt would occasionally fall asleep in the middle of attempting boluses. Pt has cognitively-based oral deficits but she also has wheezing and occasional, delayed coughing that is concerning for overall respiratory status, secretion management, and/or airway protection. Extensive education was provided: although she has a chronic risk for aspiration given her advanced dementia, she also has an elevated risk at the moment given mentation and respiratory status that are both altered from baseline. Discussed a variety of options, including but not limited to starting her baseline diet as comfort feeds, starting the water protocol (also accepting the risk of aspiration, but trying to reduce the risk of infection), or maintaining NPO status to allow for additional time to see how pt may or may not progress cognitively. At this time, her daughter wants to be more conservative with her approach and maintain NPO status with further SLP f/u. Will continue efforts for differential diagnosis and continued education.   HPI HPI: 83 y.o. female with PMHx of severe Alzheimer's dementia, cared for at home by her daughter, admitted after witnessed aspiration event.  Pt put on NRB, abx, and per chart, is approaching baseline today.  Pt is being followed by home hospice.  She has a hx of chronic dysphagia.  She was last  seen by our SLP service 08/08/16 - at that time, pt's daughter was managing strategies for swallowing/feeding quite well, and had good understanding of the impact of dementia on swallowing.       SLP Plan  Continue with current plan of care       Recommendations  Diet recommendations: NPO Medication Administration: Via alternative means                Oral Care Recommendations: Oral care QID Follow up Recommendations: 24 hour supervision/assistance SLP Visit Diagnosis: Dysphagia, unspecified (R13.10) Plan: Continue with current plan of care       GO                Candice Woodward, Candice Woodward 03/05/2018, 5:17 PM  Candice HamLaura Woodward, M.A. CCC-SLP Acute Herbalist[facility]abilitation Services Pager [phone] Office [phone]

## 2018-03-05 NOTE — Progress Notes (Signed)
PROGRESS NOTE    Candice Woodward  ZOX:[id number] DOB: [birth year] DOA: 03/02/2018 PCP: Jiles Garter, FNP   Brief Narrative: Patient is 83 year old with past medical history of recurrent aspiration pneumonia, chronic respiratory failure, dementia on hospice, diabetes, was discharged from here 2 days ago and was brought back because of worsening dyspnea.  Patient was managed here for aspiration pneumonia and was discharged on comfort feeding followed by hospice.  On arrival, she was found to have severe lactic acidosis, acute respiratory failure and chest x-ray showed worsening bilateral multifocal pneumonia.  Currently she is being managed for acute respiratory failure secondary to multifocal pneumonia.  Palliative care has been consulted.  Assessment & Plan:   Principal Problem:   Sepsis, Gram negative (HCC) Active Problems:   Alzheimer disease (HCC)   Hypothyroidism   Essential hypertension   Acute hypoxemic respiratory failure (HCC)   AKI (acute kidney injury) (HCC)   Hyponatremia   Acute metabolic encephalopathy   Hyperkalemia  Severe sepsis:Presented with severe lactic acidosis.  Sepsis secondary to aspiration pneumonia. .  Blood cultures have been sent.So far negative.  Acute on chronic hypoxic/hypercarbic respiratory failure: Secondary to bilateral multifocal pneumonia..  To be put on BiPAP back yesterday night because of acute respiratory distress.  Our goal is to take off the BiPAP and put her on nasal cannula so that she can be started on feeding.  We will try that today.CXR last night showed possible pulmonary edema.  Given her dose of Lasix 40mg  this morning.  Recurrent aspiration pneumonia: Chest x-ray showed bilateral multifocal pneumonia.  Started on broad-spectrum antibiotics.  Currently on BiPAP.  Anticipating speech therapy help  for initiation of feeding.We will get a CT chest when her respiratory status is more stable and she is weaned off  BiPAP.  Hyperkalemia:  Resolved  Acute kidney injury:Resolved  Hyponatremia:Mild.IV was on hold because of CxR showed   pulmonary edema  Metabolic encephalopathy: Associated with pneumonia, metabolic encephalopathy.  Patient is nonverbal on baseline.  Dementia:Nonverbal and confused at baseline.  Being followed with hospice at home.  Diabetes: Continue sliding-scale insulin for now.  Constipation: Had a bowel movement yesterday   Advanced age/multiple comorbidities/poor prognosis: We have discussed extensively with the daughter at the bedside daily.Patient is DNR.  Daughter still anticipates full treatment and says her goal is to take her mother home. She had denied  residential hospice or comfort care on my previous conversation.  Palliative care is also following.  Hopefully palliative care will help [place] this.  From my personal review, she is a candidate for residential hospice and comfort care.      DVT prophylaxis: SCD Code Status: DNR Family Communication: daughter Disposition Plan: Undetermined at this point.   Consultants: None  Procedures:None  Antimicrobials: Cefepime day 3  Subjective: Patient seen and examined at bedside this morning.  Remains nonverbal.  On BiPAP. Daughter was present at the bedside.  Objective: Vitals:   03/05/18 0000 03/05/18 0047 03/05/18 0338 03/05/18 0455  BP:  112/75 109/67 102/61  Pulse: 81 71 79 74  Resp: (!) 21 18 19 16   Temp:  98.1 F (36.7 C)  98.4 F (36.9 C)  TempSrc:  Oral  Axillary  SpO2: 96% 100% 100% 100%  Weight:      Height:        Intake/Output Summary (Last 24 hours) at 03/05/2018 0901 Last data filed at 03/05/2018 0600 Gross per 24 hour  Intake 0 ml  Output 1100 ml  Net -1100 ml  Filed Weights   03/02/18 2045 03/03/18 0828  Weight: 95.7 kg 100.4 kg    Examination:  General exam: Chronically ill looking, obese HEENT:PERRL,Oral mucosa moist, Ear/Nose normal on gross exam Respiratory system: Bilateral decreased air entry,  bilateral coarse breathing sounds,crackles Cardiovascular system: S1 & S2 heard, RRR. No JVD, murmurs, rubs, gallops or clicks. Pedal edema. Gastrointestinal system: Abdomen is distended, soft and nontender. No organomegaly or masses felt. Normal bowel sounds heard. Central nervous system: Not Alert and oriented.  Extremities: Pedal edema, no clubbing ,no cyanosis, distal peripheral pulses palpable.   Data Reviewed: I have personally reviewed following labs and imaging studies  CBC: Recent Labs  Lab 02/27/18 0011 03/02/18 1949 03/02/18 2003 03/03/18 1002 03/04/18 0640  WBC 14.0* 15.2*  --  11.6* 7.0  NEUTROABS 10.2* 9.8*  --  9.4* 5.0  HGB 13.6 12.7 13.9 10.7* 10.6*  HCT 41.9 41.8 41.0 34.1* 33.7*  MCV 86.4 89.5  --  86.8 87.5  PLT 284 330  --  203 180   Basic Metabolic Panel: Recent Labs  Lab 03/02/18 0233 03/02/18 1949 03/02/18 2003 03/02/18 2204 03/03/18 1002 03/04/18 0640 03/04/18 2227  NA 128* 126* 123*  --  130* 132* 133*  K 4.0 6.2* 6.2* 4.5 4.2 3.8 3.8  CL 90* 90* 93*  --  92* 95* 96*  CO2 26 15*  --   --  30 28 28   GLUCOSE 211* 288* 296*  --  174* 197* 163*  BUN 10 12 16   --  9 6* <5*  CREATININE 0.95 1.19* 1.00  --  1.02* 0.91 0.91  CALCIUM 8.8* 8.6*  --   --  8.5* 8.3* 8.1*   GFR: Estimated Creatinine Clearance: 57.9 mL/min (by C-G formula based on SCr of 0.91 mg/dL). Liver Function Tests: Recent Labs  Lab 02/27/18 0011 03/02/18 1949 03/03/18 1002  AST 34 50* 18  ALT 30 19 14   ALKPHOS 76 56 42  BILITOT 0.5 1.7* 0.6  PROT 7.4 6.6 6.0*  ALBUMIN 3.2* 2.8* 2.6*   No results for input(s): LIPASE, AMYLASE in the last 168 hours. No results for input(s): AMMONIA in the last 168 hours. Coagulation Profile: Recent Labs  Lab 02/27/18 0011 03/03/18 1002  INR 1.05 1.10   Cardiac Enzymes: Recent Labs  Lab 02/27/18 0011 03/02/18 1949  TROPONINI 0.03* 0.04*   BNP (last 3 results) No results for input(s): PROBNP in the last 8760 hours. HbA1C: No  results for input(s): HGBA1C in the last 72 hours. CBG: Recent Labs  Lab 03/04/18 1136 03/04/18 2114 03/04/18 2349 03/05/18 0417 03/05/18 0758  GLUCAP 153* 170* 156* 139* 129*   Lipid Profile: No results for input(s): CHOL, HDL, LDLCALC, TRIG, CHOLHDL, LDLDIRECT in the last 72 hours. Thyroid Function Tests: No results for input(s): TSH, T4TOTAL, FREET4, T3FREE, THYROIDAB in the last 72 hours. Anemia Panel: No results for input(s): VITAMINB12, FOLATE, FERRITIN, TIBC, IRON, RETICCTPCT in the last 72 hours. Sepsis Labs: Recent Labs  Lab 03/02/18 2004 03/02/18 2217 03/03/18 1002 03/03/18 1908 03/03/18 2146  PROCALCITON  --   --  1.87  --   --   LATICACIDVEN 5.15* 3.37*  --  1.2 0.9    Recent Results (from the past 240 hour(s))  Blood Culture (routine x 2)     Status: None   Collection Time: 02/27/18 12:11 AM  Result Value Ref Range Status   Specimen Description BLOOD LEFT HAND  Final   Special Requests   Final    BOTTLES DRAWN  AEROBIC AND ANAEROBIC Blood Culture adequate volume   Culture   Final    NO GROWTH 5 DAYS Performed at [facility], [address], [place], [place] [address]    Report Status 03/04/2018 FINAL  Final  Blood Culture (routine x 2)     Status: Abnormal   Collection Time: 02/27/18 12:11 AM  Result Value Ref Range Status   Specimen Description BLOOD RIGHT WRIST  Final   Special Requests   Final    BOTTLES DRAWN AEROBIC ONLY Blood Culture adequate volume   Culture  Setup Time   Final    GRAM POSITIVE COCCI IN CLUSTERS AEROBIC BOTTLE ONLY Organism ID to follow CRITICAL RESULT CALLED TO, READ BACK BY AND VERIFIED WITH: H.BAIRD,PHARMD AT 2143 ON 02/27/18 BY G.MCADOO    Culture (A)  Final    STAPHYLOCOCCUS SPECIES (COAGULASE NEGATIVE) THE SIGNIFICANCE OF ISOLATING THIS ORGANISM FROM A SINGLE SET OF BLOOD CULTURES WHEN MULTIPLE SETS ARE DRAWN IS UNCERTAIN. PLEASE NOTIFY THE MICROBIOLOGY DEPARTMENT WITHIN ONE WEEK IF SPECIATION AND SENSITIVITIES  ARE REQUIRED. Performed at [facility], [address], [place], [place] [address]    Report Status 03/01/2018 FINAL  Final  Urine culture     Status: None   Collection Time: 02/27/18 12:11 AM  Result Value Ref Range Status   Specimen Description URINE, RANDOM  Final   Special Requests NONE  Final   Culture   Final    NO GROWTH Performed at [facility], [address] [place], [place] [address]    Report Status 02/28/2018 FINAL  Final  Blood Culture ID Panel (Reflexed)     Status: Abnormal   Collection Time: 02/27/18 12:11 AM  Result Value Ref Range Status   Enterococcus species NOT DETECTED NOT DETECTED Final   Listeria monocytogenes NOT DETECTED NOT DETECTED Final   Staphylococcus species DETECTED (A) NOT DETECTED Final    Comment: Methicillin (oxacillin) resistant coagulase negative staphylococcus. Possible blood culture contaminant (unless isolated from more than one blood culture draw or clinical case suggests pathogenicity). No antibiotic treatment is indicated for blood  culture contaminants. CRITICAL RESULT CALLED TO, READ BACK BY AND VERIFIED WITH: H.BAIRD,PHARMD AT 2143 ON 02/27/18 BY G.MCADOO    Staphylococcus aureus (BCID) NOT DETECTED NOT DETECTED Final   Methicillin resistance DETECTED (A) NOT DETECTED Final    Comment: CRITICAL RESULT CALLED TO, READ BACK BY AND VERIFIED WITH: H.BAIRD,PHARMD AT 2143 ON 02/27/18 BY G.MCADOO    Streptococcus species NOT DETECTED NOT DETECTED Final   Streptococcus agalactiae NOT DETECTED NOT DETECTED Final   Streptococcus pneumoniae NOT DETECTED NOT DETECTED Final   Streptococcus pyogenes NOT DETECTED NOT DETECTED Final   Acinetobacter baumannii NOT DETECTED NOT DETECTED Final   Enterobacteriaceae species NOT DETECTED NOT DETECTED Final   Enterobacter cloacae complex NOT DETECTED NOT DETECTED Final   Escherichia coli NOT DETECTED NOT DETECTED Final   Klebsiella oxytoca NOT DETECTED NOT DETECTED Final   Klebsiella  pneumoniae NOT DETECTED NOT DETECTED Final   Proteus species NOT DETECTED NOT DETECTED Final   Serratia marcescens NOT DETECTED NOT DETECTED Final   Haemophilus influenzae NOT DETECTED NOT DETECTED Final   Neisseria meningitidis NOT DETECTED NOT DETECTED Final   Pseudomonas aeruginosa NOT DETECTED NOT DETECTED Final   Candida albicans NOT DETECTED NOT DETECTED Final   Candida glabrata NOT DETECTED NOT DETECTED Final   Candida krusei NOT DETECTED NOT DETECTED Final   Candida parapsilosis NOT DETECTED NOT DETECTED Final  Candida tropicalis NOT DETECTED NOT DETECTED Final    Comment: Performed at [facility], [address] [place], [place] [address]  MRSA PCR Screening     Status: None   Collection Time: 02/27/18 12:21 PM  Result Value Ref Range Status   MRSA by PCR NEGATIVE NEGATIVE Final    Comment:        The GeneXpert MRSA Assay (FDA approved for NASAL specimens only), is one component of a comprehensive MRSA colonization surveillance program. It is not intended to diagnose MRSA infection nor to guide or monitor treatment for MRSA infections. Performed at [facility], [address], [place], [place] [address]   Blood Culture (routine x 2)     Status: None (Preliminary result)   Collection Time: 03/02/18  8:22 PM  Result Value Ref Range Status   Specimen Description BLOOD LEFT FOREARM  Final   Special Requests   Final    BOTTLES DRAWN AEROBIC AND ANAEROBIC Blood Culture results may not be optimal due to an inadequate volume of blood received in culture bottles   Culture   Final    NO GROWTH 2 DAYS Performed at [facility], [address], [place], [place] [address]    Report Status PENDING  Incomplete  Blood Culture (routine x 2)     Status: None (Preliminary result)   Collection Time: 03/02/18  8:22 PM  Result Value Ref Range Status   Specimen Description BLOOD LEFT HAND  Final   Special Requests   Final    BOTTLES DRAWN AEROBIC ONLY Blood Culture  results may not be optimal due to an excessive volume of blood received in culture bottles   Culture   Final    NO GROWTH 2 DAYS Performed at [facility], [address] [place], [place] [address]    Report Status PENDING  Incomplete  MRSA PCR Screening     Status: None   Collection Time: 03/03/18  8:36 AM  Result Value Ref Range Status   MRSA by PCR NEGATIVE NEGATIVE Final    Comment:        The GeneXpert MRSA Assay (FDA approved for NASAL specimens only), is one component of a comprehensive MRSA colonization surveillance program. It is not intended to diagnose MRSA infection nor to guide or monitor treatment for MRSA infections. Performed at [facility] Lab, [address], [place], [place] [address]          Radiology Studies: Dg Chest 1 View  Result Date: 03/04/2018 CLINICAL DATA:  Dyspnea EXAM: CHEST  1 VIEW COMPARISON:  03/02/2018 FINDINGS: Stable cardiomegaly. Nonaneurysmal thoracic aorta. Diffuse bilateral airspace opacities at each lung base and left mid to upper lung may reflect stigmata of prior pneumonia or CHF depending on clinical presentation. PICC line catheter tip terminates at the cavoatrial junction. IMPRESSION: Patchy airspace opacities with cardiomegaly query CHF or multifocal pneumonia. Electronically Signed   By: Tollie Eth M.D.   On: 03/04/2018 23:54   [place] Ekg Site Rite  Result Date: 03/04/2018 If Site Rite image not attached, placement could not be confirmed due to current cardiac rhythm.       Scheduled Meds: . chlorhexidine  15 mL Mouth Rinse BID  . heparin  5,000 Units Subcutaneous Q8H  . insulin aspart  0-5 Units Subcutaneous QHS  . insulin aspart  0-9 Units Subcutaneous TID WC  . mouth rinse  15 mL Mouth Rinse q12n4p  . scopolamine  1 patch  Transdermal Q72H   Continuous Infusions: . ceFEPime (MAXIPIME) IV 2 g (03/05/18 0821)     LOS: 3 days    Time spent: 35 mins.More than 50% of that time was spent in counseling and/or  coordination of care.      Burnadette Pop, MD [facility] Pager [phone]  If 7PM-7AM, please contact night-coverage [email or web address] Password [email or web address] 03/05/2018, 9:01 AM

## 2018-03-06 ENCOUNTER — Inpatient Hospital Stay (HOSPITAL_COMMUNITY)

## 2018-03-06 DIAGNOSIS — R1310 Dysphagia, unspecified: Secondary | ICD-10-CM

## 2018-03-06 LAB — CBC WITH DIFFERENTIAL/PLATELET
Abs Immature Granulocytes: 0.04 10*3/uL (ref 0.00–0.07)
Basophils Absolute: 0.1 10*3/uL (ref 0.0–0.1)
Basophils Relative: 1 %
Eosinophils Absolute: 0.2 10*3/uL (ref 0.0–0.5)
Eosinophils Relative: 2 %
HCT: 32.8 % — ABNORMAL LOW (ref 36.0–46.0)
Hemoglobin: 10.1 g/dL — ABNORMAL LOW (ref 12.0–15.0)
Immature Granulocytes: 1 %
Lymphocytes Relative: 18 %
Lymphs Abs: 1.5 10*3/uL (ref 0.7–4.0)
MCH: 27.2 pg (ref 26.0–34.0)
MCHC: 30.8 g/dL (ref 30.0–36.0)
MCV: 88.2 fL (ref 80.0–100.0)
Monocytes Absolute: 0.6 10*3/uL (ref 0.1–1.0)
Monocytes Relative: 7 %
Neutro Abs: 5.9 10*3/uL (ref 1.7–7.7)
Neutrophils Relative %: 71 %
Platelets: 208 10*3/uL (ref 150–400)
RBC: 3.72 MIL/uL — ABNORMAL LOW (ref 3.87–5.11)
RDW: 14.8 % (ref 11.5–15.5)
WBC: 8.2 10*3/uL (ref 4.0–10.5)
nRBC: 0 % (ref 0.0–0.2)

## 2018-03-06 LAB — GLUCOSE, CAPILLARY
Glucose-Capillary: 125 mg/dL — ABNORMAL HIGH (ref 70–99)
Glucose-Capillary: 148 mg/dL — ABNORMAL HIGH (ref 70–99)
Glucose-Capillary: 122 mg/dL — ABNORMAL HIGH (ref 70–99)
Glucose-Capillary: 120 mg/dL — ABNORMAL HIGH (ref 70–99)

## 2018-03-06 LAB — BASIC METABOLIC PANEL
Anion gap: 12 (ref 5–15)
BUN: 7 mg/dL — ABNORMAL LOW (ref 8–23)
CO2: 29 mmol/L (ref 22–32)
Calcium: 8.5 mg/dL — ABNORMAL LOW (ref 8.9–10.3)
Chloride: 98 mmol/L (ref 98–111)
Creatinine, Ser: 1.08 mg/dL — ABNORMAL HIGH (ref 0.44–1.00)
GFR calc Af Amer: 56 mL/min — ABNORMAL LOW (ref >60)
GFR calc non Af Amer: 48 mL/min — ABNORMAL LOW (ref >60)
Glucose, Bld: 139 mg/dL — ABNORMAL HIGH (ref 70–99)
Potassium: 3.8 mmol/L (ref 3.5–5.1)
Sodium: 139 mmol/L (ref 135–145)

## 2018-03-06 MED ORDER — FUROSEMIDE 10 MG/ML IJ SOLN
40.0000 mg | Freq: Two times a day (BID) | INTRAMUSCULAR | Status: DC
  Administered 2018-03-07 (×2): 40 mg via INTRAVENOUS
  Filled 2018-03-06 (×2): qty 4

## 2018-03-06 MED ORDER — SODIUM CHLORIDE 0.9 % IV SOLN
500.0000 mg | INTRAVENOUS | Status: DC
  Administered 2018-03-07 (×2): 500 mg via INTRAVENOUS
  Filled 2018-03-06 (×2): qty 500

## 2018-03-06 MED ORDER — SODIUM CHLORIDE 0.9 % IV SOLN
2.0000 g | INTRAVENOUS | Status: DC
  Administered 2018-03-07 – 2018-03-08 (×2): 2 g via INTRAVENOUS
  Filled 2018-03-06 (×2): qty 2

## 2018-03-06 NOTE — Progress Notes (Signed)
Patient ID: Candice Woodward, female   DOB: 05/04/1934, 83 y.o.   MRN: [id number]  This NP visited patient at the bedside as a follow up for palliative medicine needs and emotional support.  [name] at bedside.  Continued conversation regarding current medical situation and the natural trajectory of dementia.  Discussed in detail the concept of failure to thrive as it relates to dysphasia and inability to support oneself both from a nutrition and hydration standpoint.    We discussed the pros and cons of artificial feeding and hydration specifically to a PEG tube. Educated family that there is no research to support that a feeding tube in an end-stage dementia patient prolongs life. PEG is not recommended  She will continue to be at high risk for aspiration.  Discussed with daughter the importance of continued conversation with the  medical providers regarding overall plan of care and treatment options,  ensuring decisions are within the context of the patient's best interest  And values and GOCs.  Questions and concerns addressed.    I informed Candice Woodward and [facility]  liaison  that I would not be back in the hospital until Monday but that she could call the team phone with questions or concerns.  Total time spent on the unit was 35 minutes     Greater than 50% of the time was spent in counseling and coordination of care  Lorinda CreedMary Vannary Greening NP  Palliative Medicine Team Team Phone # [phone]- [phone] Pager [phone]

## 2018-03-06 NOTE — Progress Notes (Signed)
[facility] 5W29[facility] GIP RN Visit@1200   This is a related and covered GIP admission of12/1with [facility] diagnosis ofAlzheimer's diseaseper Dr. Jamie BrookesMonguilod. Patient has OOF DNR in home.[facility], was at home for approximately one hour, daughter was bathing her and noticed that the pt was turning blue and unresponsive. EMS was activated, reported SPO2 in the 30s, bipap was initiated and pt was transported to ED.  Pt admitted with acute respiratory failure.  Day4of [facility] GIP.  Visited patient at bedside. [name] present.  Pt appears to be at baseline, nonverbal, eyes shut, appears comfortable.  Pt has not been on bipap since yesterday. On O2 @ 3lpm, VS: 152/76, 85, 26, SPO2 90%. [name] is eager to take her mom home.  She is not open to discussing any direction other than actively treating her current PNA.  She is adamant on her regaining mentation some so she can eat and drink as before, in spite of known aspiration.    Medications: Maxipime 2g IVPB BID. Additional dose of lasix, as yesterdays chest xray indicated pulmonary edema.  No PRNs have been given.    Goals of care:Ongoing. Goals of care has been approached with Angelique Blonderenise (dtr and caregiver), she is not receptive to proceeding with comfort care, but refusing aggressive care. She wants to get her mother well enough to return home.Even if her mother does not return to baseline, she wants to take her home. She would not want residential hospice.  Discussion with RNCM about MOST form, and having PMT re-discuss this with her during this admission.  PMT is following as well and assisting with ongoing discussions and GOC.  Communication with [facility]: Discussion at bedside  Communication with VQQ:[id number]:completed  [facility] at time of discharge.   Please call with any hospice related questions or concerns.  Wallis BambergJennifer Woody BSN, RN [facility]  Liaison (listed in [place]) [phone]

## 2018-03-06 NOTE — Progress Notes (Signed)
PROGRESS NOTE    Candice RUPERT  Woodward:[id number] DOB: 1935-01-21 DOA: 03/02/2018 PCP: Jiles Garter, FNP   Brief Narrative: Patient is 83 year old with past medical history of recurrent aspiration pneumonia, chronic respiratory failure, dementia on hospice, diabetes, was discharged from here 2 days ago and was brought back because of worsening dyspnea.  Patient was managed here for aspiration pneumonia and was discharged on comfort feeding followed by hospice.  On arrival, she was found to have severe lactic acidosis, acute respiratory failure and chest x-ray showed worsening bilateral multifocal pneumonia.  Currently she is being managed for acute respiratory failure secondary to multifocal pneumonia.  Palliative care has been consulted.  Assessment & Plan:   Principal Problem:   Sepsis, Gram negative (HCC) Active Problems:   Alzheimer disease (HCC)   Hypothyroidism   Essential hypertension   Acute respiratory failure with hypoxia (HCC)   AKI (acute kidney injury) (HCC)   Hyponatremia   Acute metabolic encephalopathy   Hyperkalemia   Adult failure to thrive   Palliative care by specialist  Severe sepsis:Presented with severe lactic acidosis.  Sepsis secondary to aspiration pneumonia. .  Blood cultures have been sent.So far negative.  Acute on chronic hypoxic/hypercarbic respiratory failure: Secondary to bilateral multifocal pneumonia..  Had to be put on  BiPAP because of acute respiratory distress.  Our goal is to take off the BiPAP and put her on nasal cannula so that she can be started on feeding.She is off bipap now.  Last CXRshowed possible pulmonary edema.  Given Lasix 40mg  on 03/05/18.Will check CT chest today.  Recurrent aspiration pneumonia/Dysphagia: Chest x-ray showed bilateral multifocal pneumonia.  Started on broad-spectrum antibiotics.  Currently off  BiPAP.  Anticipating speech therapy help  for initiation of feeding.We will get a CT chest. Speech therapy closely  following.  She ate some bits today.  Hyperkalemia: Resolved  Acute kidney injury:Resolved  Metabolic encephalopathy: Associated with pneumonia, metabolic encephalopathy.  Patient is nonverbal on baseline.  Dementia:Nonverbal and confused at baseline.  Being followed with hospice at home.  Diabetes: Continue sliding-scale insulin for now.  Constipation: Had a bowel movement on 03/04/18.   Advanced age/multiple comorbidities/poor prognosis: We have discussed extensively with the daughter at the bedside daily.Patient is DNR.  Daughter wants ongoing  Treatment for pneumonia and says her goal is to take her mother home. She had denied  residential hospice or comfort care on my previous conversation.  Palliative care is also following.      DVT prophylaxis: SCD Code Status: DNR Family Communication: daughter Disposition Plan: Undetermined at this point.   Consultants: None  Procedures:None  Antimicrobials: Cefepime day 34 Subjective: Patient seen and examined at bedside this morning.  Looks more comfortable today.  Not in respiratory distress.  Off BiPAP.  Objective: Vitals:   03/06/18 0447 03/06/18 0457 03/06/18 0820 03/06/18 1201  BP:  (!) 152/76    Pulse:  85    Resp:  (!) 26    Temp: 98.8 F (37.1 C)  98.4 F (36.9 C) 98.6 F (37 C)  TempSrc: Axillary  Axillary Axillary  SpO2:  90%    Weight:      Height:        Intake/Output Summary (Last 24 hours) at 03/06/2018 1314 Last data filed at 03/06/2018 0447 Gross per 24 hour  Intake 200 ml  Output 2300 ml  Net -2100 ml   Filed Weights   03/02/18 2045 03/03/18 0828  Weight: 95.7 kg 100.4 kg    Examination:  General exam: Chronically ill looking, obese HEENT:PERRL,Oral mucosa moist, Ear/Nose normal on gross exam Respiratory system: Bilateral decreased air entry, bilateral coarse breathing sounds,crackles,wheezes Cardiovascular system: S1 & S2 heard, RRR. No JVD, murmurs, rubs, gallops or clicks. Pedal  edema. Gastrointestinal system: Abdomen is distended, soft and nontender. No organomegaly or masses felt. Normal bowel sounds heard. Central nervous system: Not Alert and oriented.  Extremities: Pedal edema, no clubbing ,no cyanosis, distal peripheral pulses palpable.   Data Reviewed: I have personally reviewed following labs and imaging studies  CBC: Recent Labs  Lab 03/02/18 1949 03/02/18 2003 03/03/18 1002 03/04/18 0640 03/06/18 0400  WBC 15.2*  --  11.6* 7.0 8.2  NEUTROABS 9.8*  --  9.4* 5.0 5.9  HGB 12.7 13.9 10.7* 10.6* 10.1*  HCT 41.8 41.0 34.1* 33.7* 32.8*  MCV 89.5  --  86.8 87.5 88.2  PLT 330  --  203 180 208   Basic Metabolic Panel: Recent Labs  Lab 03/02/18 1949 03/02/18 2003 03/02/18 2204 03/03/18 1002 03/04/18 0640 03/04/18 2227 03/06/18 0400  NA 126* 123*  --  130* 132* 133* 139  K 6.2* 6.2* 4.5 4.2 3.8 3.8 3.8  CL 90* 93*  --  92* 95* 96* 98  CO2 15*  --   --  30 28 28 29   GLUCOSE 288* 296*  --  174* 197* 163* 139*  BUN 12 16  --  9 6* <5* 7*  CREATININE 1.19* 1.00  --  1.02* 0.91 0.91 1.08*  CALCIUM 8.6*  --   --  8.5* 8.3* 8.1* 8.5*   GFR: Estimated Creatinine Clearance: 48.8 mL/min (A) (by C-G formula based on SCr of 1.08 mg/dL (H)). Liver Function Tests: Recent Labs  Lab 03/02/18 1949 03/03/18 1002  AST 50* 18  ALT 19 14  ALKPHOS 56 42  BILITOT 1.7* 0.6  PROT 6.6 6.0*  ALBUMIN 2.8* 2.6*   No results for input(s): LIPASE, AMYLASE in the last 168 hours. No results for input(s): AMMONIA in the last 168 hours. Coagulation Profile: Recent Labs  Lab 03/03/18 1002  INR 1.10   Cardiac Enzymes: Recent Labs  Lab 03/02/18 1949  TROPONINI 0.04*   BNP (last 3 results) No results for input(s): PROBNP in the last 8760 hours. HbA1C: No results for input(s): HGBA1C in the last 72 hours. CBG: Recent Labs  Lab 03/05/18 1209 03/05/18 1648 03/05/18 2119 03/06/18 0749 03/06/18 1139  GLUCAP 132* 118* 136* 125* 148*   Lipid Profile: No  results for input(s): CHOL, HDL, LDLCALC, TRIG, CHOLHDL, LDLDIRECT in the last 72 hours. Thyroid Function Tests: No results for input(s): TSH, T4TOTAL, FREET4, T3FREE, THYROIDAB in the last 72 hours. Anemia Panel: No results for input(s): VITAMINB12, FOLATE, FERRITIN, TIBC, IRON, RETICCTPCT in the last 72 hours. Sepsis Labs: Recent Labs  Lab 03/02/18 2004 03/02/18 2217 03/03/18 1002 03/03/18 1908 03/03/18 2146  PROCALCITON  --   --  1.87  --   --   LATICACIDVEN 5.15* 3.37*  --  1.2 0.9    Recent Results (from the past 240 hour(s))  Blood Culture (routine x 2)     Status: None   Collection Time: 02/27/18 12:11 AM  Result Value Ref Range Status   Specimen Description BLOOD LEFT HAND  Final   Special Requests   Final    BOTTLES DRAWN AEROBIC AND ANAEROBIC Blood Culture adequate volume   Culture   Final    NO GROWTH 5 DAYS Performed at [facility], [address] [place],  [place] [address]    Report Status 03/04/2018 FINAL  Final  Blood Culture (routine x 2)     Status: Abnormal   Collection Time: 02/27/18 12:11 AM  Result Value Ref Range Status   Specimen Description BLOOD RIGHT WRIST  Final   Special Requests   Final    BOTTLES DRAWN AEROBIC ONLY Blood Culture adequate volume   Culture  Setup Time   Final    GRAM POSITIVE COCCI IN CLUSTERS AEROBIC BOTTLE ONLY Organism ID to follow CRITICAL RESULT CALLED TO, READ BACK BY AND VERIFIED WITH: H.BAIRD,PHARMD AT 2143 ON 02/27/18 BY G.MCADOO    Culture (A)  Final    STAPHYLOCOCCUS SPECIES (COAGULASE NEGATIVE) THE SIGNIFICANCE OF ISOLATING THIS ORGANISM FROM A SINGLE SET OF BLOOD CULTURES WHEN MULTIPLE SETS ARE DRAWN IS UNCERTAIN. PLEASE NOTIFY THE MICROBIOLOGY DEPARTMENT WITHIN ONE WEEK IF SPECIATION AND SENSITIVITIES ARE REQUIRED. Performed at [facility], [address] [place], [place] [address]    Report Status 03/01/2018 FINAL  Final  Urine culture     Status: None   Collection Time: 02/27/18 12:11 AM   Result Value Ref Range Status   Specimen Description URINE, RANDOM  Final   Special Requests NONE  Final   Culture   Final    NO GROWTH Performed at [facility], [address], [place], [place] [address]    Report Status 02/28/2018 FINAL  Final  Blood Culture ID Panel (Reflexed)     Status: Abnormal   Collection Time: 02/27/18 12:11 AM  Result Value Ref Range Status   Enterococcus species NOT DETECTED NOT DETECTED Final   Listeria monocytogenes NOT DETECTED NOT DETECTED Final   Staphylococcus species DETECTED (A) NOT DETECTED Final    Comment: Methicillin (oxacillin) resistant coagulase negative staphylococcus. Possible blood culture contaminant (unless isolated from more than one blood culture draw or clinical case suggests pathogenicity). No antibiotic treatment is indicated for blood  culture contaminants. CRITICAL RESULT CALLED TO, READ BACK BY AND VERIFIED WITH: H.BAIRD,PHARMD AT 2143 ON 02/27/18 BY G.MCADOO    Staphylococcus aureus (BCID) NOT DETECTED NOT DETECTED Final   Methicillin resistance DETECTED (A) NOT DETECTED Final    Comment: CRITICAL RESULT CALLED TO, READ BACK BY AND VERIFIED WITH: H.BAIRD,PHARMD AT 2143 ON 02/27/18 BY G.MCADOO    Streptococcus species NOT DETECTED NOT DETECTED Final   Streptococcus agalactiae NOT DETECTED NOT DETECTED Final   Streptococcus pneumoniae NOT DETECTED NOT DETECTED Final   Streptococcus pyogenes NOT DETECTED NOT DETECTED Final   Acinetobacter baumannii NOT DETECTED NOT DETECTED Final   Enterobacteriaceae species NOT DETECTED NOT DETECTED Final   Enterobacter cloacae complex NOT DETECTED NOT DETECTED Final   Escherichia coli NOT DETECTED NOT DETECTED Final   Klebsiella oxytoca NOT DETECTED NOT DETECTED Final   Klebsiella pneumoniae NOT DETECTED NOT DETECTED Final   Proteus species NOT DETECTED NOT DETECTED Final   Serratia marcescens NOT DETECTED NOT DETECTED Final   Haemophilus influenzae NOT DETECTED NOT DETECTED Final    Neisseria meningitidis NOT DETECTED NOT DETECTED Final   Pseudomonas aeruginosa NOT DETECTED NOT DETECTED Final   Candida albicans NOT DETECTED NOT DETECTED Final   Candida glabrata NOT DETECTED NOT DETECTED Final   Candida krusei NOT DETECTED NOT DETECTED Final   Candida parapsilosis NOT DETECTED NOT DETECTED Final   Candida tropicalis NOT DETECTED NOT DETECTED Final    Comment: Performed at [facility], [address], [place], [place] [address]  MRSA PCR Screening  Status: None   Collection Time: 02/27/18 12:21 PM  Result Value Ref Range Status   MRSA by PCR NEGATIVE NEGATIVE Final    Comment:        The GeneXpert MRSA Assay (FDA approved for NASAL specimens only), is one component of a comprehensive MRSA colonization surveillance program. It is not intended to diagnose MRSA infection nor to guide or monitor treatment for MRSA infections. Performed at [facility], [address], [place], [place] [address]   Blood Culture (routine x 2)     Status: None (Preliminary result)   Collection Time: 03/02/18  8:22 PM  Result Value Ref Range Status   Specimen Description BLOOD LEFT FOREARM  Final   Special Requests   Final    BOTTLES DRAWN AEROBIC AND ANAEROBIC Blood Culture results may not be optimal due to an inadequate volume of blood received in culture bottles   Culture   Final    NO GROWTH 4 DAYS Performed at [facility], [address] [place], [place] [address]    Report Status PENDING  Incomplete  Blood Culture (routine x 2)     Status: None (Preliminary result)   Collection Time: 03/02/18  8:22 PM  Result Value Ref Range Status   Specimen Description BLOOD LEFT HAND  Final   Special Requests   Final    BOTTLES DRAWN AEROBIC ONLY Blood Culture results may not be optimal due to an excessive volume of blood received in culture bottles   Culture   Final    NO GROWTH 4 DAYS Performed at [facility], [address], [place], [place] [address]     Report Status PENDING  Incomplete  MRSA PCR Screening     Status: None   Collection Time: 03/03/18  8:36 AM  Result Value Ref Range Status   MRSA by PCR NEGATIVE NEGATIVE Final    Comment:        The GeneXpert MRSA Assay (FDA approved for NASAL specimens only), is one component of a comprehensive MRSA colonization surveillance program. It is not intended to diagnose MRSA infection nor to guide or monitor treatment for MRSA infections. Performed at [facility], [address] [place], [place] [address]          Radiology Studies: Dg Chest 1 View  Result Date: 03/04/2018 CLINICAL DATA:  Dyspnea EXAM: CHEST  1 VIEW COMPARISON:  03/02/2018 FINDINGS: Stable cardiomegaly. Nonaneurysmal thoracic aorta. Diffuse bilateral airspace opacities at each lung base and left mid to upper lung may reflect stigmata of prior pneumonia or CHF depending on clinical presentation. PICC line catheter tip terminates at the cavoatrial junction. IMPRESSION: Patchy airspace opacities with cardiomegaly query CHF or multifocal pneumonia. Electronically Signed   By: Tollie Eth M.D.   On: 03/04/2018 23:54   Dg Chest Port 1 View  Result Date: 03/05/2018 CLINICAL DATA:  PICC line placement. EXAM: PORTABLE CHEST 1 VIEW COMPARISON:  03/04/2018 FINDINGS: RIGHT-sided PICC line has been placed, tip overlying the level of the superior vena cava. Shallow inflation. Heart size is accentuated by portable technique. Patchy parenchymal opacities persist, slightly improved at the RIGHT lung base. There is no pneumothorax following line placement. IMPRESSION: 1. RIGHT-sided PICC line tip overlying the superior vena cava. 2. Slight interval improvement in aeration of the RIGHT lung base. Electronically Signed   By: Norva Pavlov M.D.   On: 03/05/2018 11:52        Scheduled Meds: . chlorhexidine  15  mL Mouth Rinse BID  . heparin  5,000 Units Subcutaneous Q8H  . insulin aspart  0-5 Units Subcutaneous QHS  .  insulin aspart  0-9 Units Subcutaneous TID WC  . mouth rinse  15 mL Mouth Rinse q12n4p  . scopolamine  1 patch Transdermal Q72H   Continuous Infusions: . ceFEPime (MAXIPIME) IV 2 g (03/06/18 1115)     LOS: 4 days    Time spent: 35 mins.More than 50% of that time was spent in counseling and/or coordination of care.      Burnadette Pop, MD [facility] Pager [phone]  If 7PM-7AM, please contact night-coverage [email or web address] Password [email or web address] 03/06/2018, 1:14 PM

## 2018-03-06 NOTE — Care Management Important Message (Signed)
Important Message  Patient Details  Name: Candice Woodward MRN: [id number] Date of Birth: 01/23/1935   Medicare Important Message Given:  Yes    Kensli Bowley Stefan ChurchBratton 03/06/2018, 1:55 PM

## 2018-03-06 NOTE — Progress Notes (Signed)
  Speech Language Pathology Treatment: Dysphagia  Patient Details Name: Candice Woodward MRN: [id number] DOB: 01/07/1935 Today's Date: 03/06/2018 Time: 1005-1100 SLP Time Calculation (min) (ACUTE ONLY): 55 min  Assessment / Plan / Recommendation Clinical Impression  SLP requested to come to room per Angelique Blonderenise, pt's daughter.  Pt on 3 liters nasal cannula this am; Sp02 100%, RR fluctuates 18-32.  Alert, eyes open.  Pt repositioned with HOB as upright as possible.  Provided with pudding, water from straw.  Pt's overall swallowing abilities remain consistent with behaviors noted during last week's admission: she recognizes/accepts boluses, actively manipulates them, initiates a swallow response.  There was no coughing today, but intermittent wheezing, increased WOB, at which time I encouraged [name] to cease feeding and allow her mother time to rest. There is ongoing likelihood of intermittent aspiration.  CXR from yesterday shows mild improvement - this is after being NPO for two days.   SLP service continues to provide education and support to PacificDenise, who is adamant that she will take her mother home again because she trusts no one else to care for her.    For today, allow pudding and water when pt is alert and VS are stable on nasal cannula.  Pt to be fed by daughter.  Maintain HOB elevated for 60 minutes after eating/drinking.    SLP will f/u next date for ongoing education, assessment of PO toleration. Daughter in agreement.    HPI HPI: 83 y.o. female with PMHx of severe Alzheimer's dementia, cared for at home by her daughter, admitted after witnessed aspiration event.  Pt put on NRB, abx, and per chart, is approaching baseline today.  Pt is being followed by home hospice.  She has a hx of chronic dysphagia.  She was last seen by our SLP service 08/08/16 - at that time, pt's daughter was managing strategies for swallowing/feeding quite well, and had good understanding of the impact of dementia on  swallowing.       SLP Plan  Continue with current plan of care       Recommendations  Liquids provided via: Straw Medication Administration: Via alternative means Supervision: Trained caregiver to feed patient Compensations: Small sips/bites;Slow rate Postural Changes and/or Swallow Maneuvers: Upright 30-60 min after meal                Oral Care Recommendations: Oral care QID Follow up Recommendations: 24 hour supervision/assistance SLP Visit Diagnosis: Dysphagia, unspecified (R13.10) Plan: Continue with current plan of care       GO                Blenda MountsCouture, Blanton Kardell Laurice 03/06/2018, 11:39 AM  Marchelle FolksAmanda L. Samson Fredericouture, MA CCC/SLP Acute Rehabilitation Services Office number [phone] Pager [phone]

## 2018-03-07 LAB — CULTURE, BLOOD (ROUTINE X 2)
Culture: NO GROWTH
Culture: NO GROWTH

## 2018-03-07 LAB — BASIC METABOLIC PANEL
ANION GAP: 12 (ref 5–15)
BUN: 5 mg/dL — ABNORMAL LOW (ref 8–23)
CO2: 32 mmol/L (ref 22–32)
Calcium: 8.6 mg/dL — ABNORMAL LOW (ref 8.9–10.3)
Chloride: 93 mmol/L — ABNORMAL LOW (ref 98–111)
Creatinine, Ser: 1.08 mg/dL — ABNORMAL HIGH (ref 0.44–1.00)
GFR calc Af Amer: 56 mL/min — ABNORMAL LOW (ref >60)
GFR calc non Af Amer: 48 mL/min — ABNORMAL LOW (ref >60)
Glucose, Bld: 144 mg/dL — ABNORMAL HIGH (ref 70–99)
Potassium: 3.1 mmol/L — ABNORMAL LOW (ref 3.5–5.1)
Sodium: 137 mmol/L (ref 135–145)

## 2018-03-07 LAB — GLUCOSE, CAPILLARY
Glucose-Capillary: 135 mg/dL — ABNORMAL HIGH (ref 70–99)
Glucose-Capillary: 138 mg/dL — ABNORMAL HIGH (ref 70–99)
Glucose-Capillary: 194 mg/dL — ABNORMAL HIGH (ref 70–99)
Glucose-Capillary: 180 mg/dL — ABNORMAL HIGH (ref 70–99)

## 2018-03-07 MED ORDER — METHYLPREDNISOLONE SODIUM SUCC 125 MG IJ SOLR: 60.0000 mg | Freq: Every day | INTRAMUSCULAR | Status: DC

## 2018-03-07 MED ORDER — MAGNESIUM SULFATE 2 GM/50ML IV SOLN
2.0000 g | Freq: Once | INTRAVENOUS | Status: AC
  Administered 2018-03-07: 2 g via INTRAVENOUS
  Filled 2018-03-07: qty 50

## 2018-03-07 MED ORDER — METHYLPREDNISOLONE SODIUM SUCC 40 MG IJ SOLR
40.0000 mg | Freq: Two times a day (BID) | INTRAMUSCULAR | Status: DC
  Administered 2018-03-07 – 2018-03-10 (×6): 40 mg via INTRAVENOUS
  Filled 2018-03-07 (×7): qty 1

## 2018-03-07 MED ORDER — POTASSIUM CHLORIDE 10 MEQ/100ML IV SOLN
10.0000 meq | INTRAVENOUS | Status: AC
  Administered 2018-03-07 (×5): 10 meq via INTRAVENOUS
  Filled 2018-03-07 (×5): qty 100

## 2018-03-07 MED ORDER — FUROSEMIDE 10 MG/ML IJ SOLN
40.0000 mg | Freq: Every day | INTRAMUSCULAR | Status: DC
  Administered 2018-03-08: 40 mg via INTRAVENOUS
  Filled 2018-03-07: qty 4

## 2018-03-07 MED ORDER — IPRATROPIUM-ALBUTEROL 0.5-2.5 (3) MG/3ML IN SOLN
3.0000 mL | Freq: Four times a day (QID) | RESPIRATORY_TRACT | Status: DC
  Administered 2018-03-07 – 2018-03-10 (×9): 3 mL via RESPIRATORY_TRACT
  Filled 2018-03-07 (×10): qty 3

## 2018-03-07 NOTE — Progress Notes (Signed)
Pharmacy Antibiotic Note  Candice Woodward is a 83 y.o. female admitted on 03/02/2018 with sepsis.  Pharmacy has been consulted for cefepime dosing. Patient is Day #6 of abx for sepsis.   Scr 0.91 CrCl 57.   Plan: Decrease Cefepime 2gm IV Q24 F/u renal fxn, C&S, clinical status and trough at SS  Height: 5\' 5"  (165.1 cm) Weight: 221 lb 6.4 oz (100.4 kg) IBW/kg (Calculated) : 57  Temp (24hrs), Avg:98.5 F (36.9 C), Min:98 F (36.7 C), Max:99 F (37.2 C)  Recent Labs  Lab 03/02/18 1949  03/02/18 2004 03/02/18 2217 03/03/18 1002 03/03/18 1908 03/03/18 2146 03/04/18 0640 03/04/18 2227 03/06/18 0400 03/07/18 0409  WBC 15.2*  --   --   --  11.6*  --   --  7.0  --  8.2  --   CREATININE 1.19*   < >  --   --  1.02*  --   --  0.91 0.91 1.08* 1.08*  LATICACIDVEN  --   --  5.15* 3.37*  --  1.2 0.9  --   --   --   --    < > = values in this interval not displayed.    Estimated Creatinine Clearance: 48 mL/min (A) (by C-G formula based on SCr of 1.08 mg/dL (H)).    Allergies  Allergen Reactions  . Lisinopril Swelling    Antimicrobials this admission: Vanc 11/30>>12/1 Cefepime 11/30>>  Dose adjustments this admission: N/A  Microbiology results: 11/30 blood>> 12/1 MRSA PCR>>neg  Brenley Priore A. Jeanella CrazePierce, PharmD, BCPS Clinical Pharmacist [place] Pager: [phone] Please utilize Amion for appropriate phone number to reach the unit pharmacist [facility])   03/07/2018 8:45 AM

## 2018-03-07 NOTE — Progress Notes (Signed)
PROGRESS NOTE    Candice Woodward  ZOX:[id number] DOB: 05/30/1934 DOA: 03/02/2018 PCP: Jiles GarterSheets, Angelique, FNP   Brief Narrative: Patient is 83 year old with past medical history of recurrent aspiration pneumonia, chronic respiratory failure, dementia on hospice, diabetes, was discharged from here 2 days ago and was brought back because of worsening dyspnea.  Patient was managed here for aspiration pneumonia and was discharged on comfort feeding followed by hospice.  On arrival, she was found to have severe lactic acidosis, acute respiratory failure and chest x-ray showed worsening bilateral multifocal pneumonia.  Currently she is being managed for acute respiratory failure secondary to multifocal pneumonia.  Palliative care has been consulted.  Assessment & Plan:   Principal Problem:   Sepsis, Gram negative (HCC) Active Problems:   Alzheimer disease (HCC)   Hypothyroidism   Essential hypertension   Acute respiratory failure with hypoxia (HCC)   AKI (acute kidney injury) (HCC)   Hyponatremia   Acute metabolic encephalopathy   Hyperkalemia   Adult failure to thrive   Palliative care by specialist   Dysphagia  Severe sepsis:Presented with severe lactic acidosis.  Sepsis secondary to aspiration pneumonia. .  Blood cultures have been sent.So far negative.  Acute on chronic hypoxic/hypercarbic respiratory failure: Secondary to bilateral multifocal pneumonia..  Had to be put on  BiPAP because of acute respiratory distress.  Our goal is to take off the BiPAP and put her on nasal cannula so that she can be started on feeding.She is off bipap now.  Last CXRshowed possible pulmonary edema. CT chest showed New dense consolidation within the RIGHT perihilar lung, consistent with pneumonia,Increased pleural effusion and atelectasis at the RIGHT lung base, small to moderate in size,Bilateral perihilar edema indicating CHF/volume overload. She is on lasix daily.Also started on steroids because of  persistent wheezing.  Recurrent aspiration pneumonia/Dysphagia: Chest x-ray on admission showed bilateral multifocal pneumonia.  Started on broad-spectrum antibiotics. Added Azithromycin. Currently off  BiPAP.  Anticipating speech therapy help  for initiation of feeding. Speech therapy closely following.  She has been eating now.  Hypokalemia: Being supplemented.  Acute kidney injury:Resolved  Metabolic encephalopathy: Associated with pneumonia, metabolic encephalopathy.  Patient is nonverbal on baseline.  Dementia:Nonverbal and confused at baseline.  Being followed with hospice at home.  Diabetes: Continue sliding-scale insulin for now.  Constipation: Had a bowel movement on 03/04/18.   Advanced age/multiple comorbidities/poor prognosis: We have discussed extensively with the daughter at the bedside daily.Patient is DNR.  Daughter wants ongoing  Treatment for pneumonia and says her goal is to take her mother home. She had denied  residential hospice or comfort care on my previous conversation.  Palliative care is also following.      DVT prophylaxis: SCD Code Status: DNR Family Communication: daughter Disposition Plan: Likely home with hospice in 1-2 days  Consultants: None  Procedures:None  Antimicrobials: Cefepime day 5,Azithro day 2 Subjective: Patient seen and examined at bedside this morning.  Looks more comfortable today.  Not in respiratory distress.  Eating.Off BiPAP.  Objective: Vitals:   03/07/18 0640 03/07/18 0753 03/07/18 0837 03/07/18 1130  BP: 137/70     Pulse: 82   83  Resp: (!) 32   (!) 28  Temp:  99 F (37.2 C)  98.5 F (36.9 C)  TempSrc:  Axillary  Oral  SpO2: 100%  100% 100%  Weight:      Height:        Intake/Output Summary (Last 24 hours) at 03/07/2018 1458 Last data filed at 03/07/2018  1039 Gross per 24 hour  Intake 250 ml  Output 3575 ml  Net -3325 ml   Filed Weights   03/02/18 2045 03/03/18 0828  Weight: 95.7 kg 100.4 kg     Examination:  General exam: Chronically ill looking, obese HEENT:PERRL,Oral mucosa moist, Ear/Nose normal on gross exam Respiratory system: Bilateral decreased air entry, bilateral coarse breathing sounds,crackles,wheezes Cardiovascular system: S1 & S2 heard, RRR. No JVD, murmurs, rubs, gallops or clicks. Pedal edema. Gastrointestinal system: Abdomen is distended, soft and nontender. No organomegaly or masses felt. Normal bowel sounds heard. Central nervous system: Not Alert and oriented.  Extremities: Pedal edema, no clubbing ,no cyanosis, distal peripheral pulses palpable.   Data Reviewed: I have personally reviewed following labs and imaging studies  CBC: Recent Labs  Lab 03/02/18 1949 03/02/18 2003 03/03/18 1002 03/04/18 0640 03/06/18 0400  WBC 15.2*  --  11.6* 7.0 8.2  NEUTROABS 9.8*  --  9.4* 5.0 5.9  HGB 12.7 13.9 10.7* 10.6* 10.1*  HCT 41.8 41.0 34.1* 33.7* 32.8*  MCV 89.5  --  86.8 87.5 88.2  PLT 330  --  203 180 208   Basic Metabolic Panel: Recent Labs  Lab 03/03/18 1002 03/04/18 0640 03/04/18 2227 03/06/18 0400 03/07/18 0409  NA 130* 132* 133* 139 137  K 4.2 3.8 3.8 3.8 3.1*  CL 92* 95* 96* 98 93*  CO2 30 28 28 29  32  GLUCOSE 174* 197* 163* 139* 144*  BUN 9 6* <5* 7* 5*  CREATININE 1.02* 0.91 0.91 1.08* 1.08*  CALCIUM 8.5* 8.3* 8.1* 8.5* 8.6*   GFR: Estimated Creatinine Clearance: 48 mL/min (A) (by C-G formula based on SCr of 1.08 mg/dL (H)). Liver Function Tests: Recent Labs  Lab 03/02/18 1949 03/03/18 1002  AST 50* 18  ALT 19 14  ALKPHOS 56 42  BILITOT 1.7* 0.6  PROT 6.6 6.0*  ALBUMIN 2.8* 2.6*   No results for input(s): LIPASE, AMYLASE in the last 168 hours. No results for input(s): AMMONIA in the last 168 hours. Coagulation Profile: Recent Labs  Lab 03/03/18 1002  INR 1.10   Cardiac Enzymes: Recent Labs  Lab 03/02/18 1949  TROPONINI 0.04*   BNP (last 3 results) No results for input(s): PROBNP in the last 8760  hours. HbA1C: No results for input(s): HGBA1C in the last 72 hours. CBG: Recent Labs  Lab 03/06/18 1139 03/06/18 1610 03/06/18 2003 03/07/18 0751 03/07/18 1206  GLUCAP 148* 122* 120* 135* 138*   Lipid Profile: No results for input(s): CHOL, HDL, LDLCALC, TRIG, CHOLHDL, LDLDIRECT in the last 72 hours. Thyroid Function Tests: No results for input(s): TSH, T4TOTAL, FREET4, T3FREE, THYROIDAB in the last 72 hours. Anemia Panel: No results for input(s): VITAMINB12, FOLATE, FERRITIN, TIBC, IRON, RETICCTPCT in the last 72 hours. Sepsis Labs: Recent Labs  Lab 03/02/18 2004 03/02/18 2217 03/03/18 1002 03/03/18 1908 03/03/18 2146  PROCALCITON  --   --  1.87  --   --   LATICACIDVEN 5.15* 3.37*  --  1.2 0.9    Recent Results (from the past 240 hour(s))  Blood Culture (routine x 2)     Status: None   Collection Time: 02/27/18 12:11 AM  Result Value Ref Range Status   Specimen Description BLOOD LEFT HAND  Final   Special Requests   Final    BOTTLES DRAWN AEROBIC AND ANAEROBIC Blood Culture adequate volume   Culture   Final    NO GROWTH 5 DAYS Performed at [facility], [address] [place],  [place] [address]    Report Status 03/04/2018 FINAL  Final  Blood Culture (routine x 2)     Status: Abnormal   Collection Time: 02/27/18 12:11 AM  Result Value Ref Range Status   Specimen Description BLOOD RIGHT WRIST  Final   Special Requests   Final    BOTTLES DRAWN AEROBIC ONLY Blood Culture adequate volume   Culture  Setup Time   Final    GRAM POSITIVE COCCI IN CLUSTERS AEROBIC BOTTLE ONLY Organism ID to follow CRITICAL RESULT CALLED TO, READ BACK BY AND VERIFIED WITH: H.BAIRD,PHARMD AT 2143 ON 02/27/18 BY G.MCADOO    Culture (A)  Final    STAPHYLOCOCCUS SPECIES (COAGULASE NEGATIVE) THE SIGNIFICANCE OF ISOLATING THIS ORGANISM FROM A SINGLE SET OF BLOOD CULTURES WHEN MULTIPLE SETS ARE DRAWN IS UNCERTAIN. PLEASE NOTIFY THE MICROBIOLOGY DEPARTMENT WITHIN ONE WEEK IF SPECIATION  AND SENSITIVITIES ARE REQUIRED. Performed at [facility], [address], [place], [place] [address]    Report Status 03/01/2018 FINAL  Final  Urine culture     Status: None   Collection Time: 02/27/18 12:11 AM  Result Value Ref Range Status   Specimen Description URINE, RANDOM  Final   Special Requests NONE  Final   Culture   Final    NO GROWTH Performed at [facility], [address] [place], [place] [address]    Report Status 02/28/2018 FINAL  Final  Blood Culture ID Panel (Reflexed)     Status: Abnormal   Collection Time: 02/27/18 12:11 AM  Result Value Ref Range Status   Enterococcus species NOT DETECTED NOT DETECTED Final   Listeria monocytogenes NOT DETECTED NOT DETECTED Final   Staphylococcus species DETECTED (A) NOT DETECTED Final    Comment: Methicillin (oxacillin) resistant coagulase negative staphylococcus. Possible blood culture contaminant (unless isolated from more than one blood culture draw or clinical case suggests pathogenicity). No antibiotic treatment is indicated for blood  culture contaminants. CRITICAL RESULT CALLED TO, READ BACK BY AND VERIFIED WITH: H.BAIRD,PHARMD AT 2143 ON 02/27/18 BY G.MCADOO    Staphylococcus aureus (BCID) NOT DETECTED NOT DETECTED Final   Methicillin resistance DETECTED (A) NOT DETECTED Final    Comment: CRITICAL RESULT CALLED TO, READ BACK BY AND VERIFIED WITH: H.BAIRD,PHARMD AT 2143 ON 02/27/18 BY G.MCADOO    Streptococcus species NOT DETECTED NOT DETECTED Final   Streptococcus agalactiae NOT DETECTED NOT DETECTED Final   Streptococcus pneumoniae NOT DETECTED NOT DETECTED Final   Streptococcus pyogenes NOT DETECTED NOT DETECTED Final   Acinetobacter baumannii NOT DETECTED NOT DETECTED Final   Enterobacteriaceae species NOT DETECTED NOT DETECTED Final   Enterobacter cloacae complex NOT DETECTED NOT DETECTED Final   Escherichia coli NOT DETECTED NOT DETECTED Final   Klebsiella oxytoca NOT DETECTED NOT DETECTED Final    Klebsiella pneumoniae NOT DETECTED NOT DETECTED Final   Proteus species NOT DETECTED NOT DETECTED Final   Serratia marcescens NOT DETECTED NOT DETECTED Final   Haemophilus influenzae NOT DETECTED NOT DETECTED Final   Neisseria meningitidis NOT DETECTED NOT DETECTED Final   Pseudomonas aeruginosa NOT DETECTED NOT DETECTED Final   Candida albicans NOT DETECTED NOT DETECTED Final   Candida glabrata NOT DETECTED NOT DETECTED Final   Candida krusei NOT DETECTED NOT DETECTED Final   Candida parapsilosis NOT DETECTED NOT DETECTED Final   Candida tropicalis NOT DETECTED NOT DETECTED Final    Comment: Performed at [facility], [address] [place], [place] [address]  MRSA PCR Screening  Status: None   Collection Time: 02/27/18 12:21 PM  Result Value Ref Range Status   MRSA by PCR NEGATIVE NEGATIVE Final    Comment:        The GeneXpert MRSA Assay (FDA approved for NASAL specimens only), is one component of a comprehensive MRSA colonization surveillance program. It is not intended to diagnose MRSA infection nor to guide or monitor treatment for MRSA infections. Performed at [facility], [address], [place], [place] [address]   Blood Culture (routine x 2)     Status: None   Collection Time: 03/02/18  8:22 PM  Result Value Ref Range Status   Specimen Description BLOOD LEFT FOREARM  Final   Special Requests   Final    BOTTLES DRAWN AEROBIC AND ANAEROBIC Blood Culture results may not be optimal due to an inadequate volume of blood received in culture bottles   Culture   Final    NO GROWTH 5 DAYS Performed at [facility] Lab, [address], [place], [place] [address]    Report Status 03/07/2018 FINAL  Final  Blood Culture (routine x 2)     Status: None   Collection Time: 03/02/18  8:22 PM  Result Value Ref Range Status   Specimen Description BLOOD LEFT HAND  Final   Special Requests   Final    BOTTLES DRAWN AEROBIC ONLY Blood Culture results may not be  optimal due to an excessive volume of blood received in culture bottles   Culture   Final    NO GROWTH 5 DAYS Performed at [facility], [address] [place], [place] [address]    Report Status 03/07/2018 FINAL  Final  MRSA PCR Screening     Status: None   Collection Time: 03/03/18  8:36 AM  Result Value Ref Range Status   MRSA by PCR NEGATIVE NEGATIVE Final    Comment:        The GeneXpert MRSA Assay (FDA approved for NASAL specimens only), is one component of a comprehensive MRSA colonization surveillance program. It is not intended to diagnose MRSA infection nor to guide or monitor treatment for MRSA infections. Performed at [facility], [address] [place], [place] [address]          Radiology Studies: Ct Chest Wo Contrast  Result Date: 03/06/2018 CLINICAL DATA:  Shortness of breath, pulmonary edema suspected severe lactic acidosis, acute respiratory failure. Chest x-ray showing worsening multifocal pneumonia. History of aspiration pneumonia. EXAM: CT CHEST WITHOUT CONTRAST TECHNIQUE: Multidetector CT imaging of the chest was performed following the standard protocol without IV contrast. COMPARISON:  Chest x-rays dated 03/05/2018 and 03/04/2018. Chest CT dated 01/09/2018. FINDINGS: Cardiovascular: Cardiomegaly. No pericardial effusion. No thoracic aortic aneurysm. Scattered coronary artery calcifications. Mediastinum/Nodes: No mass or enlarged lymph nodes within the mediastinum. Esophagus appears normal. Trachea appears normal. Lungs/Pleura: New dense consolidation within the RIGHT perihilar lung, bordering the fissure. Persistent prominent peribronchial thickening bilaterally. Increased pleural effusion and atelectasis at the RIGHT lung base, small to moderate in size. Milder atelectasis at the LEFT lung base. Additional bilateral perihilar edema. Upper Abdomen: No acute abnormality. Musculoskeletal: No acute or suspicious osseous finding. Degenerative  spondylosis throughout the slightly scoliotic thoracolumbar spine, mild to moderate in degree. Additional degenerative change at the bilateral shoulders, LEFT greater than RIGHT. IMPRESSION: 1. New dense consolidation within the RIGHT perihilar lung, consistent with pneumonia. 2. Increased pleural effusion and atelectasis at the RIGHT lung base, small to moderate in size.  3. Bilateral perihilar edema indicating CHF/volume overload. Electronically Signed   By: Bary Richard M.D.   On: 03/06/2018 16:49        Scheduled Meds: . chlorhexidine  15 mL Mouth Rinse BID  . furosemide  40 mg Intravenous BID  . heparin  5,000 Units Subcutaneous Q8H  . insulin aspart  0-5 Units Subcutaneous QHS  . insulin aspart  0-9 Units Subcutaneous TID WC  . mouth rinse  15 mL Mouth Rinse q12n4p  . scopolamine  1 patch Transdermal Q72H   Continuous Infusions: . azithromycin 500 mg (03/07/18 0000)  . ceFEPime (MAXIPIME) IV 2 g (03/07/18 1039)  . magnesium sulfate 1 - 4 g bolus IVPB    . potassium chloride       LOS: 5 days    Time spent: 35 mins.More than 50% of that time was spent in counseling and/or coordination of care.      Burnadette Pop, MD [facility] Pager [phone]  If 7PM-7AM, please contact night-coverage [email or web address] Password [email or web address] 03/07/2018, 2:58 PM

## 2018-03-07 NOTE — Progress Notes (Signed)
[facility]) GIP RN Visit@ 1000  This is a related and covered GIP admission of12/1with [facility] diagnosis ofAlzheimer's diseaseper Dr. Jamie BrookesMonguilod. Patient has OOF DNR in home.[facility] was at home for approximately one hour, daughter was bathing her and noticed that the pt was turning blue and unresponsive. EMS was activated, reported SPO2 in the 30s, bipap was initiated and pt was transported to ED.  Pt admitted with acute respiratory failure.  Visited patient at bedside. Daughter [name] is present. Patient does not respond to my voice. She appears to be sleeping. Daughter reports patient had some diarrhea during the night. Nurse reports no BiPAP last 24 hours.  - SpO2 100% on 3Lnc, 137/7, HR 82, resp 26.  - Foley catheter draining clear yellow urine -Some moisture related skin damage -High risk for aspiration. NPO except for sips and pudding allowed.  - Bilateral multifocal pneumonia with possible pulmonary edema  Medications: Zithromax 500mg  IVPB QD, Maxipime 2g IVPB QD. DUONEB 0.5-2.5mg  PRN Q 4hrs @ 0837.   Per MD notes on 03/06/2018: -Severe sepsis:Presented with severe lactic acidosis.  Sepsis secondary to aspiration pneumonia. .  Blood cultures have been sent.So far negative.  -Acute on chronic hypoxic/hypercarbic respiratory failure: Secondary to bilateral multifocal pneumonia..  Had to be put on  BiPAP because of acute respiratory distress.  Our goal is to take off the BiPAP and put her on nasal cannula so that she can be started on feeding.She is off bipap now.  Last CXRshowed possible pulmonary edema.  Given Lasix 40mg  on 03/05/18.Will check CT chest today.  -Recurrent aspiration pneumonia/Dysphagia: Chest x-ray showed bilateral multifocal pneumonia.  Started on broad-spectrum antibiotics.  Currently off  BiPAP.  Anticipating speech therapy help  for initiation of feeding.We will get a CT chest. Speech  therapy closely following.    -Metabolic encephalopathy: Associated with pneumonia, metabolic encephalopathy.  Patient is nonverbal on baseline.  Goals of care:Ongoing. Goals of care has been approached with Angelique Blonderenise (dtr and caregiver), she is not receptive to proceeding with comfort care, but refusing aggressive care. She wants to get her mother well enough to return home.Even if her mother does not return to baseline, she wants to take her home. She would not want residential hospice.  Discussion with RNCM about MOST form, and having PMT re-discuss this with her during this admission.  PMT is following as well and assisting with ongoing discussions and GOC.  Communication withPCG: Discussion at bedside  Communication with JYN:[id number]:completed  [facility] at time of discharge.   Please call with any hospice related questions or concerns.  Elsie SaasMary Anne Robertson, RN, [facility] Hospital Liaison (listed in [place]) [phone]

## 2018-03-07 NOTE — Progress Notes (Signed)
  Speech Language Pathology Treatment: Dysphagia  Patient Details Name: Candice Woodward MRN: [id number] DOB: 06/01/1934 Today's Date: 03/07/2018 Time: 1600-1610 SLP Time Calculation (min) (ACUTE ONLY): 10 min  Assessment / Plan / Recommendation Clinical Impression  [name] at bedside.  Pt sleeping soundly and appears comfortable.  Per [name]'s report, her mother tolerated POs better last night and today with no wheezing/no coughing. Lung sounds are improved.  We discussed obtaining home suctioning; demonstrated to KelayresDenise how to use the [place] (she has been reluctant to use).  I wrote orders to advance diet back to dysphagia 1, thins, her baseline diet.  [name] is at the bedside nearly constantly and prefers to be the person who feeds her mother.  For potential D/C home this week according to RN.  SLP to f/u tomorrow.   HPI HPI: 83 y.o. female with PMHx of severe Alzheimer's dementia, cared for at home by her daughter, admitted after witnessed aspiration event.  Pt put on NRB, abx, and per chart, is approaching baseline today.  Pt is being followed by home hospice.  She has a hx of chronic dysphagia.  She was last seen by our SLP service 08/08/16 - at that time, pt's daughter was managing strategies for swallowing/feeding quite well, and had good understanding of the impact of dementia on swallowing.       SLP Plan  Continue with current plan of care       Recommendations  Diet recommendations: Dysphagia 1 (puree);Thin liquid Liquids provided via: Straw Medication Administration: Crushed with puree Supervision: Trained caregiver to feed patient Compensations: Small sips/bites;Slow rate Postural Changes and/or Swallow Maneuvers: Upright 30-60 min after meal                Oral Care Recommendations: Oral care QID Follow up Recommendations: 24 hour supervision/assistance SLP Visit Diagnosis: Dysphagia, unspecified (R13.10) Plan: Continue with current plan of care       GO                 Blenda MountsCouture, Sarah-Jane Nazario Laurice 03/07/2018, 4:30 PM Schneur Crowson L. Samson Fredericouture, MA CCC/SLP Acute Rehabilitation Services Office number [phone] Pager [phone]

## 2018-03-07 NOTE — Clinical Social Work Note (Signed)
In to see pt; dtr at bedside. Today is pt's 83st birthday. Hospice volunteers prepared cake; LCSW also brought volunteer-made quilt and bone pillow. LCSW offered supportive counseling to dtr; dtr anticipates d/c this weekend. [facility] homecare will continue to follow at home. Dtr Angelique Blonderenise doing life review with her son, who is [employer] freshman; they agree on wish for pt's home death. RN and Doctor, general practicespeech pathologist in, offering info, update, support.

## 2018-03-07 NOTE — Plan of Care (Signed)

## 2018-03-08 LAB — MAGNESIUM: Magnesium: 2.2 mg/dL (ref 1.7–2.4)

## 2018-03-08 LAB — CBC WITH DIFFERENTIAL/PLATELET
Abs Immature Granulocytes: 0.05 10*3/uL (ref 0.00–0.07)
Basophils Absolute: 0 10*3/uL (ref 0.0–0.1)
Basophils Relative: 0 %
Eosinophils Absolute: 0 10*3/uL (ref 0.0–0.5)
Eosinophils Relative: 0 %
HCT: 32.2 % — ABNORMAL LOW (ref 36.0–46.0)
Hemoglobin: 10.5 g/dL — ABNORMAL LOW (ref 12.0–15.0)
Immature Granulocytes: 1 %
Lymphocytes Relative: 9 %
Lymphs Abs: 0.8 10*3/uL (ref 0.7–4.0)
MCH: 28.4 pg (ref 26.0–34.0)
MCHC: 32.6 g/dL (ref 30.0–36.0)
MCV: 87 fL (ref 80.0–100.0)
Monocytes Absolute: 0.3 10*3/uL (ref 0.1–1.0)
Monocytes Relative: 4 %
Neutro Abs: 7.5 10*3/uL (ref 1.7–7.7)
Neutrophils Relative %: 86 %
Platelets: 201 10*3/uL (ref 150–400)
RBC: 3.7 MIL/uL — ABNORMAL LOW (ref 3.87–5.11)
RDW: 14.8 % (ref 11.5–15.5)
WBC: 8.7 10*3/uL (ref 4.0–10.5)
nRBC: 0 % (ref 0.0–0.2)

## 2018-03-08 LAB — BASIC METABOLIC PANEL
Anion gap: 10 (ref 5–15)
BUN: 7 mg/dL — ABNORMAL LOW (ref 8–23)
CO2: 31 mmol/L (ref 22–32)
Calcium: 8.6 mg/dL — ABNORMAL LOW (ref 8.9–10.3)
Chloride: 95 mmol/L — ABNORMAL LOW (ref 98–111)
Creatinine, Ser: 1.08 mg/dL — ABNORMAL HIGH (ref 0.44–1.00)
GFR calc Af Amer: 56 mL/min — ABNORMAL LOW (ref >60)
GFR calc non Af Amer: 48 mL/min — ABNORMAL LOW (ref >60)
Glucose, Bld: 220 mg/dL — ABNORMAL HIGH (ref 70–99)
Potassium: 3.9 mmol/L (ref 3.5–5.1)
Sodium: 136 mmol/L (ref 135–145)

## 2018-03-08 LAB — GLUCOSE, CAPILLARY
GLUCOSE-CAPILLARY: 179 mg/dL — AB (ref 70–99)
Glucose-Capillary: 209 mg/dL — ABNORMAL HIGH (ref 70–99)
Glucose-Capillary: 274 mg/dL — ABNORMAL HIGH (ref 70–99)
Glucose-Capillary: 234 mg/dL — ABNORMAL HIGH (ref 70–99)

## 2018-03-08 MED ORDER — FUROSEMIDE 40 MG PO TABS
40.0000 mg | ORAL_TABLET | Freq: Every day | ORAL | Status: DC
  Administered 2018-03-09 – 2018-03-10 (×2): 40 mg via ORAL
  Filled 2018-03-08 (×2): qty 1

## 2018-03-08 MED ORDER — AMOXICILLIN-POT CLAVULANATE 875-125 MG PO TABS
1.0000 | ORAL_TABLET | Freq: Two times a day (BID) | ORAL | Status: DC
  Administered 2018-03-08 – 2018-03-09 (×3): 1 via ORAL
  Filled 2018-03-08 (×3): qty 1

## 2018-03-08 NOTE — Progress Notes (Addendum)
[facility] 5W29[facility] [place] ([facility]) GIP RN Visit@ 1100  This is a related and covered GIP admission of12/1with [facility] diagnosis ofAlzheimer's diseaseper Dr. Jamie BrookesMonguilod. Patient has OOF DNR in home.[facility], was at home for approximately one hour, daughter was bathing her and noticed that the pt was turning blue and unresponsive. EMS was activated, reported SPO2 in the 30s, bipap was initiated and pt was transported to ED.  Pt admitted with acute respiratory failure.  Visited patient at bedside. She is sitting up more and appears more alert although she does not respond to my voice. Spoke with RN who states she has a pureed diet ordered but she will not eat for hospital staff.   - SpO2 98% on 3Lnc, 113/67, HR 77, resp 24. .  - Foley catheter draining clear yellow urine - will eat small amounts if daughter feeds her -continues to be high risk for aspiration. -possible discharge over next 2 days per daughter.   Medications: Zithromax 500mg  IVPB QD, Maxipime 2g IVPB QD.  Goals of care:Ongoing. Goals of care has been approached with Angelique Blonderenise (dtr and caregiver), she is not receptive to proceeding with comfort care, but refusing aggressive care. She wants to get her mother well enough to return home.Even if her mother does not return to baseline, she wants to take her home. She would not want residential hospice. Discussion with RNCM about MOST form, and having PMT re-discuss this with her during this admission.  PMT is following as well and assisting with ongoing discussions and GOC.  Communication withPCG: Spoke with daughter by phone. Daughter wants patient to return home when stable.   Communication with ZOX:[id number]:completed  Please use [facility] at time of discharge.   Please call with any hospice related questions or concerns.  Elsie SaasMary Anne Robertson, RN, [facility] Hospital Liaison (listed in  [place]) [phone]

## 2018-03-08 NOTE — Progress Notes (Signed)
PROGRESS NOTE    Candice ArtisLois B Woodward  OZH:[id number] DOB: 02/18/1935 DOA: 03/02/2018 PCP: Jiles GarterSheets, Angelique, FNP   Brief Narrative: Patient is 83 year old with past medical history of recurrent aspiration pneumonia, chronic respiratory failure, dementia on hospice, diabetes, was discharged from here 2 days ago and was brought back because of worsening dyspnea.  Patient was managed here for aspiration pneumonia and was discharged on comfort feeding followed by hospice.  On arrival, she was found to have severe lactic acidosis, acute respiratory failure and chest x-ray showed worsening bilateral multifocal pneumonia.  Currently she is being managed for acute respiratory failure secondary to multifocal pneumonia.  Palliative care has been consulted.  Her overall status is improved.  Planning for discharge home with hospice tomorrow  Assessment & Plan:   Principal Problem:   Sepsis, Gram negative (HCC) Active Problems:   Alzheimer disease (HCC)   Hypothyroidism   Essential hypertension   Acute respiratory failure with hypoxia (HCC)   AKI (acute kidney injury) (HCC)   Hyponatremia   Acute metabolic encephalopathy   Hyperkalemia   Adult failure to thrive   Palliative care by specialist   Dysphagia  Severe sepsis:Presented with severe lactic acidosis.  Sepsis secondary to aspiration pneumonia. .  Blood cultures have been sent.So far negative.Changed antibiotics to oral.  Acute on chronic hypoxic/hypercarbic respiratory failure: Secondary to bilateral multifocal pneumonia..  Had to be put on  BiPAP because of acute respiratory distress. She is off bipap now.  Last CXR showed possible pulmonary edema. CT chest showed New dense consolidation within the RIGHT perihilar lung, consistent with pneumonia,Increased pleural effusion and atelectasis at the RIGHT lung base, small to moderate in size,Bilateral perihilar edema indicating CHF/volume overload. She is on lasix daily.Also started on steroids because  of persistent wheezing.Respiratory status has significantly improved.  Recurrent aspiration pneumonia/Dysphagia: Chest x-ray on admission showed bilateral multifocal pneumonia.  Started on broad-spectrum antibiotics.On dysphagia 1,thin liquid.  She has been eating now.  Hypokalemia: Being supplemented.  Acute kidney injury:Resolved  Metabolic encephalopathy: Associated with pneumonia, metabolic encephalopathy.  Patient is nonverbal on baseline.  Dementia:Nonverbal and confused at baseline.  Being followed with hospice at home.  Diabetes: Continue sliding-scale insulin for now.  Constipation: Had a bowel movement on 03/04/18.   Advanced age/multiple comorbidities/poor prognosis: We have discussed extensively with the daughter at the bedside daily.Patient is DNR.  Daughter wants ongoing  treatment for pneumonia and says her goal is to take her mother home. She had denied  residential hospice or comfort care on my previous conversation.  Palliative care is also following.      DVT prophylaxis: SCD Code Status: DNR Family Communication: daughter Disposition Plan: Likely home with hospice tomorrow  Consultants: None  Procedures:None  Antimicrobials:Augmentin Subjective: Patient seen and examined at bedside this morning.  Looks more comfortable today.  Not in respiratory distress.  Eating.Off BiPAP.  Objective: Vitals:   03/08/18 0433 03/08/18 0813 03/08/18 0925 03/08/18 1145  BP: 102/63  113/67   Pulse: 68  77   Resp: 19  (!) 24   Temp: 98.1 F (36.7 C) (!) 97.4 F (36.3 C)  (!) 97.5 F (36.4 C)  TempSrc: Oral Axillary  Axillary  SpO2: 100%  98%   Weight:      Height:        Intake/Output Summary (Last 24 hours) at 03/08/2018 1351 Last data filed at 03/08/2018 0650 Gross per 24 hour  Intake -  Output 650 ml  Net -650 ml   [employer]  03/02/18 2045 03/03/18 0828  Weight: 95.7 kg 100.4 kg    Examination:  General exam: Chronically ill looking,  obese HEENT:PERRL,Oral mucosa moist, Ear/Nose normal on gross exam Respiratory system: Bilateral decreased air entry,Cardiovascular system: S1 & S2 heard, RRR. No JVD, murmurs, rubs, gallops or clicks. Pedal edema. Gastrointestinal system: Abdomen is distended, soft and nontender. No organomegaly or masses felt. Normal bowel sounds heard. Central nervous system: Not Alert and oriented.  Extremities: No edema, no clubbing ,no cyanosis, distal peripheral pulses palpable.   Data Reviewed: I have personally reviewed following labs and imaging studies  CBC: Recent Labs  Lab 03/02/18 1949 03/02/18 2003 03/03/18 1002 03/04/18 0640 03/06/18 0400 03/08/18 0328  WBC 15.2*  --  11.6* 7.0 8.2 8.7  NEUTROABS 9.8*  --  9.4* 5.0 5.9 7.5  HGB 12.7 13.9 10.7* 10.6* 10.1* 10.5*  HCT 41.8 41.0 34.1* 33.7* 32.8* 32.2*  MCV 89.5  --  86.8 87.5 88.2 87.0  PLT 330  --  203 180 208 201   Basic Metabolic Panel: Recent Labs  Lab 03/04/18 0640 03/04/18 2227 03/06/18 0400 03/07/18 0409 03/08/18 0328  NA 132* 133* 139 137 136  K 3.8 3.8 3.8 3.1* 3.9  CL 95* 96* 98 93* 95*  CO2 28 28 29  32 31  GLUCOSE 197* 163* 139* 144* 220*  BUN 6* <5* 7* 5* 7*  CREATININE 0.91 0.91 1.08* 1.08* 1.08*  CALCIUM 8.3* 8.1* 8.5* 8.6* 8.6*  MG  --   --   --   --  2.2   GFR: Estimated Creatinine Clearance: 48 mL/min (A) (by C-G formula based on SCr of 1.08 mg/dL (H)). Liver Function Tests: Recent Labs  Lab 03/02/18 1949 03/03/18 1002  AST 50* 18  ALT 19 14  ALKPHOS 56 42  BILITOT 1.7* 0.6  PROT 6.6 6.0*  ALBUMIN 2.8* 2.6*   No results for input(s): LIPASE, AMYLASE in the last 168 hours. No results for input(s): AMMONIA in the last 168 hours. Coagulation Profile: Recent Labs  Lab 03/03/18 1002  INR 1.10   Cardiac Enzymes: Recent Labs  Lab 03/02/18 1949  TROPONINI 0.04*   BNP (last 3 results) No results for input(s): PROBNP in the last 8760 hours. HbA1C: No results for input(s): HGBA1C in the  last 72 hours. CBG: Recent Labs  Lab 03/07/18 1206 03/07/18 1715 03/07/18 2246 03/08/18 0812 03/08/18 1143  GLUCAP 138* 194* 180* 179* 209*   Lipid Profile: No results for input(s): CHOL, HDL, LDLCALC, TRIG, CHOLHDL, LDLDIRECT in the last 72 hours. Thyroid Function Tests: No results for input(s): TSH, T4TOTAL, FREET4, T3FREE, THYROIDAB in the last 72 hours. Anemia Panel: No results for input(s): VITAMINB12, FOLATE, FERRITIN, TIBC, IRON, RETICCTPCT in the last 72 hours. Sepsis Labs: Recent Labs  Lab 03/02/18 2004 03/02/18 2217 03/03/18 1002 03/03/18 1908 03/03/18 2146  PROCALCITON  --   --  1.87  --   --   LATICACIDVEN 5.15* 3.37*  --  1.2 0.9    Recent Results (from the past 240 hour(s))  Blood Culture (routine x 2)     Status: None   Collection Time: 02/27/18 12:11 AM  Result Value Ref Range Status   Specimen Description BLOOD LEFT HAND  Final   Special Requests   Final    BOTTLES DRAWN AEROBIC AND ANAEROBIC Blood Culture adequate volume   Culture   Final    NO GROWTH 5 DAYS Performed at [facility], [address] [place], [place] [address]  Report Status 03/04/2018 FINAL  Final  Blood Culture (routine x 2)     Status: Abnormal   Collection Time: 02/27/18 12:11 AM  Result Value Ref Range Status   Specimen Description BLOOD RIGHT WRIST  Final   Special Requests   Final    BOTTLES DRAWN AEROBIC ONLY Blood Culture adequate volume   Culture  Setup Time   Final    GRAM POSITIVE COCCI IN CLUSTERS AEROBIC BOTTLE ONLY Organism ID to follow CRITICAL RESULT CALLED TO, READ BACK BY AND VERIFIED WITH: H.BAIRD,PHARMD AT 2143 ON 02/27/18 BY G.MCADOO    Culture (A)  Final    STAPHYLOCOCCUS SPECIES (COAGULASE NEGATIVE) THE SIGNIFICANCE OF ISOLATING THIS ORGANISM FROM A SINGLE SET OF BLOOD CULTURES WHEN MULTIPLE SETS ARE DRAWN IS UNCERTAIN. PLEASE NOTIFY THE MICROBIOLOGY DEPARTMENT WITHIN ONE WEEK IF SPECIATION AND SENSITIVITIES ARE REQUIRED. Performed at [facility], [address] [place], [place] [address]    Report Status 03/01/2018 FINAL  Final  Urine culture     Status: None   Collection Time: 02/27/18 12:11 AM  Result Value Ref Range Status   Specimen Description URINE, RANDOM  Final   Special Requests NONE  Final   Culture   Final    NO GROWTH Performed at [facility], [address], [place], [place] [address]    Report Status 02/28/2018 FINAL  Final  Blood Culture ID Panel (Reflexed)     Status: Abnormal   Collection Time: 02/27/18 12:11 AM  Result Value Ref Range Status   Enterococcus species NOT DETECTED NOT DETECTED Final   Listeria monocytogenes NOT DETECTED NOT DETECTED Final   Staphylococcus species DETECTED (A) NOT DETECTED Final    Comment: Methicillin (oxacillin) resistant coagulase negative staphylococcus. Possible blood culture contaminant (unless isolated from more than one blood culture draw or clinical case suggests pathogenicity). No antibiotic treatment is indicated for blood  culture contaminants. CRITICAL RESULT CALLED TO, READ BACK BY AND VERIFIED WITH: H.BAIRD,PHARMD AT 2143 ON 02/27/18 BY G.MCADOO    Staphylococcus aureus (BCID) NOT DETECTED NOT DETECTED Final   Methicillin resistance DETECTED (A) NOT DETECTED Final    Comment: CRITICAL RESULT CALLED TO, READ BACK BY AND VERIFIED WITH: H.BAIRD,PHARMD AT 2143 ON 02/27/18 BY G.MCADOO    Streptococcus species NOT DETECTED NOT DETECTED Final   Streptococcus agalactiae NOT DETECTED NOT DETECTED Final   Streptococcus pneumoniae NOT DETECTED NOT DETECTED Final   Streptococcus pyogenes NOT DETECTED NOT DETECTED Final   Acinetobacter baumannii NOT DETECTED NOT DETECTED Final   Enterobacteriaceae species NOT DETECTED NOT DETECTED Final   Enterobacter cloacae complex NOT DETECTED NOT DETECTED Final   Escherichia coli NOT DETECTED NOT DETECTED Final   Klebsiella oxytoca NOT DETECTED NOT DETECTED Final   Klebsiella pneumoniae NOT DETECTED NOT DETECTED Final     Proteus species NOT DETECTED NOT DETECTED Final   Serratia marcescens NOT DETECTED NOT DETECTED Final   Haemophilus influenzae NOT DETECTED NOT DETECTED Final   Neisseria meningitidis NOT DETECTED NOT DETECTED Final   Pseudomonas aeruginosa NOT DETECTED NOT DETECTED Final   Candida albicans NOT DETECTED NOT DETECTED Final   Candida glabrata NOT DETECTED NOT DETECTED Final   Candida krusei NOT DETECTED NOT DETECTED Final   Candida parapsilosis NOT DETECTED NOT DETECTED Final   Candida tropicalis NOT DETECTED NOT DETECTED Final    Comment: Performed at [facility], [address] [place], [place] [address]  MRSA PCR Screening     Status: None  Collection Time: 02/27/18 12:21 PM  Result Value Ref Range Status   MRSA by PCR NEGATIVE NEGATIVE Final    Comment:        The GeneXpert MRSA Assay (FDA approved for NASAL specimens only), is one component of a comprehensive MRSA colonization surveillance program. It is not intended to diagnose MRSA infection nor to guide or monitor treatment for MRSA infections. Performed at [facility], [address] [place], [place] [address]   Blood Culture (routine x 2)     Status: None   Collection Time: 03/02/18  8:22 PM  Result Value Ref Range Status   Specimen Description BLOOD LEFT FOREARM  Final   Special Requests   Final    BOTTLES DRAWN AEROBIC AND ANAEROBIC Blood Culture results may not be optimal due to an inadequate volume of blood received in culture bottles   Culture   Final    NO GROWTH 5 DAYS Performed at [facility], [address], [place], [place] [address]    Report Status 03/07/2018 FINAL  Final  Blood Culture (routine x 2)     Status: None   Collection Time: 03/02/18  8:22 PM  Result Value Ref Range Status   Specimen Description BLOOD LEFT HAND  Final   Special Requests   Final    BOTTLES DRAWN AEROBIC ONLY Blood Culture results may not be optimal due to an excessive volume of blood received in  culture bottles   Culture   Final    NO GROWTH 5 DAYS Performed at [facility], [address], [place], [place] [address]    Report Status 03/07/2018 FINAL  Final  MRSA PCR Screening     Status: None   Collection Time: 03/03/18  8:36 AM  Result Value Ref Range Status   MRSA by PCR NEGATIVE NEGATIVE Final    Comment:        The GeneXpert MRSA Assay (FDA approved for NASAL specimens only), is one component of a comprehensive MRSA colonization surveillance program. It is not intended to diagnose MRSA infection nor to guide or monitor treatment for MRSA infections. Performed at [facility], [address] [place], [place] [address]          Radiology Studies: Ct Chest Wo Contrast  Result Date: 03/06/2018 CLINICAL DATA:  Shortness of breath, pulmonary edema suspected severe lactic acidosis, acute respiratory failure. Chest x-ray showing worsening multifocal pneumonia. History of aspiration pneumonia. EXAM: CT CHEST WITHOUT CONTRAST TECHNIQUE: Multidetector CT imaging of the chest was performed following the standard protocol without IV contrast. COMPARISON:  Chest x-rays dated 03/05/2018 and 03/04/2018. Chest CT dated 01/09/2018. FINDINGS: Cardiovascular: Cardiomegaly. No pericardial effusion. No thoracic aortic aneurysm. Scattered coronary artery calcifications. Mediastinum/Nodes: No mass or enlarged lymph nodes within the mediastinum. Esophagus appears normal. Trachea appears normal. Lungs/Pleura: New dense consolidation within the RIGHT perihilar lung, bordering the fissure. Persistent prominent peribronchial thickening bilaterally. Increased pleural effusion and atelectasis at the RIGHT lung base, small to moderate in size. Milder atelectasis at the LEFT lung base. Additional bilateral perihilar edema. Upper Abdomen: No acute abnormality. Musculoskeletal: No acute or suspicious osseous finding. Degenerative spondylosis throughout the slightly scoliotic thoracolumbar  spine, mild to moderate in degree. Additional degenerative change at the bilateral shoulders, LEFT greater than RIGHT. IMPRESSION: 1. New dense consolidation within the RIGHT perihilar lung, consistent with pneumonia. 2. Increased pleural effusion and atelectasis at the RIGHT lung base, small to moderate in size. 3. Bilateral perihilar edema  indicating CHF/volume overload. Electronically Signed   By: Bary Richard M.D.   On: 03/06/2018 16:49        Scheduled Meds: . amoxicillin-clavulanate  1 tablet Oral Q12H  . chlorhexidine  15 mL Mouth Rinse BID  . [START ON 03/09/2018] furosemide  40 mg Oral Daily  . heparin  5,000 Units Subcutaneous Q8H  . insulin aspart  0-5 Units Subcutaneous QHS  . insulin aspart  0-9 Units Subcutaneous TID WC  . ipratropium-albuterol  3 mL Nebulization Q6H  . mouth rinse  15 mL Mouth Rinse q12n4p  . methylPREDNISolone (SOLU-MEDROL) injection  40 mg Intravenous Q12H  . scopolamine  1 patch Transdermal Q72H   Continuous Infusions:    LOS: 6 days    Time spent: 35 mins.More than 50% of that time was spent in counseling and/or coordination of care.      Burnadette Pop, MD [facility] Pager [phone]  If 7PM-7AM, please contact night-coverage [email or web address] Password [email or web address] 03/08/2018, 1:51 PM

## 2018-03-09 LAB — GLUCOSE, CAPILLARY
Glucose-Capillary: 218 mg/dL — ABNORMAL HIGH (ref 70–99)
Glucose-Capillary: 224 mg/dL — ABNORMAL HIGH (ref 70–99)
Glucose-Capillary: 245 mg/dL — ABNORMAL HIGH (ref 70–99)
Glucose-Capillary: 287 mg/dL — ABNORMAL HIGH (ref 70–99)

## 2018-03-09 LAB — URINALYSIS, ROUTINE W REFLEX MICROSCOPIC
BACTERIA UA: NONE SEEN
Bilirubin Urine: NEGATIVE
Glucose, UA: 150 mg/dL — AB
Ketones, ur: NEGATIVE mg/dL
Nitrite: NEGATIVE
Protein, ur: NEGATIVE mg/dL
Specific Gravity, Urine: 1.01 (ref 1.005–1.030)
WBC, UA: >50 WBC/hpf — ABNORMAL HIGH (ref 0–5)
pH: 5 (ref 5.0–8.0)

## 2018-03-09 MED ORDER — SODIUM CHLORIDE 0.9 % IV SOLN
1.0000 g | INTRAVENOUS | Status: DC
  Administered 2018-03-09: 1 g via INTRAVENOUS
  Filled 2018-03-09: qty 10

## 2018-03-09 MED ORDER — IPRATROPIUM-ALBUTEROL 0.5-2.5 (3) MG/3ML IN SOLN: 3.0000 mL | RESPIRATORY_TRACT | Status: DC | PRN

## 2018-03-09 MED ORDER — SODIUM CHLORIDE 0.9 % IV SOLN: 1.0000 g | Freq: Once | INTRAVENOUS | Status: DC

## 2018-03-09 NOTE — Progress Notes (Signed)
Pt with no UOP this shift. Bladder scan with 175 cc of urine obtained. Foley was Discontinued yesterday.   Of note pt has low PO intake. Provider notified

## 2018-03-09 NOTE — Progress Notes (Signed)
PROGRESS NOTE    Candice Woodward  ZOX:[id number] DOB: [birth year] DOA: 03/02/2018 PCP: Jiles Garter, FNP   Brief Narrative: Patient is 83 year old with past medical history of recurrent aspiration pneumonia, chronic respiratory failure, dementia on hospice, diabetes, was discharged from here 2 days ago and was brought back because of worsening dyspnea.  Patient was managed here for aspiration pneumonia and was discharged on comfort feeding followed by hospice.  On arrival, she was found to have severe lactic acidosis, acute respiratory failure and chest x-ray showed worsening bilateral multifocal pneumonia.  Currently she is being managed for acute respiratory failure secondary to multifocal pneumonia.  Palliative care has been consulted.  Her overall status is improved.  Plan for discharge today but she was noted to have very cloudy urine.  UA shows developed urine tract infection.  Urine culture sent.  Started on ceftriaxone.  Planning for discharge home with hospice tomorrow pending urine culture report.  Assessment & Plan:   Principal Problem:   Sepsis, Gram negative (HCC) Active Problems:   Alzheimer disease (HCC)   Hypothyroidism   Essential hypertension   Acute respiratory failure with hypoxia (HCC)   AKI (acute kidney injury) (HCC)   Hyponatremia   Acute metabolic encephalopathy   Hyperkalemia   Adult failure to thrive   Palliative care by specialist   Dysphagia  Severe sepsis:Presented with severe lactic acidosis.  Sepsis secondary to aspiration pneumonia. .  Blood cultures have been sent.So far negative.  Acute on chronic hypoxic/hypercarbic respiratory failure: Secondary to bilateral multifocal pneumonia..  Had to be put on  BiPAP because of acute respiratory distress. She is off bipap now.  Last CXR showed possible pulmonary edema. CT chest showed New dense consolidation within the RIGHT perihilar lung, consistent with pneumonia,Increased pleural effusion and atelectasis  at the RIGHT lung base, small to moderate in size,Bilateral perihilar edema indicating CHF/volume overload. She is on lasix daily.Also started on steroids because of persistent wheezing.Respiratory status has significantly improved.  Recurrent aspiration pneumonia/Dysphagia: Chest x-ray on admission showed bilateral multifocal pneumonia. Treated  on broad-spectrum antibiotics.On dysphagia 1,thin liquid.  She has been eating now.  UTI? :  Urine today was noted to be very cloudy and looked grossly infected.  UA suggestive of urinary tract infection showing leukocytes.  Started on ceftriaxone.  Will check urine culture.  Patient is hemodynamically stable, no fever.  Symptoms of dysuria could not  be assessed due to patient's mental status  Hypokalemia: Being supplemented.  Acute kidney injury:Resolved  Metabolic encephalopathy: Associated with pneumonia, metabolic encephalopathy.  Patient is nonverbal on baseline.  Dementia:Nonverbal and confused at baseline.  Being followed with hospice at home.  Diabetes: Continue sliding-scale insulin for now.  Constipation: Had a bowel movement on 03/04/18.   Advanced age/multiple comorbidities/poor prognosis: We have discussed extensively with the daughter at the bedside daily.Patient is DNR.  Daughter wants ongoing  treatment for pneumonia and says her goal is to take her mother home. She had denied  residential hospice or comfort care on my previous conversation.  Palliative care is also following.      DVT prophylaxis: SCD Code Status: DNR Family Communication: None present at the bed side Disposition Plan: Likely home with hospice tomorrow pending urine culture  Consultants: None  Procedures:None  Antimicrobials:Augmentin Subjective: Patient seen and examined at bedside this morning.  Looks  comfortable today.  Not in respiratory distress.  Eating.Off BiPAP.Urine findings as above  Objective: Vitals:   03/09/18 0823 03/09/18 0835 03/09/18  0900 03/09/18  1235  BP:  (!) 108/59  103/60  Pulse:  99 84 74  Resp:  (!) 36 (!) 24 (!) 24  Temp:      TempSrc:      SpO2: 100% 100% 100% 100%  Weight:      Height:        Intake/Output Summary (Last 24 hours) at 03/09/2018 1500 Last data filed at 03/09/2018 0900 Gross per 24 hour  Intake 0 ml  Output 1 ml  Net -1 ml   Filed Weights   03/02/18 2045 03/03/18 0828  Weight: 95.7 kg 100.4 kg    Examination:  General exam: Chronically ill looking, obese HEENT:PERRL,Oral mucosa moist, Ear/Nose normal on gross exam Respiratory system: Bilateral decreased air entry,Cardiovascular system: S1 & S2 heard, RRR. No JVD, murmurs, rubs, gallops or clicks. Pedal edema. Gastrointestinal system: Abdomen is distended, soft and nontender. No organomegaly or masses felt. Normal bowel sounds heard. Central nervous system: Not Alert and oriented.  Extremities: No edema, no clubbing ,no cyanosis, distal peripheral pulses palpable.   Data Reviewed: I have personally reviewed following labs and imaging studies  CBC: Recent Labs  Lab 03/02/18 1949 03/02/18 2003 03/03/18 1002 03/04/18 0640 03/06/18 0400 03/08/18 0328  WBC 15.2*  --  11.6* 7.0 8.2 8.7  NEUTROABS 9.8*  --  9.4* 5.0 5.9 7.5  HGB 12.7 13.9 10.7* 10.6* 10.1* 10.5*  HCT 41.8 41.0 34.1* 33.7* 32.8* 32.2*  MCV 89.5  --  86.8 87.5 88.2 87.0  PLT 330  --  203 180 208 201   Basic Metabolic Panel: Recent Labs  Lab 03/04/18 0640 03/04/18 2227 03/06/18 0400 03/07/18 0409 03/08/18 0328  NA 132* 133* 139 137 136  K 3.8 3.8 3.8 3.1* 3.9  CL 95* 96* 98 93* 95*  CO2 28 28 29  32 31  GLUCOSE 197* 163* 139* 144* 220*  BUN 6* <5* 7* 5* 7*  CREATININE 0.91 0.91 1.08* 1.08* 1.08*  CALCIUM 8.3* 8.1* 8.5* 8.6* 8.6*  MG  --   --   --   --  2.2   GFR: Estimated Creatinine Clearance: 48 mL/min (A) (by C-G formula based on SCr of 1.08 mg/dL (H)). Liver Function Tests: Recent Labs  Lab 03/02/18 1949 03/03/18 1002  AST 50* 18  ALT 19  14  ALKPHOS 56 42  BILITOT 1.7* 0.6  PROT 6.6 6.0*  ALBUMIN 2.8* 2.6*   No results for input(s): LIPASE, AMYLASE in the last 168 hours. No results for input(s): AMMONIA in the last 168 hours. Coagulation Profile: Recent Labs  Lab 03/03/18 1002  INR 1.10   Cardiac Enzymes: Recent Labs  Lab 03/02/18 1949  TROPONINI 0.04*   BNP (last 3 results) No results for input(s): PROBNP in the last 8760 hours. HbA1C: No results for input(s): HGBA1C in the last 72 hours. CBG: Recent Labs  Lab 03/08/18 1143 03/08/18 1647 03/08/18 2023 03/09/18 0804 03/09/18 1206  GLUCAP 209* 274* 234* 218* 224*   Lipid Profile: No results for input(s): CHOL, HDL, LDLCALC, TRIG, CHOLHDL, LDLDIRECT in the last 72 hours. Thyroid Function Tests: No results for input(s): TSH, T4TOTAL, FREET4, T3FREE, THYROIDAB in the last 72 hours. Anemia Panel: No results for input(s): VITAMINB12, FOLATE, FERRITIN, TIBC, IRON, RETICCTPCT in the last 72 hours. Sepsis Labs: Recent Labs  Lab 03/02/18 2004 03/02/18 2217 03/03/18 1002 03/03/18 1908 03/03/18 2146  PROCALCITON  --   --  1.87  --   --   LATICACIDVEN 5.15* 3.37*  --  1.2 0.9  Recent Results (from the past 240 hour(s))  Blood Culture (routine x 2)     Status: None   Collection Time: 03/02/18  8:22 PM  Result Value Ref Range Status   Specimen Description BLOOD LEFT FOREARM  Final   Special Requests   Final    BOTTLES DRAWN AEROBIC AND ANAEROBIC Blood Culture results may not be optimal due to an inadequate volume of blood received in culture bottles   Culture   Final    NO GROWTH 5 DAYS Performed at [facility], [address] [place], [place] [address]    Report Status 03/07/2018 FINAL  Final  Blood Culture (routine x 2)     Status: None   Collection Time: 03/02/18  8:22 PM  Result Value Ref Range Status   Specimen Description BLOOD LEFT HAND  Final   Special Requests   Final    BOTTLES DRAWN AEROBIC ONLY Blood Culture results may not  be optimal due to an excessive volume of blood received in culture bottles   Culture   Final    NO GROWTH 5 DAYS Performed at [facility], [address] [place], [place] [address]    Report Status 03/07/2018 FINAL  Final  MRSA PCR Screening     Status: None   Collection Time: 03/03/18  8:36 AM  Result Value Ref Range Status   MRSA by PCR NEGATIVE NEGATIVE Final    Comment:        The GeneXpert MRSA Assay (FDA approved for NASAL specimens only), is one component of a comprehensive MRSA colonization surveillance program. It is not intended to diagnose MRSA infection nor to guide or monitor treatment for MRSA infections. Performed at [facility], [address] [place], [place] [address]          Radiology Studies: No results found.      Scheduled Meds: . chlorhexidine  15 mL Mouth Rinse BID  . furosemide  40 mg Oral Daily  . heparin  5,000 Units Subcutaneous Q8H  . insulin aspart  0-5 Units Subcutaneous QHS  . insulin aspart  0-9 Units Subcutaneous TID WC  . ipratropium-albuterol  3 mL Nebulization Q6H  . mouth rinse  15 mL Mouth Rinse q12n4p  . methylPREDNISolone (SOLU-MEDROL) injection  40 mg Intravenous Q12H  . scopolamine  1 patch Transdermal Q72H   Continuous Infusions: . cefTRIAXone (ROCEPHIN)  IV       LOS: 7 days    Time spent: 35 mins.More than 50% of that time was spent in counseling and/or coordination of care.      Burnadette PopAmrit Nissi Doffing, MD [facility] Pager [phone]  If 7PM-7AM, please contact night-coverage [email or web address] Password [email or web address] 03/09/2018, 3:00 PM

## 2018-03-09 NOTE — Progress Notes (Signed)
[facility] Hospital Liaison: GIP RN Visit @0945   This is a related and covered GIP admission of12/1with [facility] diagnosis ofAlzheimer's diseaseper Dr. Jamie BrookesMonguilod. Patient has OOF DNR in home.[facility], was at home for approximately one hour, daughter was bathing her and noticed that the pt was turning blue and unresponsive. EMS was activated, reported SPO2 in the 30s, bipap was initiated and pt was transported to ED.  Pt admitted with acute respiratory failure. Note patient previously managed for aspiration pneumonia and was discharged home on comfort feeding.   Nutrition- Today pt tolerating small amounts of soft foods. Daughter at bedside feeding pt. Pt pocketing some foods in her mouth but eventually swallowing. No noted distress or coughing present. Bedside nurse Vernona Rieger(Laura) also indicates pt medications are tolerated crushed and administered with applesauce.  HIGH RISK OF ASPIRATION NOTED  GI/GU- Bedside nurse Vernona Rieger(Laura) indicates pt had a small BM today. Note RN (Ngeno) 3rd shift on 12/7 6:22AM indicated no UOP and low PO intake -bladder scan completed resulting in a residual 175cc that was obtained after foley was discontinued. Pt now has a Purerick catheter with yellow coloration with noted sediments.   MEDICATIONS- Pt remains with right PICC with KVO NS single lumen (CDI). IV antibiotics changed to oral with good tolerance as followed: Within the last 24 hrs pt started on PO Augmentin 875-125 mg 1 tablet Q12 hrs along with Lasix 40 gm everyday, Scopolamine 1 mg/3 days 1.5 mg started on 12/2 pend change on 12/5, Duonebs 3 ml q 6 hrs, SSI and methylprednisolone sodium succinate changed from 60 mg daily to 40 mg Q12 hr.   Per MD notes 03/08/2018 Severe sepsis:Presented with severe lactic acidosis.  Sepsis secondary to aspiration pneumonia. .  Blood cultures have been sent.So far negative.Changed antibiotics to oral.  Acute on chronic  hypoxic/hypercarbic respiratory failure: Secondary to bilateral multifocal pneumonia..  Had to be put on  BiPAP because of acute respiratory distress. She is off bipap now.  Last CXR showed possible pulmonary edema. CT chest showed New dense consolidation within the RIGHT perihilar lung, consistent with pneumonia,Increased pleural effusion and atelectasis at the RIGHT lung base, small to moderate in size,Bilateral perihilar edema indicating CHF/volume overload. She is on lasix daily.Also started on steroids because of persistent wheezing.Respiratory status has significantly improved.  Recurrent aspiration pneumonia/Dysphagia: Chest x-ray on admission showed bilateral multifocal pneumonia.  Started on broad-spectrum antibiotics.On dysphagia 1,thin liquid.  She has been eating now.  Dementia:Nonverbal and confused at baseline.  Being followed with hospice at home.  Communication- Spoke with [facility] daughter [name]([name]) at this visit. Received report bedside nurse Vernona Rieger(Laura) prior to visit.  Goals of Care- Pt to be discharged home with her live-in daughter with Hospice at home. Daughter desires for pt to have a home death. Pt will have home O2 currently on 2-3 liters with 97%. Disposition for possible home discharge today or tomorrow.  PPS- 20% total care bed-bound with minimal sips and tolerance for orals when feed. Pt with Dementia and unable to complete any activities or mobility (ROM) at this time independently. Pt able to have random facial expressions however unable to respond verbally (non-verbal at baseline).  SPO2-O2 & Vitals 12/7 108/59, 97% 2 liters, 86 HR, 22 RR Temp 12/6 98.1  Labs  03/03/2018 Albumin 2.6, PROT 6.0 03/08/2018 CL-95, Glucose 220, BUN 7, Creatinine 1.08, Calcium 8.6, HGB 10.5 and HCT 32.2  Please use [facility] for ambulance transport at time of  discharge.  Please call with any Hospice related questions or concerns.  Gerri Spore, BSN [facility]  Liaison [phone]  Hospice Liaison are on AMION.

## 2018-03-10 LAB — URINE CULTURE
Culture: 20000 — AB
Special Requests: NORMAL

## 2018-03-10 LAB — BASIC METABOLIC PANEL
Anion gap: 11 (ref 5–15)
BUN: 17 mg/dL (ref 8–23)
CO2: 32 mmol/L (ref 22–32)
Calcium: 9 mg/dL (ref 8.9–10.3)
Chloride: 92 mmol/L — ABNORMAL LOW (ref 98–111)
Creatinine, Ser: 1.13 mg/dL — ABNORMAL HIGH (ref 0.44–1.00)
GFR calc Af Amer: 53 mL/min — ABNORMAL LOW (ref >60)
GFR calc non Af Amer: 46 mL/min — ABNORMAL LOW (ref >60)
Glucose, Bld: 257 mg/dL — ABNORMAL HIGH (ref 70–99)
POTASSIUM: 3.9 mmol/L (ref 3.5–5.1)
Sodium: 135 mmol/L (ref 135–145)

## 2018-03-10 LAB — CBC WITH DIFFERENTIAL/PLATELET
Abs Immature Granulocytes: 0.05 10*3/uL (ref 0.00–0.07)
Basophils Absolute: 0 10*3/uL (ref 0.0–0.1)
Basophils Relative: 0 %
Eosinophils Absolute: 0 10*3/uL (ref 0.0–0.5)
Eosinophils Relative: 0 %
HCT: 32.5 % — ABNORMAL LOW (ref 36.0–46.0)
Hemoglobin: 10.4 g/dL — ABNORMAL LOW (ref 12.0–15.0)
Immature Granulocytes: 1 %
Lymphocytes Relative: 14 %
Lymphs Abs: 1.5 10*3/uL (ref 0.7–4.0)
MCH: 27.9 pg (ref 26.0–34.0)
MCHC: 32 g/dL (ref 30.0–36.0)
MCV: 87.1 fL (ref 80.0–100.0)
Monocytes Absolute: 0.7 10*3/uL (ref 0.1–1.0)
Monocytes Relative: 7 %
NEUTROS ABS: 8.2 10*3/uL — AB (ref 1.7–7.7)
Neutrophils Relative %: 78 %
Platelets: 238 10*3/uL (ref 150–400)
RBC: 3.73 MIL/uL — ABNORMAL LOW (ref 3.87–5.11)
RDW: 15.1 % (ref 11.5–15.5)
WBC: 10.4 10*3/uL (ref 4.0–10.5)
nRBC: 0 % (ref 0.0–0.2)

## 2018-03-10 LAB — GLUCOSE, CAPILLARY: Glucose-Capillary: 216 mg/dL — ABNORMAL HIGH (ref 70–99)

## 2018-03-10 MED ORDER — IPRATROPIUM-ALBUTEROL 0.5-2.5 (3) MG/3ML IN SOLN: 3.0000 mL | Freq: Two times a day (BID) | RESPIRATORY_TRACT | Status: DC

## 2018-03-10 MED ORDER — FUROSEMIDE 40 MG PO TABS: 40.0000 mg | ORAL_TABLET | Freq: Every day | ORAL | 0 refills | Status: AC

## 2018-03-10 MED ORDER — BISACODYL 5 MG PO TBEC: 10.0000 mg | DELAYED_RELEASE_TABLET | Freq: Every day | ORAL | 0 refills | Status: AC | PRN

## 2018-03-10 MED ORDER — PREDNISONE 5 MG PO TABS: 5.0000 mg | ORAL_TABLET | Freq: Every day | ORAL | Status: AC

## 2018-03-10 MED ORDER — AMOXICILLIN-POT CLAVULANATE 875-125 MG PO TABS: 1.0000 | ORAL_TABLET | Freq: Two times a day (BID) | ORAL | 0 refills | Status: AC

## 2018-03-10 MED ORDER — PREDNISONE 10 MG PO TABS: ORAL_TABLET | ORAL | 0 refills | Status: AC

## 2018-03-10 NOTE — Care Management Note (Signed)
Case Management Note  Patient Details  Name: Candice Woodward MRN: [id number] Date of Birth: 03/21/1935  Subjective/Objective:                    Action/Plan:  [facility] printed out and on shadow chart. As well as Gold DNR form.  [name] with [facility] notified of DC.  Spoke w bedside nurse she will call [facility] at [phone] opt 1 then opt 3 to arrange DC when she is ready. [name] left to fill meds and go home to receive patient. No other CM needs identified.   Expected Discharge Date:  03/10/18               Expected Discharge Plan:  Home w Hospice Care  In-House Referral:  NA  Discharge planning Services  CM Consult  Post Acute Care Choice:  Resumption of Svcs/PTA Provider Choice offered to:     DME Arranged:  N/A DME Agency:  NA  HH Arranged:  NA HH Agency:  NA  Status of Service:  Completed, signed off  If discussed at Long Length of Stay Meetings, dates discussed:    Additional Comments:  Lawerance SabalDebbie Lavarius Doughten, RN 03/10/2018, 11:10 AM

## 2018-03-10 NOTE — Discharge Summary (Signed)
Physician Discharge Summary  Candice Woodward RWE:[id number] DOB: [birth year] DOA: 03/02/2018  PCP: [name], FNP  Admit date: 03/02/2018 Discharge date: 03/10/2018  Admitted From: Home Disposition:  Home  Discharge Condition:Stable CODE STATUS: DNR Diet recommendation: Dysphagia 1  Brief/Interim Summary:  Patient is 83 year old with past medical history of recurrent aspiration pneumonia, chronic respiratory failure, dementia on hospice, diabetes, was discharged from here 2 days ago and was brought back because of worsening dyspnea.  Patient was managed here for aspiration pneumonia and was discharged on comfort feeding followed by hospice.  On arrival, she was found to have severe lactic acidosis, acute respiratory failure and chest x-ray showed worsening bilateral multifocal pneumonia. She was being managed for acute respiratory failure secondary to multifocal pneumonia.  Palliative care was  consulted.  Her overall status has significantly improved with antibiotics.  Planning for discharge home with hospice today.  Following problems were addressed during her hospitalization:  Severe sepsis:Presented with severe lactic acidosis.  Sepsis secondary to aspiration pneumonia. Blood cultures sent and negative.  Acute on chronic hypoxic/hypercarbic respiratory failure: Secondary to bilateral multifocal pneumonia..  Had to be put on  BiPAP because of acute respiratory distress. She is off bipap now.  Last CXR showed possible pulmonary edema. CT chest showed New dense consolidation within the RIGHT perihilar lung, consistent with pneumonia,Increased pleural effusion and atelectasis at the RIGHT lung base, small to moderate in size,Bilateral perihilar edema indicating CHF/volume overload. She is on lasix daily.Also started on steroids because of persistent wheezing.Respiratory status has significantly improved. She will be discharged on tapering dose of prednisone after that she can continue  prednisone 5 mg daily.  Recurrent aspiration pneumonia/Dysphagia: Chest x-ray on admission showed bilateral multifocal pneumonia. Treated  on broad-spectrum antibiotics.On dysphagia 1,thin liquid.  She has been eating now.Speech was following here.  UTI? :  Urine was noted to be very cloudy and looked grossly infected.  UA suggestive of urinary tract infection showing leukocytes.  Started on ceftriaxone.  Sent urine culture.  Patient is hemodynamically stable, no fever.  Symptoms of dysuria could not  be assessed due to patient's mental status.Urine is clear now.  Hypokalemia:Supplemented.  Acute kidney injury:Resolved  Metabolic encephalopathy: Associated with pneumonia, metabolic encephalopathy.  Patient is nonverbal on baseline.  Dementia:Nonverbal and confused at baseline.  Being followed with hospice at home.  Diabetes: Continue home regimen.  Constipation: Continue home regimen.  Advanced age/multiple comorbidities/poor prognosis: We have discussed extensively with the daughter at the bedside daily.Patient is DNR.  She was seen by palliative medicine.  She has hospice arranged at home.   Discharge Diagnoses:  Principal Problem:   Sepsis, Gram negative ([place]) Active Problems:   Alzheimer disease ([place])   Hypothyroidism   Essential hypertension   Acute respiratory failure with hypoxia ([facility])   AKI (acute kidney injury) ([place])   Hyponatremia   Acute metabolic encephalopathy   Hyperkalemia   Adult failure to thrive   Palliative care by specialist   Dysphagia    Discharge Instructions  Discharge Instructions    Diet general   Complete by:  As directed    Dysphagia 1 diet   Discharge instructions   Complete by:  As directed    1) Please take prescribed medications as instructed. 2)Please follow up with Hospice at home. 3) Continue Dysphagia 1 diet. Stay upright for at least an hour after food.Take small bite at a time. 4) Check CBC and BMP in a week.    Increase activity slowly  Complete by:  As directed      Allergies as of 03/10/2018      Reactions   Lisinopril Swelling      Medication List    STOP taking these medications   metroNIDAZOLE 500 MG tablet Commonly known as:  FLAGYL     TAKE these medications   albuterol (2.5 MG/3ML) 0.083% nebulizer solution Commonly known as:  PROVENTIL Take 6 mLs (5 mg total) by nebulization 2 (two) times daily as needed for wheezing or shortness of breath.   amoxicillin-clavulanate 875-125 MG tablet Commonly known as:  AUGMENTIN Take 1 tablet by mouth 2 (two) times daily for 2 days. Start taking on:  03/11/2018   bisacodyl 5 MG EC tablet Commonly known as:  DULCOLAX Take 2 tablets (10 mg total) by mouth daily as needed for moderate constipation.   blood glucose meter kit and supplies Dispense based on patient and insurance preference. Use up to four times daily as directed. (FOR ICD-9 250.00, 250.01).   budesonide 0.25 MG/2ML nebulizer solution Commonly known as:  PULMICORT Take 2 mLs (0.25 mg total) by nebulization 2 (two) times daily.   divalproex 125 MG capsule Commonly known as:  DEPAKOTE SPRINKLE Take 2 capsules (250 mg total) by mouth every 12 (twelve) hours.   furosemide 40 MG tablet Commonly known as:  LASIX Take 1 tablet (40 mg total) by mouth daily.   guaifenesin 100 MG/5ML syrup Commonly known as:  ROBITUSSIN Take 100-200 mg by mouth 3 (three) times daily as needed for cough.   insulin glargine 100 UNIT/ML injection Commonly known as:  LANTUS Inject 20 units of Lantus every evening at 10 pm.   insulin lispro 100 UNIT/ML KiwkPen Commonly known as:  HUMALOG Inject 0.03 mLs (3 Units total) into the skin 3 (three) times daily. What changed:    how much to take  when to take this   Insulin Pen Needle 31G X 5 MM Misc Use with insulin pens 4 times daily   ipratropium-albuterol 0.5-2.5 (3) MG/3ML Soln Commonly known as:  DUONEB Inhale 3 mLs into the lungs every  8 (eight) hours as needed. For cough/shortness of breath/congestion   levothyroxine 100 MCG tablet Commonly known as:  SYNTHROID, LEVOTHROID TAKE 1 TABLET BY MOUTH FOR THYROID SUPPLEMENT What changed:  See the new instructions.   nystatin cream Commonly known as:  MYCOSTATIN Apply 1 application topically 2 (two) times daily as needed for dry skin.   predniSONE 10 MG tablet Commonly known as:  DELTASONE Take 4 pills daily for 3 days then 3 pills daily for 3 days then 2 pills daily for 3 days then 1 pill daily for 3 days then continue 5 mg daily indefinately What changed:  You were already taking a medication with the same name, and this prescription was added. Make sure you understand how and when to take each.   predniSONE 5 MG tablet Commonly known as:  DELTASONE Take 1 tablet (5 mg total) by mouth daily. Please continue only after finishing the tapering dose of prednisone. What changed:  additional instructions   scopolamine 1 MG/3DAYS Commonly known as:  TRANSDERM-SCOP Place 1 patch (1.5 mg total) onto the skin every 3 (three) days.      Follow-up Information    Sheets, Angelique, FNP. Schedule an appointment as soon as possible for a visit in 1 week(s).   Specialty:  Family Medicine Contact information: [address] [place] [address] [phone]  Allergies  Allergen Reactions  . Lisinopril Swelling    Consultations:  Palliative care   Procedures/Studies: Dg Chest 1 View  Result Date: 03/04/2018 CLINICAL DATA:  Dyspnea EXAM: CHEST  1 VIEW COMPARISON:  03/02/2018 FINDINGS: Stable cardiomegaly. Nonaneurysmal thoracic aorta. Diffuse bilateral airspace opacities at each lung base and left mid to upper lung may reflect stigmata of prior pneumonia or CHF depending on clinical presentation. PICC line catheter tip terminates at the cavoatrial junction. IMPRESSION: Patchy airspace opacities with cardiomegaly query CHF or multifocal pneumonia.  Electronically Signed   By: Ashley Royalty M.D.   On: 03/04/2018 23:54   Ct Head Wo Contrast  Result Date: 02/27/2018 CLINICAL DATA:  Altered mental status EXAM: CT HEAD WITHOUT CONTRAST TECHNIQUE: Contiguous axial images were obtained from the base of the skull through the vertex without intravenous contrast. COMPARISON:  Head CT 08/06/2016 FINDINGS: Brain: There is no mass, hemorrhage or extra-axial collection. There is severe atrophy with associated ventriculomegaly, unchanged. Severe chronic white matter changes of ischemic microangiopathy. Vascular: No abnormal hyperdensity of the major intracranial arteries or dural venous sinuses. No intracranial atherosclerosis. Skull: The visualized skull base, calvarium and extracranial soft tissues are normal. Sinuses/Orbits: Left mastoid effusion. Paranasal sinuses are clear. The orbits are normal. IMPRESSION: 1. No acute intracranial abnormality. 2. Severe atrophy and chronic ischemic microangiopathy. Electronically Signed   By: Ulyses Jarred M.D.   On: 02/27/2018 01:26   Ct Chest Wo Contrast  Result Date: 03/06/2018 CLINICAL DATA:  Shortness of breath, pulmonary edema suspected severe lactic acidosis, acute respiratory failure. Chest x-ray showing worsening multifocal pneumonia. History of aspiration pneumonia. EXAM: CT CHEST WITHOUT CONTRAST TECHNIQUE: Multidetector CT imaging of the chest was performed following the standard protocol without IV contrast. COMPARISON:  Chest x-rays dated 03/05/2018 and 03/04/2018. Chest CT dated 01/09/2018. FINDINGS: Cardiovascular: Cardiomegaly. No pericardial effusion. No thoracic aortic aneurysm. Scattered coronary artery calcifications. Mediastinum/Nodes: No mass or enlarged lymph nodes within the mediastinum. Esophagus appears normal. Trachea appears normal. Lungs/Pleura: New dense consolidation within the RIGHT perihilar lung, bordering the fissure. Persistent prominent peribronchial thickening bilaterally. Increased  pleural effusion and atelectasis at the RIGHT lung base, small to moderate in size. Milder atelectasis at the LEFT lung base. Additional bilateral perihilar edema. Upper Abdomen: No acute abnormality. Musculoskeletal: No acute or suspicious osseous finding. Degenerative spondylosis throughout the slightly scoliotic thoracolumbar spine, mild to moderate in degree. Additional degenerative change at the bilateral shoulders, LEFT greater than RIGHT. IMPRESSION: 1. New dense consolidation within the RIGHT perihilar lung, consistent with pneumonia. 2. Increased pleural effusion and atelectasis at the RIGHT lung base, small to moderate in size. 3. Bilateral perihilar edema indicating CHF/volume overload. Electronically Signed   By: Franki Cabot M.D.   On: 03/06/2018 16:49   Dg Chest Port 1 View  Result Date: 03/05/2018 CLINICAL DATA:  PICC line placement. EXAM: PORTABLE CHEST 1 VIEW COMPARISON:  03/04/2018 FINDINGS: RIGHT-sided PICC line has been placed, tip overlying the level of the superior vena cava. Shallow inflation. Heart size is accentuated by portable technique. Patchy parenchymal opacities persist, slightly improved at the RIGHT lung base. There is no pneumothorax following line placement. IMPRESSION: 1. RIGHT-sided PICC line tip overlying the superior vena cava. 2. Slight interval improvement in aeration of the RIGHT lung base. Electronically Signed   By: Nolon Nations M.D.   On: 03/05/2018 11:52   Dg Chest Port 1 View  Result Date: 03/02/2018 CLINICAL DATA:  Shortness of breath. EXAM: PORTABLE CHEST 1 VIEW COMPARISON:  02/27/2018 FINDINGS:  Lungs are adequately inflated demonstrate persistent hazy airspace process over the right upper lobe with worsening patchy airspace process over the left mid to lower lung and right base. Possible small amount left pleural fluid. Cardiomediastinal silhouette and remainder of the exam is unchanged. IMPRESSION: Worsening bilateral multifocal airspace process likely  pneumonia. Possible small amount left pleural fluid. Electronically Signed   By: Marin Olp M.D.   On: 03/02/2018 20:21   Dg Chest Port 1 View  Result Date: 02/27/2018 CLINICAL DATA:  Shortness of breath EXAM: PORTABLE CHEST 1 VIEW COMPARISON:  Chest radiograph 01/08/2018 FINDINGS: There are reticular opacities in the right upper lobe, increased from the prior study. Lungs are otherwise clear. Mild cardiomegaly. No pleural effusion or pneumothorax. IMPRESSION: Increased reticular opacities in the right upper lobe may indicate developing infection. Electronically Signed   By: Ulyses Jarred M.D.   On: 02/27/2018 01:04   [place] Ekg Site Rite  Result Date: 03/04/2018 If Site Rite image not attached, placement could not be confirmed due to current cardiac rhythm.     Subjective: Patient seen and examined the bedside this morning.  Remains hemodynamically stable.  She looks comfortable during my evaluation.  Stable for discharge to home.  Extensive discussion about the discharge planning with daughter.  Discharge Exam: Vitals:   03/10/18 0759 03/10/18 0815  BP:    Pulse:    Resp:    Temp:  98.5 F (36.9 C)  SpO2: 100%    Vitals:   03/10/18 0442 03/10/18 0500 03/10/18 0759 03/10/18 0815  BP: 128/77     Pulse: 79     Resp: (!) 24     Temp:  98.2 F (36.8 C)  98.5 F (36.9 C)  TempSrc:  Axillary  Axillary  SpO2: 100%  100%   Weight:      Height:        General: Pt is alert, awake, not in acute distress Cardiovascular: RRR, S1/S2 +, no rubs, no gallops Respiratory: Decreased air entry bilaterally Abdominal: Soft, NT, ND, bowel sounds + Extremities: no edema, no cyanosis    The results of significant diagnostics from this hospitalization (including imaging, microbiology, ancillary and laboratory) are listed below for reference.     Microbiology: Recent Results (from the past 240 hour(s))  Blood Culture (routine x 2)     Status: None   Collection Time: 03/02/18  8:22 PM   Result Value Ref Range Status   Specimen Description BLOOD LEFT FOREARM  Final   Special Requests   Final    BOTTLES DRAWN AEROBIC AND ANAEROBIC Blood Culture results may not be optimal due to an inadequate volume of blood received in culture bottles   Culture   Final    NO GROWTH 5 DAYS Performed at [facility], [place] [address], [place], [place] [address]    Report Status 03/07/2018 FINAL  Final  Blood Culture (routine x 2)     Status: None   Collection Time: 03/02/18  8:22 PM  Result Value Ref Range Status   Specimen Description BLOOD LEFT HAND  Final   Special Requests   Final    BOTTLES DRAWN AEROBIC ONLY Blood Culture results may not be optimal due to an excessive volume of blood received in culture bottles   Culture   Final    NO GROWTH 5 DAYS Performed at [facility], [place] [address], [place], [place] [address]    Report Status 03/07/2018 FINAL  Final  MRSA PCR Screening  Status: None   Collection Time: 03/03/18  8:36 AM  Result Value Ref Range Status   MRSA by PCR NEGATIVE NEGATIVE Final    Comment:        The GeneXpert MRSA Assay (FDA approved for NASAL specimens only), is one component of a comprehensive MRSA colonization surveillance program. It is not intended to diagnose MRSA infection nor to guide or monitor treatment for MRSA infections. Performed at [facility], [place] [address], [place], [place] [address]      Labs: BNP (last 3 results) Recent Labs    01/08/18 2209 02/28/18 0443 03/02/18 1949  BNP 219.2* 207.0* 962.9*   Basic Metabolic Panel: Recent Labs  Lab 03/04/18 2227 03/06/18 0400 03/07/18 0409 03/08/18 0328 03/10/18 0319  NA 133* 139 137 136 135  K 3.8 3.8 3.1* 3.9 3.9  CL 96* 98 93* 95* 92*  CO2 28 29 32 31 32  GLUCOSE 163* 139* 144* 220* 257*  BUN <5* 7* 5* 7* 17  CREATININE 0.91 1.08* 1.08* 1.08* 1.13*  CALCIUM 8.1* 8.5* 8.6* 8.6* 9.0  MG  --   --   --  2.2  --    Liver Function Tests: No results for  input(s): AST, ALT, ALKPHOS, BILITOT, PROT, ALBUMIN in the last 168 hours. No results for input(s): LIPASE, AMYLASE in the last 168 hours. No results for input(s): AMMONIA in the last 168 hours. CBC: Recent Labs  Lab 03/04/18 0640 03/06/18 0400 03/08/18 0328 03/10/18 0319  WBC 7.0 8.2 8.7 10.4  NEUTROABS 5.0 5.9 7.5 8.2*  HGB 10.6* 10.1* 10.5* 10.4*  HCT 33.7* 32.8* 32.2* 32.5*  MCV 87.5 88.2 87.0 87.1  PLT 180 208 201 238   Cardiac Enzymes: No results for input(s): CKTOTAL, CKMB, CKMBINDEX, TROPONINI in the last 168 hours. BNP: Invalid input(s): POCBNP CBG: Recent Labs  Lab 03/09/18 0804 03/09/18 1206 03/09/18 1651 03/09/18 2140 03/10/18 0813  GLUCAP 218* 224* 245* 287* 216*   D-Dimer No results for input(s): DDIMER in the last 72 hours. Hgb A1c No results for input(s): HGBA1C in the last 72 hours. Lipid Profile No results for input(s): CHOL, HDL, LDLCALC, TRIG, CHOLHDL, LDLDIRECT in the last 72 hours. Thyroid function studies No results for input(s): TSH, T4TOTAL, T3FREE, THYROIDAB in the last 72 hours.  Invalid input(s): FREET3 Anemia work up No results for input(s): VITAMINB12, FOLATE, FERRITIN, TIBC, IRON, RETICCTPCT in the last 72 hours. Urinalysis    Component Value Date/Time   COLORURINE STRAW (A) 03/09/2018 1213   APPEARANCEUR HAZY (A) 03/09/2018 1213   LABSPEC 1.010 03/09/2018 1213   PHURINE 5.0 03/09/2018 1213   GLUCOSEU 150 (A) 03/09/2018 1213   HGBUR MODERATE (A) 03/09/2018 1213   BILIRUBINUR NEGATIVE 03/09/2018 1213   KETONESUR NEGATIVE 03/09/2018 1213   PROTEINUR NEGATIVE 03/09/2018 1213   NITRITE NEGATIVE 03/09/2018 1213   LEUKOCYTESUR MODERATE (A) 03/09/2018 1213   Sepsis Labs Invalid input(s): PROCALCITONIN,  WBC,  LACTICIDVEN Microbiology Recent Results (from the past 240 hour(s))  Blood Culture (routine x 2)     Status: None   Collection Time: 03/02/18  8:22 PM  Result Value Ref Range Status   Specimen Description BLOOD LEFT FOREARM   Final   Special Requests   Final    BOTTLES DRAWN AEROBIC AND ANAEROBIC Blood Culture results may not be optimal due to an inadequate volume of blood received in culture bottles   Culture   Final    NO GROWTH 5 DAYS Performed at [facility],  [address], [place], [place] [address]    Report Status 03/07/2018 FINAL  Final  Blood Culture (routine x 2)     Status: None   Collection Time: 03/02/18  8:22 PM  Result Value Ref Range Status   Specimen Description BLOOD LEFT HAND  Final   Special Requests   Final    BOTTLES DRAWN AEROBIC ONLY Blood Culture results may not be optimal due to an excessive volume of blood received in culture bottles   Culture   Final    NO GROWTH 5 DAYS Performed at [facility], [place] [address], [place], [place] [address]    Report Status 03/07/2018 FINAL  Final  MRSA PCR Screening     Status: None   Collection Time: 03/03/18  8:36 AM  Result Value Ref Range Status   MRSA by PCR NEGATIVE NEGATIVE Final    Comment:        The GeneXpert MRSA Assay (FDA approved for NASAL specimens only), is one component of a comprehensive MRSA colonization surveillance program. It is not intended to diagnose MRSA infection nor to guide or monitor treatment for MRSA infections. Performed at [facility], [place] [address], [place], [place] [address]     Please note: You were cared for by a hospitalist during your hospital stay. Once you are discharged, your primary care physician will handle any further medical issues. Please note that NO REFILLS for any discharge medications will be authorized once you are discharged, as it is imperative that you return to your primary care physician (or establish a relationship with a primary care physician if you do not have one) for your post hospital discharge needs so that they can reassess your need for medications and monitor your lab values.    Time coordinating discharge: 40 minutes  SIGNED:   Shelly Coss, MD  [facility] 03/10/2018, 10:19 AM Pager [phone]  If 7PM-7AM, please contact night-coverage [email or web address] Password [email or web address]

## 2018-03-10 NOTE — Progress Notes (Signed)
Patient discharged to home with hospice. EMS transporting patient, daughter has been notified of departure, she is at home waiting for their arrival. All paperwork has been sent with EMS team. Hospice RN has been called and notified of discharge and she will set up home visit.

## 2018-04-01 DIAGNOSIS — Z66 Do not resuscitate: Secondary | ICD-10-CM

## 2019-08-12 ENCOUNTER — Telehealth: Payer: Self-pay | Admitting: Adult Health

## 2019-08-12 ENCOUNTER — Telehealth: Payer: Self-pay | Admitting: Physician Assistant

## 2019-08-12 NOTE — Telephone Encounter (Signed)
Called to discuss the homebound Covid-19 vaccination initiative with the patient and/or caregiver.   Message left to call back.  Kindsey Eblin PA-C  [facility]     

## 2019-08-12 NOTE — Telephone Encounter (Signed)
I connected by phone with Candice Woodward and/or patient's caregiver on 08/12/2019 at 8:00 PM to discuss the potential vaccination through our Homebound vaccination initiative.   Prevaccination Checklist for COVID-19 Vaccines  1.  Are you feeling sick today? no  2.  Have you ever received a dose of a COVID-19 vaccine?  no      If yes, which one? None   3.  Have you ever had an allergic reaction: (This would include a severe reaction [ e.g., anaphylaxis] that required treatment with epinephrine or EpiPen or that caused you to go to the hospital.  It would also include an allergic reaction that occurred within 4 hours that caused hives, swelling, or respiratory distress, including wheezing.) A.  A previous dose of COVID-19 vaccine. no  B.  A vaccine or injectable therapy that contains multiple components, one of which is a COVID-19 vaccine component, but it is not known which component elicited the immediate reaction. no  C.  Are you allergic to polyethylene glycol? no   4.  Have you ever had an allergic reaction to another vaccine (other than COVID-19 vaccine) or an injectable medication? (This would include a severe reaction [ e.g., anaphylaxis] that required treatment with epinephrine or EpiPen or that caused you to go to the hospital.  It would also include an allergic reaction that occurred within 4 hours that caused hives, swelling, or respiratory distress, including wheezing.)  no   5.  Have you ever had a severe allergic reaction (e.g., anaphylaxis) to something other than a component of the COVID-19 vaccine, or any vaccine or injectable medication?  This would include food, pet, venom, environmental, or oral medication allergies.  no   6.  Have you received any vaccine in the last 14 days? no   7.  Have you ever had a positive test for COVID-19 or has a doctor ever told you that you had COVID-19?  no   8.  Have you received passive antibody therapy (monoclonal antibodies or convalescent  serum) as a treatment for COVID-19? no   9.  Do you have a weakened immune system caused by something such as HIV infection or cancer or do you take immunosuppressive drugs or therapies?  no   10.  Do you have a bleeding disorder or are you taking a blood thinner? no   11.  Are you pregnant or breast-feeding? no   12.  Do you have dermal fillers? no   __________________   This patient is a 85 y.o. female that meets the FDA criteria to receive homebound vaccination. Patient or parent/caregiver understands they have the option to accept or refuse homebound vaccination.  Patient passed the pre-screening checklist and would like to proceed with homebound vaccination.  Based on questionnaire above, I recommend the patient be observed for 30 min .   There are an estimated #0 of other household members/caregivers who are also interested in receiving the vaccine.   Spoke with daughter who is HPOA . Patient is nonverbal, would observe for as unable to communicate any allergic reactions symptoms .    I will send the patient's information to our scheduling team who will reach out to schedule the patient and potential caregiver/family members for homebound vaccination.    Candice Woodward 08/12/2019 8:00 PM

## 2019-09-03 ENCOUNTER — Ambulatory Visit: Payer: Medicare PPO | Attending: Critical Care Medicine

## 2019-09-03 DIAGNOSIS — Z23 Encounter for immunization: Secondary | ICD-10-CM

## 2019-09-03 NOTE — Progress Notes (Signed)
   Covid-19 Vaccination Clinic  Name:  Candice Woodward    MRN: [id number] DOB: [birth year]  09/03/2019  [name] was observed post Covid-19 immunization for 15 minutes without incident. She was provided with Vaccine Information Sheet and instruction to access the V-Safe system.   [name] was instructed to call 911 with any severe reactions post vaccine: Marland Kitchen Difficulty breathing  . Swelling of face and throat  . A fast heartbeat  . A bad rash all over body  . Dizziness and weakness   Immunizations Administered    Name Date Dose VIS Date Route   Moderna COVID-19 Vaccine 09/03/2019 10:40 AM 0.5 mL 03/2019 Intramuscular   Manufacturer: Moderna   Lot: [id number]   NDC: 04753-391-79

## 2019-10-01 ENCOUNTER — Ambulatory Visit: Payer: Medicare PPO | Attending: Internal Medicine

## 2019-10-01 DIAGNOSIS — Z23 Encounter for immunization: Secondary | ICD-10-CM

## 2019-10-01 NOTE — Progress Notes (Signed)
   Covid-19 Vaccination Clinic  Name:  Candice Woodward    MRN: [id number] DOB: Jul 24, 1934  10/01/2019  Ms. Nielson was observed post Covid-19 immunization for 15 minutes without incident. She was provided with Vaccine Information Sheet and instruction to access the V-Safe system.   Ms. Topor was instructed to call 911 with any severe reactions post vaccine: Marland Kitchen Difficulty breathing  . Swelling of face and throat  . A fast heartbeat  . A bad rash all over body  . Dizziness and weakness   Immunizations Administered    Name Date Dose VIS Date Route   Moderna COVID-19 Vaccine 10/01/2019  9:57 AM 0.5 mL 03/2019 Intramuscular   Manufacturer: Moderna   Lot: [id number]   NDC: 80165-537-48

## 2019-11-16 ENCOUNTER — Emergency Department (HOSPITAL_BASED_OUTPATIENT_CLINIC_OR_DEPARTMENT_OTHER): Payer: Medicare PPO

## 2019-11-16 ENCOUNTER — Emergency Department (HOSPITAL_COMMUNITY): Payer: Medicare PPO

## 2019-11-16 ENCOUNTER — Emergency Department (HOSPITAL_COMMUNITY)
Admission: EM | Admit: 2019-11-16 | Discharge: 2019-11-16 | Disposition: A | Discharge status: Home / Self Care | Payer: Medicare PPO | Attending: Emergency Medicine | Admitting: Emergency Medicine

## 2019-11-16 ENCOUNTER — Encounter (HOSPITAL_COMMUNITY): Payer: Self-pay | Admitting: Emergency Medicine

## 2019-11-16 DIAGNOSIS — R062 Wheezing: Secondary | ICD-10-CM | POA: Diagnosis not present

## 2019-11-16 DIAGNOSIS — Y999 Unspecified external cause status: Secondary | ICD-10-CM | POA: Insufficient documentation

## 2019-11-16 DIAGNOSIS — E039 Hypothyroidism, unspecified: Secondary | ICD-10-CM | POA: Insufficient documentation

## 2019-11-16 DIAGNOSIS — E871 Hypo-osmolality and hyponatremia: Secondary | ICD-10-CM | POA: Diagnosis not present

## 2019-11-16 DIAGNOSIS — Y939 Activity, unspecified: Secondary | ICD-10-CM | POA: Insufficient documentation

## 2019-11-16 DIAGNOSIS — L0390 Cellulitis, unspecified: Secondary | ICD-10-CM

## 2019-11-16 DIAGNOSIS — I10 Essential (primary) hypertension: Secondary | ICD-10-CM | POA: Diagnosis not present

## 2019-11-16 DIAGNOSIS — I96 Gangrene, not elsewhere classified: Secondary | ICD-10-CM

## 2019-11-16 DIAGNOSIS — S91109A Unspecified open wound of unspecified toe(s) without damage to nail, initial encounter: Secondary | ICD-10-CM

## 2019-11-16 DIAGNOSIS — S91302A Unspecified open wound, left foot, initial encounter: Secondary | ICD-10-CM | POA: Insufficient documentation

## 2019-11-16 DIAGNOSIS — Z794 Long term (current) use of insulin: Secondary | ICD-10-CM | POA: Diagnosis not present

## 2019-11-16 DIAGNOSIS — Z7401 Bed confinement status: Secondary | ICD-10-CM | POA: Diagnosis not present

## 2019-11-16 DIAGNOSIS — Y929 Unspecified place or not applicable: Secondary | ICD-10-CM | POA: Diagnosis not present

## 2019-11-16 DIAGNOSIS — I739 Peripheral vascular disease, unspecified: Secondary | ICD-10-CM | POA: Insufficient documentation

## 2019-11-16 DIAGNOSIS — X58XXXA Exposure to other specified factors, initial encounter: Secondary | ICD-10-CM | POA: Insufficient documentation

## 2019-11-16 DIAGNOSIS — R404 Transient alteration of awareness: Secondary | ICD-10-CM | POA: Diagnosis not present

## 2019-11-16 DIAGNOSIS — Z79899 Other long term (current) drug therapy: Secondary | ICD-10-CM | POA: Diagnosis not present

## 2019-11-16 DIAGNOSIS — S91105A Unspecified open wound of left lesser toe(s) without damage to nail, initial encounter: Secondary | ICD-10-CM | POA: Diagnosis not present

## 2019-11-16 DIAGNOSIS — E119 Type 2 diabetes mellitus without complications: Secondary | ICD-10-CM | POA: Diagnosis not present

## 2019-11-16 DIAGNOSIS — Z743 Need for continuous supervision: Secondary | ICD-10-CM | POA: Diagnosis not present

## 2019-11-16 DIAGNOSIS — F19939 Other psychoactive substance use, unspecified with withdrawal, unspecified: Secondary | ICD-10-CM | POA: Diagnosis not present

## 2019-11-16 DIAGNOSIS — G459 Transient cerebral ischemic attack, unspecified: Secondary | ICD-10-CM | POA: Diagnosis not present

## 2019-11-16 DIAGNOSIS — R4182 Altered mental status, unspecified: Secondary | ICD-10-CM | POA: Diagnosis not present

## 2019-11-16 DIAGNOSIS — R6889 Other general symptoms and signs: Secondary | ICD-10-CM | POA: Diagnosis not present

## 2019-11-16 DIAGNOSIS — M2550 Pain in unspecified joint: Secondary | ICD-10-CM | POA: Diagnosis not present

## 2019-11-16 DIAGNOSIS — R52 Pain, unspecified: Secondary | ICD-10-CM | POA: Diagnosis not present

## 2019-11-16 LAB — CBC WITH DIFFERENTIAL/PLATELET
Abs Immature Granulocytes: 0.04 10*3/uL (ref 0.00–0.07)
Basophils Absolute: 0.1 10*3/uL (ref 0.0–0.1)
Basophils Relative: 1 %
Eosinophils Absolute: 0.1 10*3/uL (ref 0.0–0.5)
Eosinophils Relative: 1 %
HCT: 41.2 % (ref 36.0–46.0)
Hemoglobin: 13.5 g/dL (ref 12.0–15.0)
Immature Granulocytes: 1 %
Lymphocytes Relative: 13 %
Lymphs Abs: 1.2 10*3/uL (ref 0.7–4.0)
MCH: 28.7 pg (ref 26.0–34.0)
MCHC: 32.8 g/dL (ref 30.0–36.0)
MCV: 87.5 fL (ref 80.0–100.0)
Monocytes Absolute: 0.6 10*3/uL (ref 0.1–1.0)
Monocytes Relative: 7 %
Neutro Abs: 6.8 10*3/uL (ref 1.7–7.7)
Neutrophils Relative %: 77 %
Platelets: 275 10*3/uL (ref 150–400)
RBC: 4.71 MIL/uL (ref 3.87–5.11)
RDW: 14.5 % (ref 11.5–15.5)
WBC: 8.7 10*3/uL (ref 4.0–10.5)
nRBC: 0 % (ref 0.0–0.2)

## 2019-11-16 LAB — COMPREHENSIVE METABOLIC PANEL
ALT: 17 U/L (ref 0–44)
AST: 23 U/L (ref 15–41)
Albumin: 3.4 g/dL — ABNORMAL LOW (ref 3.5–5.0)
Alkaline Phosphatase: 64 U/L (ref 38–126)
Anion gap: 10 (ref 5–15)
BUN: 13 mg/dL (ref 8–23)
CO2: 27 mmol/L (ref 22–32)
Calcium: 9.2 mg/dL (ref 8.9–10.3)
Chloride: 91 mmol/L — ABNORMAL LOW (ref 98–111)
Creatinine, Ser: 1.06 mg/dL — ABNORMAL HIGH (ref 0.44–1.00)
GFR calc Af Amer: 57 mL/min — ABNORMAL LOW (ref >60)
GFR calc non Af Amer: 49 mL/min — ABNORMAL LOW (ref >60)
Glucose, Bld: 144 mg/dL — ABNORMAL HIGH (ref 70–99)
Potassium: 4.3 mmol/L (ref 3.5–5.1)
Sodium: 128 mmol/L — ABNORMAL LOW (ref 135–145)
Total Bilirubin: 0.6 mg/dL (ref 0.3–1.2)
Total Protein: 7.1 g/dL (ref 6.5–8.1)

## 2019-11-16 LAB — LACTIC ACID, PLASMA: Lactic Acid, Venous: 2 mmol/L (ref 0.5–1.9)

## 2019-11-16 MED ORDER — HYDROCODONE-ACETAMINOPHEN 5-325 MG PO TABS: 1.0000 | ORAL_TABLET | Freq: Four times a day (QID) | ORAL | 0 refills | Status: AC | PRN

## 2019-11-16 MED ORDER — SODIUM CHLORIDE 0.9 % IV SOLN
1.0000 g | Freq: Once | INTRAVENOUS | Status: AC
  Administered 2019-11-16: 1 g via INTRAVENOUS
  Filled 2019-11-16: qty 10

## 2019-11-16 MED ORDER — ALBUTEROL SULFATE HFA 108 (90 BASE) MCG/ACT IN AERS
2.0000 | INHALATION_SPRAY | Freq: Four times a day (QID) | RESPIRATORY_TRACT | Status: DC | PRN
  Filled 2019-11-16: qty 6.7

## 2019-11-16 MED ORDER — IPRATROPIUM-ALBUTEROL 20-100 MCG/ACT IN AERS
1.0000 | INHALATION_SPRAY | Freq: Four times a day (QID) | RESPIRATORY_TRACT | Status: DC
  Filled 2019-11-16: qty 4

## 2019-11-16 MED ORDER — SODIUM CHLORIDE 0.9 % IV BOLUS (SEPSIS)
500.0000 mL | Freq: Once | INTRAVENOUS | Status: AC
  Administered 2019-11-16: 500 mL via INTRAVENOUS

## 2019-11-16 MED ORDER — SODIUM CHLORIDE 0.9 % IV SOLN: 1000.0000 mL | INTRAVENOUS | Status: DC

## 2019-11-16 NOTE — ED Notes (Addendum)
Charge was notified of pts increase in pain

## 2019-11-16 NOTE — ED Notes (Signed)
Patient transported to X-ray 

## 2019-11-16 NOTE — Discharge Instructions (Signed)
Continue wound care as we discussed, call Dr Evelina Dun office to schedule an appointment

## 2019-11-16 NOTE — ED Notes (Signed)
Ultrasound at bedside

## 2019-11-16 NOTE — Progress Notes (Addendum)
[facility])   [name] is our current hospice patient.  Her daughter Candice Woodward is her [facility] and caregiver. Candice Woodward will decide treatment plan for her mother and is not likely to deviate from what Candice Woodward feels is best for her mother.    [name] is total care at home and Candice Woodward provides all of her needs.  This is a complex psychosocial family dynamic at home.    [facility] will continue to follow and assist with [name] discussions for [name].  Please be mindful that Candice Woodward is very particular with the care her mother receives.  Venia Carbon RN, BSN, [facility] Liaison (direct # in [place] under hospice)  **Met with Candice Woodward and the patient at the bedside.  Candice Woodward reports that over the last few weeks they have been struggling to get wounds to heal on her left foot.  She advised that her mother has been showing increased s/s of pain which led to her bringing her mother to the ED. Candice Woodward advised she only tried tylenol for pain.  Plan is currently to see what [facility] recommends as far as management is concerned.  Labs and diagnostics benign so far. [facility] will support Candice Woodward and [name] with whatever decisions they make. Candice Woodward speaks of stopping hospice services to pursue aggressive treatment for suspected vascular disease.  Advised her to not stop hospice until we have all of the options for treatment given to [place].  Candice Woodward is aware that any aggressive treatment is generally out of the scope of what hospice will provide.   **Should pt d/c home, please utilize [facility] for transport as they provide this service for our active hospice pts.

## 2019-11-16 NOTE — ED Triage Notes (Signed)
BIB EMS from home. Daughter - primary caregiver - concerned wound to L foot is not healing. Has already received several rounds of antibiotics for infection. Nonverbal/contracted at baseline.

## 2019-11-16 NOTE — Progress Notes (Signed)
VASCULAR LAB     ABIs and left lower extremity arterial duplex completed.    Preliminary report:  See CV proc for preliminary results.   Called Dr. Lynelle Doctor with results.    Yuriana Gaal, RVT 11/16/2019, 3:01 PM

## 2019-11-16 NOTE — ED Provider Notes (Signed)
[facility] Provider Note   CSN: [id number] Arrival date & time: 11/16/19  0102     History Chief Complaint  Patient presents with  . Wound Infection    Candice Woodward is a 85 y.o. female.  HPI   Patient presents ED for evaluation of worsening wounds on her left foot.  Patient has a history of dementia.  Patient at baseline is bedridden.  She is cared 24/7 by her daughter.  Patient also has hospice following the patient.  Daughter presents to the ED with concerns of worsening wounds on her foot.  [name] is very careful to make sure she wears padded boots.  She watches her wounds very closely.  The daughter has noticed some wounds that have now opened up with the skin breaking down on her heel as well as her toes on her left foot.   The daughter has been paying close attention and providing all the treatment as previously recommended by the doctors.  She does not feel like it is getting any better.  The daughter was also concerned because last night she seemed to be having pain from this wound.  Usually she does not have any pain.  The daughter has also mentioned she herself has become more concerned and stressed because of her mother's poor health.      Past Medical History:  Diagnosis Date  . Acute bronchitis   . Alzheimer disease ([place])   . Alzheimer's disease ([place])   . Anxiety state, unspecified   . Dementia in conditions classified elsewhere with behavioral disturbance   . Disorder of bone and cartilage, unspecified   . Dizziness and giddiness   . Hypertension   . IBS (irritable bowel syndrome)   . Irritable bowel syndrome   . Lumbago   . Malnutrition of mild degree ([place])   . Obsessive-compulsive disorders   . Other and unspecified hyperlipidemia   . Personal history of urinary (tract) infection   . Rickets, active   . Senile dementia, uncomplicated ([place])   . Thyroid disease     Patient Active Problem List   Diagnosis  Date Noted  . DNR (do not resuscitate)   . Dysphagia   . Adult failure to thrive   . Palliative care by specialist   . Sepsis, Gram negative ([place]) 03/02/2018  . Hyperkalemia 03/02/2018  . Lactic acidosis   . Acute metabolic encephalopathy   . Aspiration pneumonia ([place]) 01/09/2018  . Acute respiratory failure with hypoxia ([place]) 01/09/2018  . AKI (acute kidney injury) ([place]) 01/09/2018  . Hyponatremia 01/09/2018  . New onset type 2 diabetes mellitus ([place]) 08/15/2016  . Decreased oral intake   . Goals of care, counseling/discussion   . Palliative care encounter   . Seizure ([place]) 08/06/2016  . Pressure ulcer of both heels, unstageable ([place]) 07/01/2015  . Urinary and fecal incontinence 01/04/2015  . Essential hypertension 01/04/2015  . Gait instability 01/04/2015  . Poor vision 01/04/2015  . Depression 01/04/2015  . Obsessive compulsive disorder 01/04/2015  . Hypothyroidism 08/08/2013  . Alzheimer disease ([place])   . Irritable bowel syndrome   . Malnutrition of mild degree ([place])   . Thyroid disease   . HTN (hypertension)     Past Surgical History:  Procedure Laterality Date  . PARTIAL HYSTERECTOMY    . REPLACEMENT TOTAL KNEE     bilateral  . THYROID SURGERY       OB History   No obstetric history on file.  Family History  Problem Relation Age of Onset  . Aneurysm Mother        abdominal aortic  . Cancer Father        bone  . Cancer Sister        colon  . Diabetes Sister     Social History   Tobacco Use  . Smoking status: Never Smoker  . Smokeless tobacco: Current User    Types: Snuff  Vaping Use  . Vaping Use: Unknown  Substance Use Topics  . Alcohol use: No    Alcohol/week: 0.0 standard drinks  . Drug use: No    Home Medications Prior to Admission medications   Medication Sig Start Date End Date Taking? Authorizing Provider  albuterol (PROVENTIL) (2.5 MG/3ML) 0.083% nebulizer solution Take 6 mLs (5 mg total) by nebulization 2 (two) times daily as  needed for wheezing or shortness of breath. 01/10/18   Shon Hale, MD  blood glucose meter kit and supplies Dispense based on patient and insurance preference. Use up to four times daily as directed. (FOR ICD-9 250.00, 250.01). 08/15/16   Rama, Maryruth Bun, MD  budesonide (PULMICORT) 0.25 MG/2ML nebulizer solution Take 2 mLs (0.25 mg total) by nebulization 2 (two) times daily. 03/02/18   Leatha Gilding, MD  divalproex (DEPAKOTE SPRINKLE) 125 MG capsule Take 2 capsules (250 mg total) by mouth every 12 (twelve) hours. 01/10/18   Shon Hale, MD  furosemide (LASIX) 40 MG tablet Take 1 tablet (40 mg total) by mouth daily. 03/10/18   Burnadette Pop, MD  guaifenesin (ROBITUSSIN) 100 MG/5ML syrup Take 100-200 mg by mouth 3 (three) times daily as needed for cough.    [provider]  HYDROcodone-acetaminophen (NORCO/VICODIN) 5-325 MG tablet Take 1 tablet by mouth every 6 (six) hours as needed. 11/16/19   Linwood Dibbles, MD  insulin glargine (LANTUS) 100 UNIT/ML injection Inject 20 units of Lantus every evening at 10 pm. 08/15/16 02/27/18  Rama, Maryruth Bun, MD  insulin lispro (HUMALOG KWIKPEN) 100 UNIT/ML KiwkPen Inject 0.03 mLs (3 Units total) into the skin 3 (three) times daily. Patient taking differently: Inject 0-10 Units into the skin 3 (three) times daily after meals.  08/10/16 03/02/26  Noralee Stain, DO  Insulin Pen Needle 31G X 5 MM MISC Use with insulin pens 4 times daily 08/10/16   Noralee Stain, DO  ipratropium-albuterol (DUONEB) 0.5-2.5 (3) MG/3ML SOLN Inhale 3 mLs into the lungs every 8 (eight) hours as needed. For cough/shortness of breath/congestion 03/02/18   Leatha Gilding, MD  levothyroxine (SYNTHROID, LEVOTHROID) 100 MCG tablet TAKE 1 TABLET BY MOUTH FOR THYROID SUPPLEMENT Patient taking differently: Take 100 mcg by mouth daily before breakfast.  06/01/15   Reed, Tiffany L, DO  nystatin cream (MYCOSTATIN) Apply 1 application topically 2 (two) times daily as needed for dry  skin.    [provider]  predniSONE (DELTASONE) 10 MG tablet Take 4 pills daily for 3 days then 3 pills daily for 3 days then 2 pills daily for 3 days then 1 pill daily for 3 days then continue 5 mg daily indefinately 03/10/18   Burnadette Pop, MD  predniSONE (DELTASONE) 5 MG tablet Take 1 tablet (5 mg total) by mouth daily. Please continue only after finishing the tapering dose of prednisone. 03/10/18   Burnadette Pop, MD  scopolamine (TRANSDERM-SCOP) 1 MG/3DAYS Place 1 patch (1.5 mg total) onto the skin every 3 (three) days. 03/04/18   Leatha Gilding, MD    Allergies    Lisinopril  Review of Systems   Review of Systems  Unable to perform ROS: Mental status change    Physical Exam Updated Vital Signs BP (!) 156/89   Pulse 87   Temp 98.5 F (36.9 C) (Oral)   Resp (!) 21   Ht 1.651 m ('5\' 5"'$ )   Wt 100.4 kg   SpO2 99%   BMI 36.83 kg/m   Physical Exam Vitals and nursing note reviewed.  Constitutional:      Appearance: She is well-developed. She is ill-appearing.  HENT:     Head: Normocephalic and atraumatic.     Right Ear: External ear normal.     Left Ear: External ear normal.  Eyes:     General: No scleral icterus.       Right eye: No discharge.        Left eye: No discharge.     Conjunctiva/sclera: Conjunctivae normal.  Neck:     Trachea: No tracheal deviation.  Cardiovascular:     Rate and Rhythm: Normal rate and regular rhythm.  Pulmonary:     Effort: Pulmonary effort is normal. No respiratory distress.     Breath sounds: Normal breath sounds. No stridor. No wheezing or rales.  Abdominal:     General: Bowel sounds are normal. There is no distension.     Palpations: Abdomen is soft.     Tenderness: There is no abdominal tenderness. There is no guarding or rebound.  Musculoskeletal:        General: No tenderness.     Cervical back: Neck supple.     Comments: Palpable popliteal pulses bilaterally, unable to palpate dorsalis pedal or posterior tibial  pulses on either foot, extremities are warm bilaterally, evidence of ulcerative wounds on the left foot, fourth toe with evidence of cyanosis and breakdown of the soft tissue, ulcerative wound with dark eschar on the left heel  Skin:    General: Skin is warm and dry.     Findings: No rash.  Neurological:     Cranial Nerves: No cranial nerve deficit (no facial droop, extraocular movements intact, no slurred speech).     Sensory: No sensory deficit.     Motor: No abnormal muscle tone or seizure activity.     Coordination: Coordination normal.     Comments: Patient is nonverbal.  She does not follow commands, she does withdraw to pain         ED Results / Procedures / Treatments   Labs (all labs ordered are listed, but only abnormal results are displayed) Labs Reviewed  LACTIC ACID, PLASMA - Abnormal; Notable for the following components:      Result Value   Lactic Acid, Venous 2.0 (*)    All other components within normal limits  COMPREHENSIVE METABOLIC PANEL - Abnormal; Notable for the following components:   Sodium 128 (*)    Chloride 91 (*)    Glucose, Bld 144 (*)    Creatinine, Ser 1.06 (*)    Albumin 3.4 (*)    GFR calc non Af Amer 49 (*)    GFR calc Af Amer 57 (*)    All other components within normal limits  CBC WITH DIFFERENTIAL/PLATELET    EKG None  Radiology DG Foot Complete Left  Result Date: 11/16/2019 CLINICAL DATA:  Wounds on left heel.  Evaluate for osteomyelitis. EXAM: LEFT FOOT - COMPLETE 3+ VIEW COMPARISON:  None. FINDINGS: Marked hallux valgus deformity with secondary osteoarthritis. Vascular calcifications. Soft tissue irregularity superficial to the calcaneus on the lateral  view. No osseous destruction. No foreign body. No soft tissue gas. IMPRESSION: No plain film evidence of osteomyelitis. Skin/subcutaneous irregularity superficial to the calcaneus likely represents the patient's ulcer. Electronically Signed   By: Abigail Miyamoto M.D.   On: 11/16/2019  12:36   VAS [place] ABI WITH/WO TBI  Result Date: 11/16/2019 LOWER EXTREMITY DOPPLER STUDY Indications: Ulceration, and gangrene. High Risk Factors: Hypertension, hyperlipidemia, Diabetes. Other Factors: Dementia, in hospice care.  Limitations: Today's exam was limited due to involuntary patient movement and              Altered mental status. Comparison Study: No prior study on file for comparison. Performing Technologist: Sharion Dove RVS  Examination Guidelines: A complete evaluation includes at minimum, Doppler waveform signals and systolic blood pressure reading at the level of bilateral brachial, anterior tibial, and posterior tibial arteries, when vessel segments are accessible. Bilateral testing is considered an integral part of a complete examination. Photoelectric Plethysmograph (PPG) waveforms and toe systolic pressure readings are included as required and additional duplex testing as needed. Limited examinations for reoccurring indications may be performed as noted.  ABI Findings: +---------+------------------+-----+-----------+--------+ Right    Rt Pressure (mmHg)IndexWaveform   Comment  +---------+------------------+-----+-----------+--------+ Brachial 185                    multiphasic         +---------+------------------+-----+-----------+--------+ PTA      0                 0.00 absent              +---------+------------------+-----+-----------+--------+ DP       254               1.37 monophasic          +---------+------------------+-----+-----------+--------+ Great Toe67                0.36                     +---------+------------------+-----+-----------+--------+ +---------+------------------+-----+-------------------+-------+ Left     Lt Pressure (mmHg)IndexWaveform           Comment +---------+------------------+-----+-------------------+-------+ PTA      254               1.37 dampened monophasic         +---------+------------------+-----+-------------------+-------+ DP       93                0.50 dampened monophasic        +---------+------------------+-----+-------------------+-------+ Great Toe0                 0.00                            +---------+------------------+-----+-------------------+-------+ +-------+-----------+-----------+------------+------------+ ABI/TBIToday's ABIToday's TBIPrevious ABIPrevious TBI +-------+-----------+-----------+------------+------------+ Right  1.37       0.36                                +-------+-----------+-----------+------------+------------+ Left   1.37       0                                   +-------+-----------+-----------+------------+------------+  Summary: Right: Resting right ankle-brachial index indicates noncompressible right lower extremity arteries. The right toe-brachial index is abnormal. ABIs  are unreliable. Left: Resting left ankle-brachial index indicates noncompressible left lower extremity arteries. The left toe-brachial index is abnormal. ABIs are unreliable.  *See table(s) above for measurements and observations.     Preliminary    VAS [place] LOWER EXTREMITY ARTERIAL DUPLEX  Result Date: 11/16/2019 LOWER EXTREMITY ARTERIAL DUPLEX STUDY Indications: Gangrene. High Risk Factors: Hypertension, hyperlipidemia, Diabetes. Other Factors: Dementia, on Hospice care.  Current ABI: non compressible, abnormal toe pressure on the right, absent toe              pressure on the left Limitations: AMS, constant movement of left leg. Comparison Study: No prior study on file Performing Technologist: Sharion Dove RVS  Examination Guidelines: A complete evaluation includes B-mode imaging, spectral Doppler, color Doppler, and power Doppler as needed of all accessible portions of each vessel. Bilateral testing is considered an integral part of a complete examination. Limited examinations for reoccurring indications may be performed as  noted.  +----------+--------+-----+---------------+----------+--------+ LEFT      PSV cm/sRatioStenosis       Waveform  Comments +----------+--------+-----+---------------+----------+--------+ CFA Prox  101                         biphasic           +----------+--------+-----+---------------+----------+--------+ DFA       75                          biphasic           +----------+--------+-----+---------------+----------+--------+ SFA Prox  72                          biphasic           +----------+--------+-----+---------------+----------+--------+ SFA Mid   192          30-49% stenosis                   +----------+--------+-----+---------------+----------+--------+ SFA Distal51                          biphasic           +----------+--------+-----+---------------+----------+--------+ POP Prox  30                          monophasic         +----------+--------+-----+---------------+----------+--------+ POP Distal36                          monophasic         +----------+--------+-----+---------------+----------+--------+ ATA Mid   39                          monophasic         +----------+--------+-----+---------------+----------+--------+ ATA Distal47                          monophasic         +----------+--------+-----+---------------+----------+--------+ PTA Prox  21                          monophasic         +----------+--------+-----+---------------+----------+--------+ PTA Mid   64  monophasic         +----------+--------+-----+---------------+----------+--------+ PTA Distal59                          monophasic         +----------+--------+-----+---------------+----------+--------+  Summary: Left: 30-49% stenosis noted in the superficial femoral artery. Monophasic flow noted throughout the left popliteal, posterior tibial, and anterior tibial arteries. Arteries appear calcified throughout.   See table(s) above for measurements and observations.    Preliminary     Procedures Procedures (including critical care time)  Medications Ordered in ED Medications  sodium chloride 0.9 % bolus 500 mL (500 mLs Intravenous New Bag/Given 11/16/19 1149)    Followed by  0.9 %  sodium chloride infusion (has no administration in time range)  Ipratropium-Albuterol (COMBIVENT) respimat 1 puff (has no administration in time range)  albuterol (VENTOLIN HFA) 108 (90 Base) MCG/ACT inhaler 2 puff (has no administration in time range)  cefTRIAXone (ROCEPHIN) 1 g in sodium chloride 0.9 % 100 mL IVPB (0 g Intravenous Stopped 11/16/19 1219)    ED Course  I have reviewed the triage vital signs and the nursing notes.  Pertinent labs & imaging results that were available during my care of the patient were reviewed by me and considered in my medical decision making (see chart for details).  Clinical Course as of Nov 16 1542  Wynelle Link Nov 16, 2019  1349 Plain films do not show evidence of osteomyelitis   [JK]  1523 Discussed with Dr Darrick Penna.  Procedure pt would be a candidate for would be an Above the knee amputation.  Not a candidate for revaxcularization procedure.    [JK]  1541 Labs reviewed.  LActic slightly elevated but normal cbc no fever   [JK]    Clinical Course User Index [JK] Linwood Dibbles, MD   MDM Rules/Calculators/A&P                         Evidence of peripheral vascular disease and decreased perfusion on exam.  Patient's ED work-up is not showing signs of osteomyelitis.  No signs of cellulitis or deep space infection.  Palliative care did come and see the patient in the ED.  I have a long discussion with the patient's daughter about the etiology of her wounds.  I reassured patient's daughter that she was doing everything that she could.  Her wounds were no indication of her not being cared for properly.  I did discuss with the patient's daughter that unfortunately there is not a lot we can do to  improve her circulation.  Patient did have an ABI showing peripheral vascular disease.  I discussed the case with Dr. Darrick Penna vascular surgery and he indicates that patient would not be a candidate for revascularization procedure if anything an above-the-knee amputation could be considered.  I discussed these findings with the daughter.  I also explained to the daughter that it is likely her toe may indeed become darker and have worsening signs of perfusion.  We redressed the patient's wounds.  Plan on outpatient follow-up with vascular surgery.  I did write a prescription for pain meds to be used as needed  Ffinal Clinical Impression(s) / ED Diagnoses Final diagnoses:  Hyponatremia  Peripheral vascular disease (HCC)  Open toe wound, initial encounter    Rx / DC Orders ED Discharge Orders         Ordered    HYDROcodone-acetaminophen (NORCO/VICODIN) 5-325 MG tablet  Every 6 hours PRN     Discontinue  Reprint     11/16/19 1542           Dorie Rank, MD 11/16/19 1546

## 2020-02-19 DIAGNOSIS — B999 Unspecified infectious disease: Secondary | ICD-10-CM | POA: Diagnosis not present

## 2020-02-19 DIAGNOSIS — I43 Cardiomyopathy in diseases classified elsewhere: Secondary | ICD-10-CM | POA: Diagnosis not present

## 2020-04-03 DEATH — deceased

## 2020-08-30 IMAGING — CR DG FOOT COMPLETE 3+V*L*
3 series · 3 of 3 positions shown · non-contrast
Comparison: None.

CLINICAL DATA: Wounds on left heel.  Evaluate for osteomyelitis.

EXAM:
LEFT FOOT - COMPLETE 3+ VIEW

[foot ap]
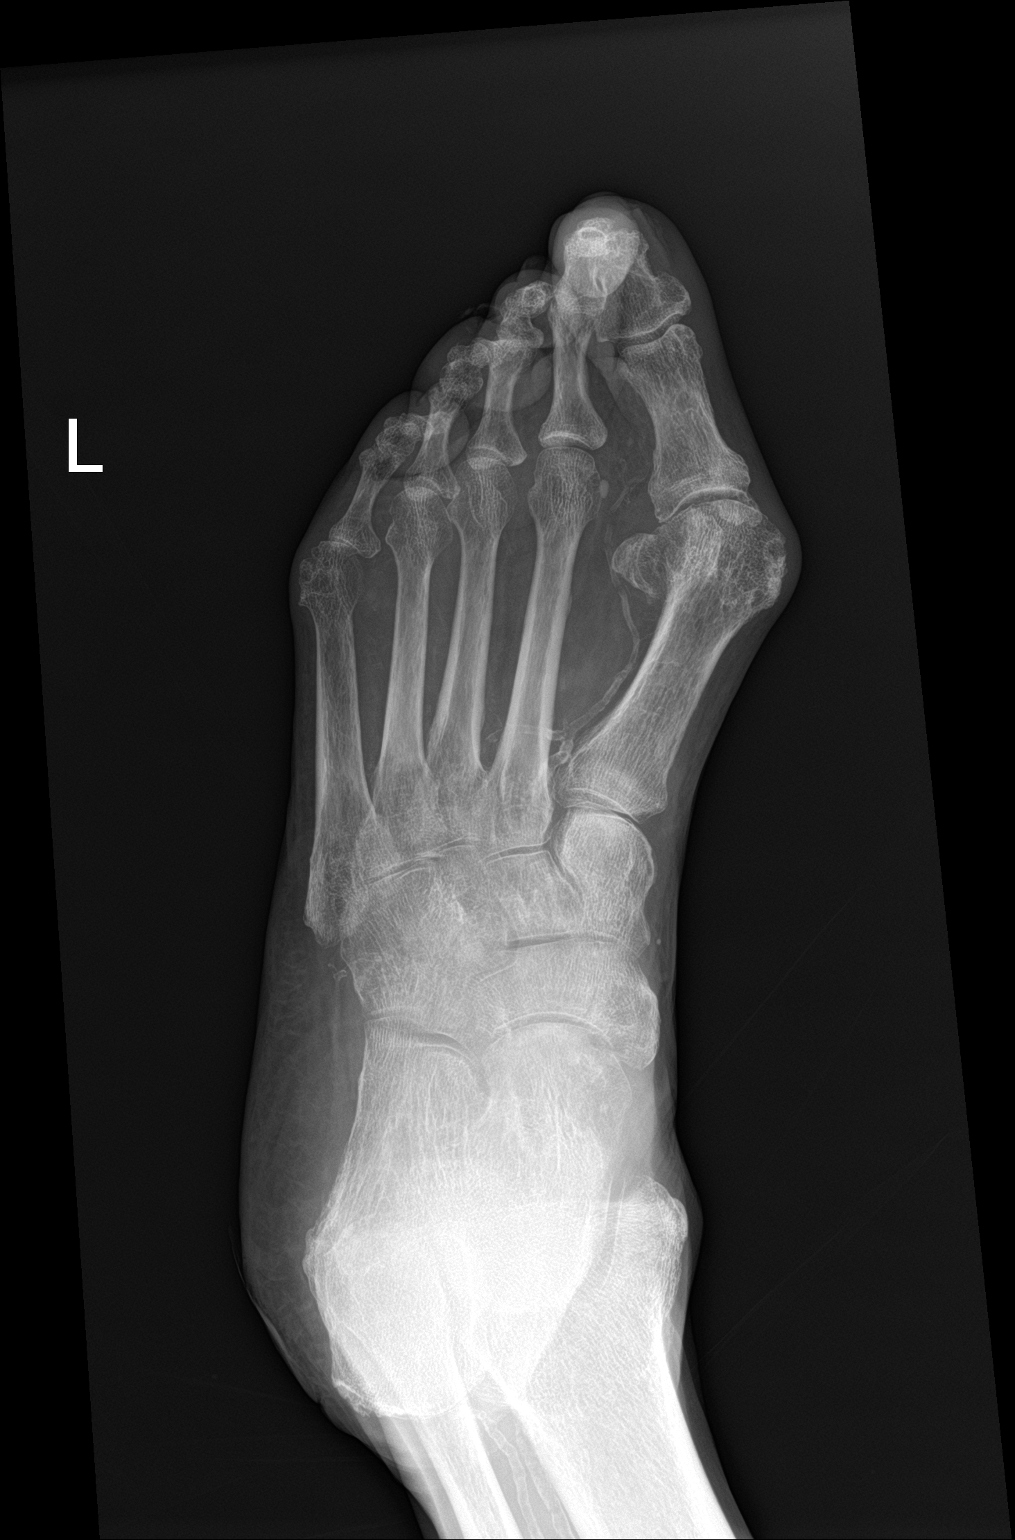

[foot obl]
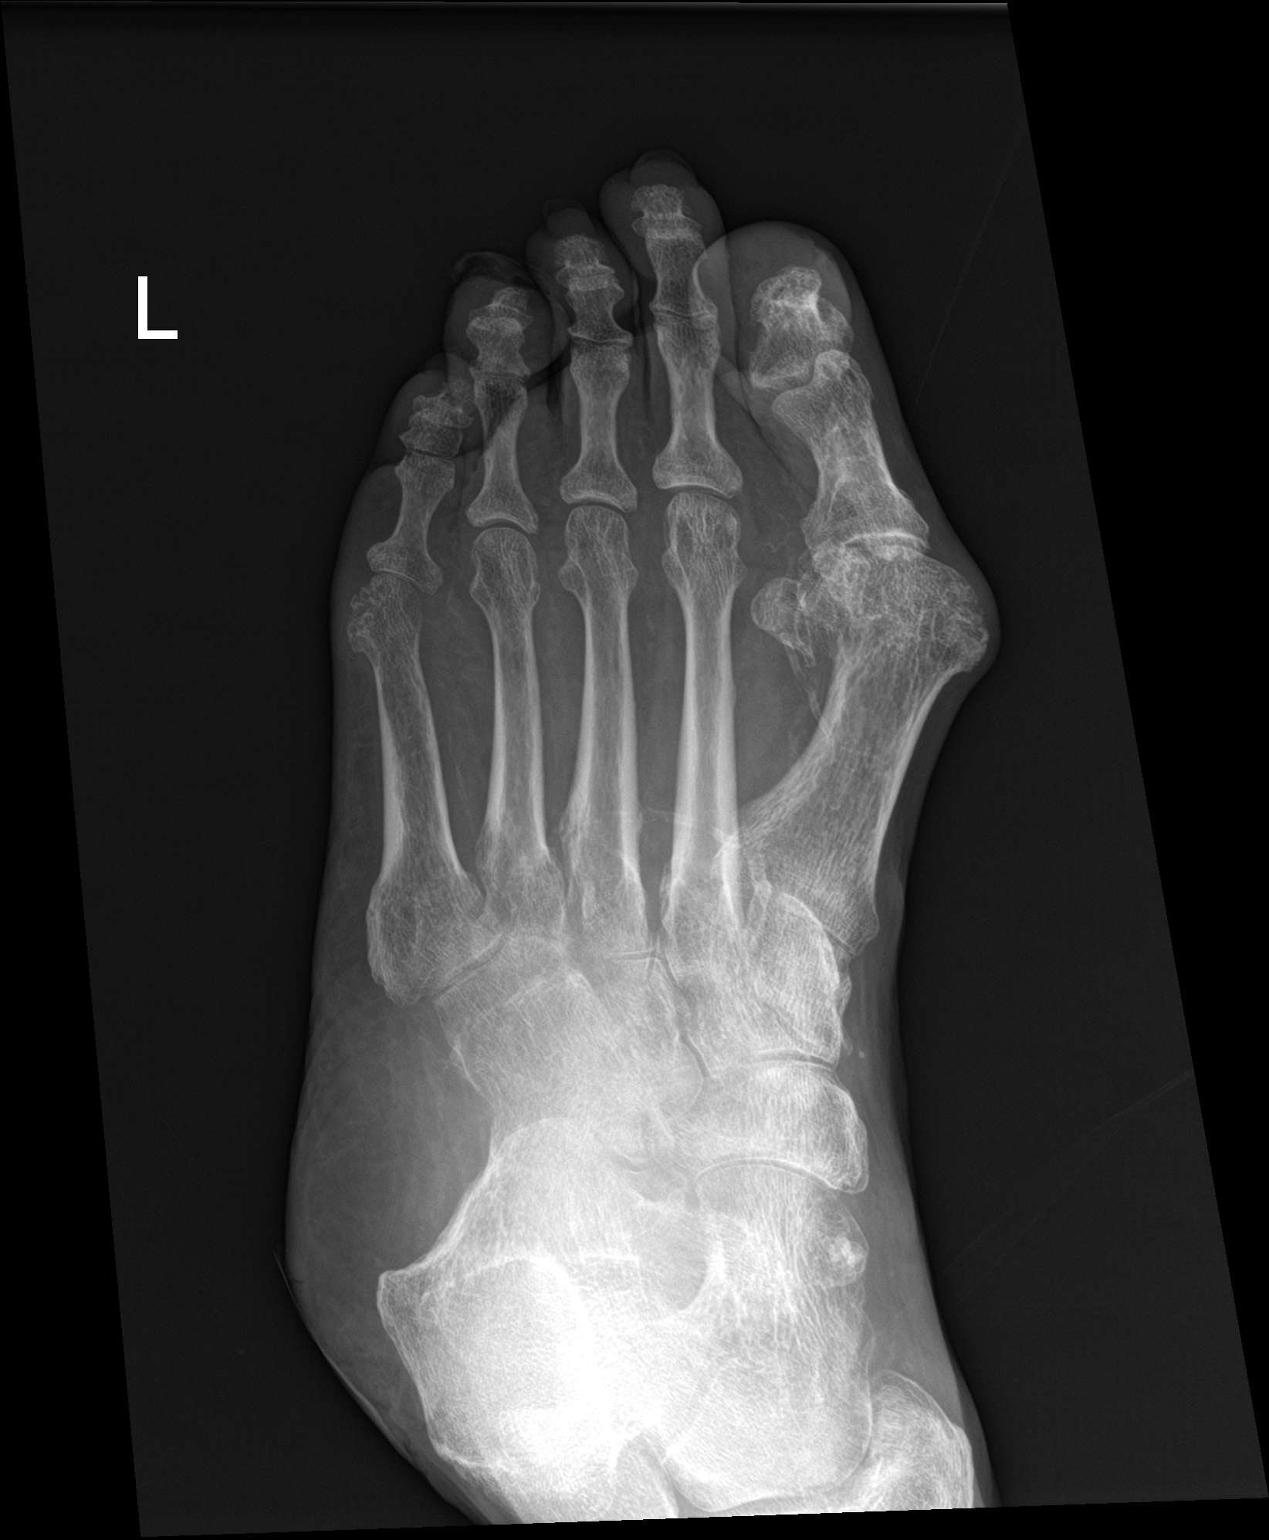

[foot lat]
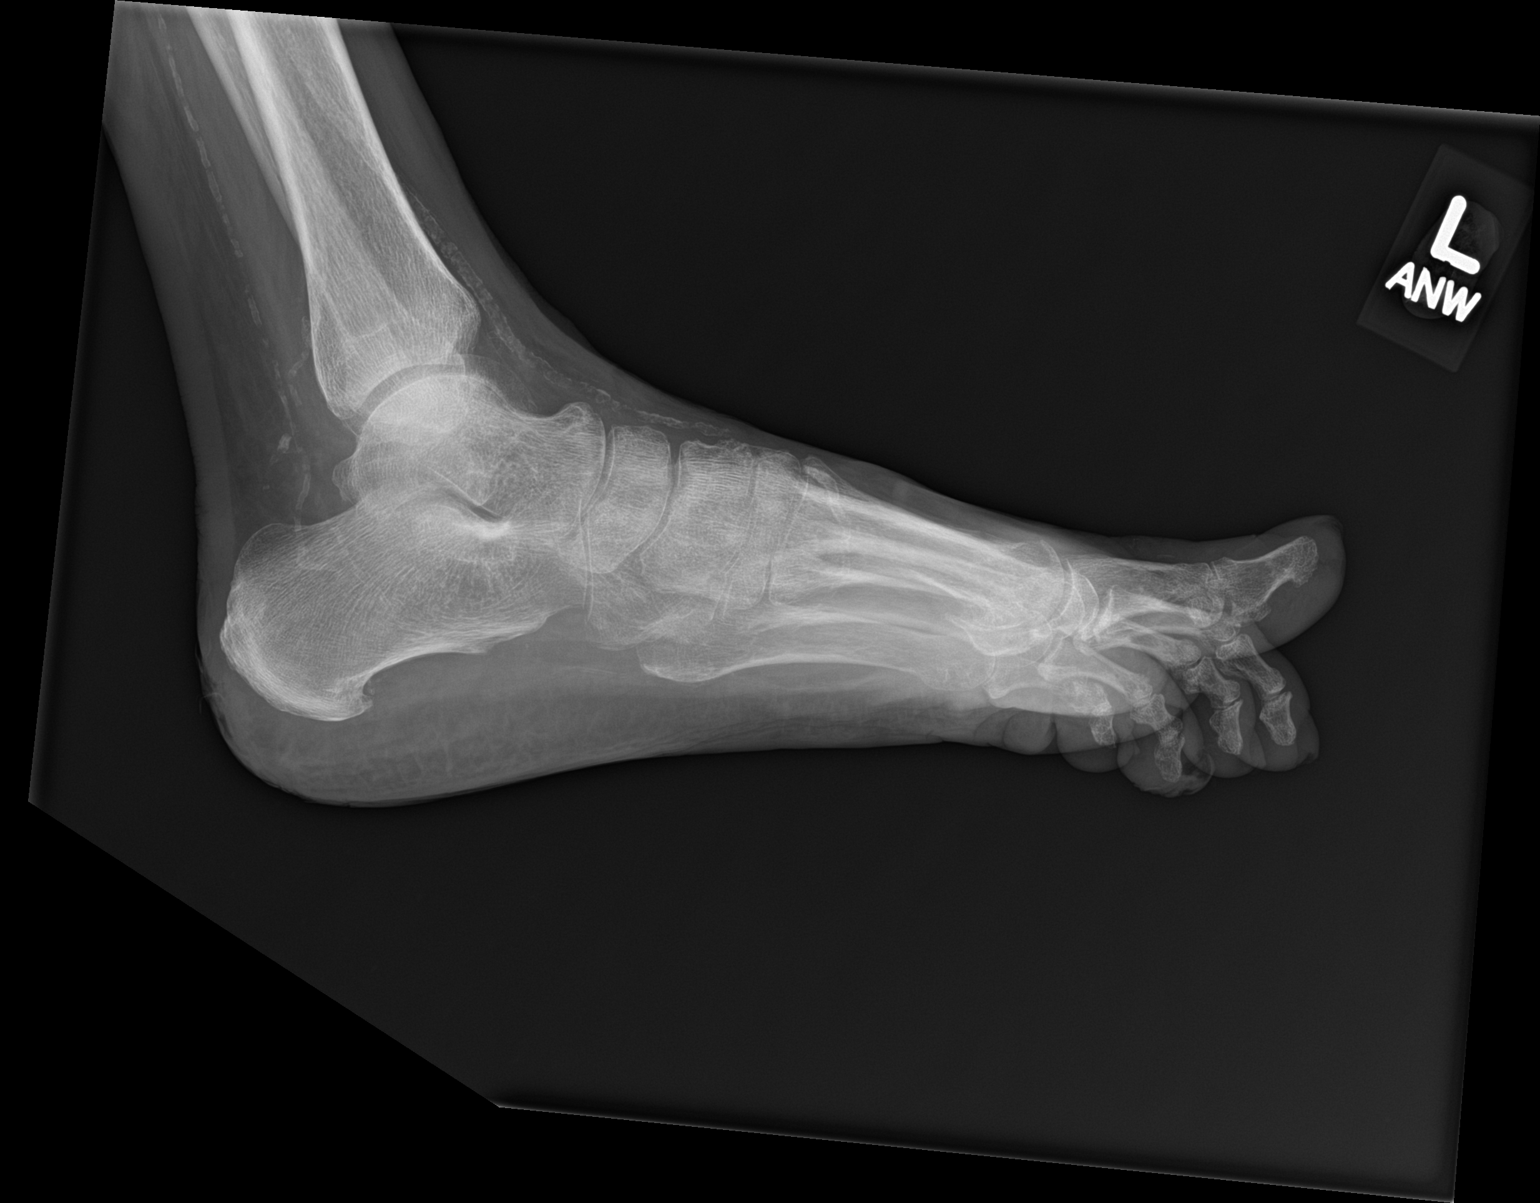

[3 of 3 positions shown; findings below may reference images not displayed]

FINDINGS: Marked hallux valgus deformity with secondary osteoarthritis.
Vascular calcifications. Soft tissue irregularity superficial to the
calcaneus on the lateral view. No osseous destruction. No foreign
body. No soft tissue gas.
IMPRESSION: No plain film evidence of osteomyelitis. Skin/subcutaneous
irregularity superficial to the calcaneus likely represents the
patient's ulcer.
